# Patient Record
Sex: Female | Born: 1955
Health system: Southern US, Community
[De-identification: ages and names within clinical notes are randomized; demographics above are authoritative.]

## PROBLEM LIST (undated history)

## (undated) DIAGNOSIS — I10 Essential (primary) hypertension: Secondary | ICD-10-CM

## (undated) DIAGNOSIS — C50919 Malignant neoplasm of unspecified site of unspecified female breast: Secondary | ICD-10-CM

## (undated) DIAGNOSIS — M1990 Unspecified osteoarthritis, unspecified site: Secondary | ICD-10-CM

## (undated) DIAGNOSIS — I509 Heart failure, unspecified: Secondary | ICD-10-CM

## (undated) HISTORY — DX: Malignant neoplasm of unspecified site of unspecified female breast: C50.919

## (undated) HISTORY — PX: APPENDECTOMY: SHX54

## (undated) HISTORY — PX: ABDOMINAL HYSTERECTOMY: SHX81

## (undated) HISTORY — PX: COSMETIC SURGERY: SHX468

---

## 1997-07-21 ENCOUNTER — Ambulatory Visit (HOSPITAL_BASED_OUTPATIENT_CLINIC_OR_DEPARTMENT_OTHER): Admission: RE | Admit: 1997-07-21 | Discharge: 1997-07-21 | Payer: Self-pay | Admitting: Otolaryngology

## 1997-08-19 ENCOUNTER — Ambulatory Visit (HOSPITAL_COMMUNITY): Admission: RE | Admit: 1997-08-19 | Discharge: 1997-08-19 | Payer: Self-pay | Admitting: General Surgery

## 1998-05-20 ENCOUNTER — Encounter: Admission: RE | Admit: 1998-05-20 | Discharge: 1998-07-06 | Payer: Self-pay | Admitting: *Deleted

## 1998-06-15 ENCOUNTER — Ambulatory Visit (HOSPITAL_COMMUNITY): Admission: RE | Admit: 1998-06-15 | Discharge: 1998-06-15 | Payer: Self-pay | Admitting: *Deleted

## 1998-07-29 ENCOUNTER — Ambulatory Visit (HOSPITAL_BASED_OUTPATIENT_CLINIC_OR_DEPARTMENT_OTHER): Admission: RE | Admit: 1998-07-29 | Discharge: 1998-07-29 | Payer: Self-pay | Admitting: Orthopedic Surgery

## 1998-08-11 ENCOUNTER — Encounter: Admission: RE | Admit: 1998-08-11 | Discharge: 1998-11-05 | Payer: Self-pay | Admitting: Orthopedic Surgery

## 2000-09-29 ENCOUNTER — Encounter: Payer: Self-pay | Admitting: Family Medicine

## 2000-09-29 ENCOUNTER — Ambulatory Visit (HOSPITAL_COMMUNITY): Admission: RE | Admit: 2000-09-29 | Discharge: 2000-09-29 | Payer: Self-pay | Admitting: Family Medicine

## 2000-12-18 ENCOUNTER — Ambulatory Visit (HOSPITAL_COMMUNITY): Admission: RE | Admit: 2000-12-18 | Discharge: 2000-12-18 | Payer: Self-pay | Admitting: Family Medicine

## 2000-12-18 ENCOUNTER — Encounter: Payer: Self-pay | Admitting: Family Medicine

## 2001-03-13 ENCOUNTER — Other Ambulatory Visit: Admission: RE | Admit: 2001-03-13 | Discharge: 2001-03-13 | Payer: Self-pay | Admitting: *Deleted

## 2001-03-15 ENCOUNTER — Encounter: Payer: Self-pay | Admitting: *Deleted

## 2001-03-15 ENCOUNTER — Encounter: Admission: RE | Admit: 2001-03-15 | Discharge: 2001-03-15 | Payer: Self-pay | Admitting: *Deleted

## 2001-03-19 ENCOUNTER — Encounter: Admission: RE | Admit: 2001-03-19 | Discharge: 2001-03-19 | Payer: Self-pay | Admitting: Family Medicine

## 2001-03-19 ENCOUNTER — Encounter: Payer: Self-pay | Admitting: Family Medicine

## 2001-09-11 ENCOUNTER — Encounter: Payer: Self-pay | Admitting: Emergency Medicine

## 2001-09-11 ENCOUNTER — Emergency Department (HOSPITAL_COMMUNITY): Admission: EM | Admit: 2001-09-11 | Discharge: 2001-09-11 | Payer: Self-pay | Admitting: Emergency Medicine

## 2002-01-31 ENCOUNTER — Encounter: Admission: RE | Admit: 2002-01-31 | Discharge: 2002-01-31 | Payer: Self-pay | Admitting: Family Medicine

## 2002-01-31 ENCOUNTER — Encounter: Payer: Self-pay | Admitting: Family Medicine

## 2003-05-20 ENCOUNTER — Emergency Department (HOSPITAL_COMMUNITY): Admission: EM | Admit: 2003-05-20 | Discharge: 2003-05-20 | Payer: Self-pay | Admitting: Emergency Medicine

## 2003-06-13 ENCOUNTER — Other Ambulatory Visit: Admission: RE | Admit: 2003-06-13 | Discharge: 2003-06-13 | Payer: Self-pay | Admitting: *Deleted

## 2003-07-28 ENCOUNTER — Inpatient Hospital Stay (HOSPITAL_COMMUNITY): Admission: RE | Admit: 2003-07-28 | Discharge: 2003-07-30 | Payer: Self-pay | Admitting: Obstetrics and Gynecology

## 2003-09-18 ENCOUNTER — Ambulatory Visit (HOSPITAL_COMMUNITY): Admission: RE | Admit: 2003-09-18 | Discharge: 2003-09-18 | Payer: Self-pay | Admitting: General Surgery

## 2003-09-19 ENCOUNTER — Ambulatory Visit (HOSPITAL_COMMUNITY): Admission: RE | Admit: 2003-09-19 | Discharge: 2003-09-19 | Payer: Self-pay | Admitting: General Surgery

## 2003-10-08 ENCOUNTER — Encounter: Admission: RE | Admit: 2003-10-08 | Discharge: 2003-10-08 | Payer: Self-pay | Admitting: Obstetrics and Gynecology

## 2010-01-31 ENCOUNTER — Encounter: Payer: Self-pay | Admitting: Obstetrics and Gynecology

## 2010-01-31 ENCOUNTER — Encounter: Payer: Self-pay | Admitting: Urology

## 2010-09-17 ENCOUNTER — Emergency Department (HOSPITAL_BASED_OUTPATIENT_CLINIC_OR_DEPARTMENT_OTHER)
Admission: EM | Admit: 2010-09-17 | Discharge: 2010-09-17 | Disposition: A | Discharge status: Home / Self Care | Payer: 59 | Attending: Emergency Medicine | Admitting: Emergency Medicine

## 2010-09-17 ENCOUNTER — Encounter: Payer: Self-pay | Admitting: *Deleted

## 2010-09-17 DIAGNOSIS — H00019 Hordeolum externum unspecified eye, unspecified eyelid: Secondary | ICD-10-CM | POA: Insufficient documentation

## 2010-09-17 DIAGNOSIS — I10 Essential (primary) hypertension: Secondary | ICD-10-CM | POA: Insufficient documentation

## 2010-09-17 HISTORY — DX: Essential (primary) hypertension: I10

## 2010-09-17 NOTE — ED Provider Notes (Signed)
History     CSN: [id number] Arrival date & time: 09/17/2010 11:32 AM  Chief Complaint  Patient presents with  . Stye   HPI Comments: Pt states that she used avon makeup and had swelling to both eyes:pt states that she then noticed swelling to the right eye  Patient is a 55 y.o. female presenting with eye problem. The history is provided by the patient. No language interpreter was used.  Eye Problem  This is a new problem. The current episode started more than 1 week ago. The problem occurs constantly. The problem has been rapidly improving. There is pain in the right eye. The pain is mild. There is no history of trauma to the eye. There is no known exposure to pink eye. She does not wear contacts. Pertinent negatives include no blurred vision and no discharge. She has tried eye drops for the symptoms.    Past Medical History  Diagnosis Date  . Hypertension     Past Surgical History  Procedure Date  . Abdominal hysterectomy   . Appendectomy   . Cosmetic surgery     mass removed from shoulder/scapula area    History reviewed. No pertinent family history.  History  Substance Use Topics  . Smoking status: Never Smoker   . Smokeless tobacco: Never Used  . Alcohol Use: Yes     ocially    OB History    Grav Para Term Preterm Abortions TAB SAB Ect Mult Living                  Review of Systems  Eyes: Negative for blurred vision and discharge.  All other systems reviewed and are negative.    Physical Exam  There were no vitals taken for this visit.  Physical Exam  Nursing note and vitals reviewed. Constitutional: She appears well-developed and well-nourished.  HENT:  Head: Normocephalic and atraumatic.  Left Ear: External ear normal.  Eyes: Conjunctivae and EOM are normal. Pupils are equal, round, and reactive to light.    Cardiovascular: Normal rate and regular rhythm.   Pulmonary/Chest: Effort normal and breath sounds normal.  Musculoskeletal: Normal range of  motion.    ED Course  Procedures  MDM On exam pt has a stye:no antibiotic needed at this time      Teressa Lower, NP 09/17/10 1225

## 2010-09-17 NOTE — ED Provider Notes (Signed)
Medical screening examination/treatment/procedure(s) were performed by non-physician practitioner and as supervising physician I was immediately available for consultation/collaboration.  Cyndra Numbers, MD 09/17/10 2023

## 2010-09-17 NOTE — ED Notes (Signed)
Patient states she has a sty in both eyes but the the right eye is not getting better like the left eye did. Patient used a wrinkle cream from [place] around her eyes before this started.

## 2013-09-30 ENCOUNTER — Encounter (HOSPITAL_COMMUNITY): Payer: Self-pay | Admitting: Emergency Medicine

## 2013-09-30 ENCOUNTER — Emergency Department (HOSPITAL_COMMUNITY)
Admission: EM | Admit: 2013-09-30 | Discharge: 2013-09-30 | Disposition: A | Discharge status: Home / Self Care | Payer: 59 | Source: Home / Self Care | Attending: Family Medicine | Admitting: Family Medicine

## 2013-09-30 DIAGNOSIS — J309 Allergic rhinitis, unspecified: Secondary | ICD-10-CM

## 2013-09-30 DIAGNOSIS — J45901 Unspecified asthma with (acute) exacerbation: Secondary | ICD-10-CM

## 2013-09-30 DIAGNOSIS — R061 Stridor: Secondary | ICD-10-CM

## 2013-09-30 MED ORDER — ALBUTEROL SULFATE (2.5 MG/3ML) 0.083% IN NEBU
INHALATION_SOLUTION | RESPIRATORY_TRACT | Status: AC
  Filled 2013-09-30: qty 3

## 2013-09-30 MED ORDER — PREDNISONE 50 MG PO TABS: ORAL_TABLET | ORAL | Status: DC

## 2013-09-30 MED ORDER — FLUTICASONE PROPIONATE 50 MCG/ACT NA SUSP: 2.0000 | Freq: Every day | NASAL | Status: DC

## 2013-09-30 MED ORDER — METHYLPREDNISOLONE SODIUM SUCC 125 MG IJ SOLR
125.0000 mg | Freq: Once | INTRAMUSCULAR | Status: AC
  Administered 2013-09-30: 125 mg via INTRAMUSCULAR

## 2013-09-30 MED ORDER — IPRATROPIUM-ALBUTEROL 0.5-2.5 (3) MG/3ML IN SOLN
RESPIRATORY_TRACT | Status: AC
  Filled 2013-09-30: qty 3

## 2013-09-30 MED ORDER — SPACER/AERO-HOLDING CHAMBERS DEVI: Status: AC

## 2013-09-30 MED ORDER — PREDNISONE 50 MG PO TABS
ORAL_TABLET | ORAL | Status: DC
Start: 2013-09-30 — End: 2020-05-20

## 2013-09-30 MED ORDER — ALBUTEROL SULFATE HFA 108 (90 BASE) MCG/ACT IN AERS: 2.0000 | INHALATION_SPRAY | RESPIRATORY_TRACT | Status: DC | PRN

## 2013-09-30 MED ORDER — METHYLPREDNISOLONE SODIUM SUCC 125 MG IJ SOLR
INTRAMUSCULAR | Status: AC
  Filled 2013-09-30: qty 2

## 2013-09-30 MED ORDER — ALBUTEROL SULFATE (2.5 MG/3ML) 0.083% IN NEBU
2.5000 mg | INHALATION_SOLUTION | Freq: Once | RESPIRATORY_TRACT | Status: AC
  Administered 2013-09-30: 2.5 mg via RESPIRATORY_TRACT

## 2013-09-30 MED ORDER — SPACER/AERO-HOLDING CHAMBERS DEVI
Status: DC
Start: 2013-09-30 — End: 2013-09-30

## 2013-09-30 MED ORDER — IPRATROPIUM-ALBUTEROL 0.5-2.5 (3) MG/3ML IN SOLN
3.0000 mL | Freq: Once | RESPIRATORY_TRACT | Status: AC
  Administered 2013-09-30: 3 mL via RESPIRATORY_TRACT

## 2013-09-30 NOTE — ED Notes (Signed)
Reports being around a dog over the weekend, which she has an allergy to animals.  States that Saturday she started with a cough and having headaches.  Mild sob.  No hx of asthma.

## 2013-09-30 NOTE — Discharge Instructions (Signed)
You likely are suffering from an asthma exacerbation or where your lungs are clamping down due to spasm of your airway from the allergens you were exposed too.  You were given a breathing treatment and steroids to help Please continue taking the steroids at home until they are gone Use the albuterol every 4 hours for the next 24-48 hours Please also start using nasal saline and flonase and an over the counter allergy medication such as zyrtec.  Please come back if you are not better.

## 2013-09-30 NOTE — ED Provider Notes (Addendum)
CSN: [id number]     Arrival date & time 09/30/13  7829 History   First MD Initiated Contact with Patient 09/30/13 1009     Chief Complaint  Patient presents with  . Allergies  . Shortness of Breath   (Consider location/radiation/quality/duration/timing/severity/associated sxs/prior Treatment) HPI  Went to friends house w/ dogs and possible mold. Stayed overnight. Started coughing at that time and has not stopped since. Getting worse. alkazeltzer cougha and allergy w/o much improvement. Feels like there is a tighness in her anterior neck that is stopping her from breathing. Associated w/ HA, and chills, and cough, adn post nasal drip. . Denies CP, palpitqations, syncope. Fever, runny nose.    HTN: did not take BP meds this am. Jimmy Picket stressed. Denies CP, palpitations.    Past Medical History  Diagnosis Date  . Hypertension    Past Surgical History  Procedure Laterality Date  . Abdominal hysterectomy    . Appendectomy    . Cosmetic surgery      mass removed from shoulder/scapula area   History reviewed. No pertinent family history. History  Substance Use Topics  . Smoking status: Never Smoker   . Smokeless tobacco: Never Used  . Alcohol Use: Yes     Comment: ocially   OB History   Grav Para Term Preterm Abortions TAB SAB Ect Mult Living                 Review of Systems Per HPI with all other pertinent systems negative.    Allergies  Penicillins  Home Medications   Prior to Admission medications   Medication Sig Start Date End Date Taking? Authorizing Provider  amLODipine (NORVASC) 10 MG tablet Take 10 mg by mouth daily.     Yes Historical Provider, MD  cloNIDine (CATAPRES) 0.1 MG tablet Take 0.1 mg by mouth 2 (two) times daily.     Yes Historical Provider, MD  fish oil-omega-3 fatty acids 1000 MG capsule Take 2 g by mouth daily.     Yes Historical Provider, MD  hydrochlorothiazide 25 MG tablet Take 25 mg by mouth daily.     Yes Historical Provider, MD   lisinopril (PRINIVIL,ZESTRIL) 40 MG tablet Take 40 mg by mouth 2 (two) times daily.     Yes Historical Provider, MD  vitamin C (ASCORBIC ACID) 250 MG tablet Take 250 mg by mouth daily.     Yes Historical Provider, MD  albuterol (PROVENTIL HFA;VENTOLIN HFA) 108 (90 BASE) MCG/ACT inhaler Inhale 2 puffs into the lungs every 4 (four) hours as needed for wheezing or shortness of breath. 09/30/13   Waldemar Dickens, MD  fluticasone (FLONASE) 50 MCG/ACT nasal spray Place 2 sprays into both nostrils daily. 09/30/13   Waldemar Dickens, MD  predniSONE (DELTASONE) 50 MG tablet Take daily with breakfast 09/30/13   Waldemar Dickens, MD  Spacer/Aero-Holding Josiah Lobo DEVI Use as directed 09/30/13   Waldemar Dickens, MD  traMADol Veatrice Bourbon) 50 MG tablet Take 50 mg by mouth every 6 (six) hours as needed.      Historical Provider, MD   BP 138/82  Pulse 86  Temp(Src) 98.3 F (36.8 C) (Oral)  Resp 20  SpO2 100% Physical Exam  Constitutional: She is oriented to person, place, and time. She appears well-developed and well-nourished. She appears distressed (mildly ).  HENT:  Head: Normocephalic and atraumatic.  Eyes: Pupils are equal, round, and reactive to light.  Neck: Normal range of motion.  Cardiovascular: Normal rate and intact distal pulses.  II//VI stystolic murmur  Pulmonary/Chest:  Mild increased WOB.  End exp bilat wheezing Decreased air movement Significant stridor on neck ascultation  Significant Improvement after duoneb and solumedrol  Abdominal: Soft. Bowel sounds are normal.  Musculoskeletal: Normal range of motion. She exhibits no tenderness.  Neurological: She is alert and oriented to person, place, and time.  Skin: Skin is warm. She is not diaphoretic.  Psychiatric: She has a normal mood and affect. Her behavior is normal.    ED Course  Procedures (including critical care time) Labs Review Labs Reviewed - No data to display  Imaging Review No results found.   MDM   1. Asthma,  unspecified asthma severity, with acute exacerbation   2. Multiple respiratory allergies   3. Stridor    Pt w/ significant reactive airway response to likely pet dander and other possible environmental exposures causing current eipsode. No h/o asthma Solumedrol 125mg  IM and duoneb w/ significant improvement in overall condition Start prednisone 50 x5 days Albuterol inh and spacer Q4 x 24-48 hrs then PRN.  Precautions given and all questions answered  Pt to go to ED if not feeling any better in 1-3 hrs.   HTN: at goal today. Continue current regimen  Linna Darner, MD Family Medicine 09/30/2013, 10:49 AM       Waldemar Dickens, MD 09/30/13 1131

## 2014-04-10 ENCOUNTER — Encounter: Payer: Self-pay | Admitting: Nurse Practitioner

## 2014-04-21 ENCOUNTER — Encounter: Payer: Self-pay | Admitting: *Deleted

## 2014-04-23 ENCOUNTER — Encounter: Payer: Self-pay | Admitting: *Deleted

## 2014-04-23 ENCOUNTER — Ambulatory Visit: Payer: 59 | Admitting: Nurse Practitioner

## 2014-04-23 NOTE — Progress Notes (Signed)
Patient ID: Ashley Warren, female   DOB: 12-25-1955, 59 y.o.   MRN: [id number] Called patient today, 04-23-2014 to advise she did not show for her appointment today at 8:30 am.  I left our number and advised she call us if she wants to reschedule.

## 2015-01-21 MED FILL — traMADol HCL 50 MG TABS: 50 | 30 days supply | Qty: 90 | Fill #0

## 2015-01-21 MED FILL — cloNIDine HCL 0.1 MG TABS: 0.1 | 30 days supply | Qty: 120 | Fill #0

## 2015-02-09 MED FILL — VENTOLIN HFA 90 MCG INHALER: 108 (90 BAS | 25 days supply | Qty: 18 | Fill #0

## 2015-02-16 MED FILL — BENZONATATE 100 MG CAPSULE: 100 | 7 days supply | Qty: 30 | Fill #0

## 2015-02-18 MED FILL — traMADol HCL 50 MG TABS: 50 | 90 days supply | Qty: 270 | Fill #0

## 2015-02-26 MED FILL — cloNIDine HCL 0.1 MG TABS: 0.1 | 30 days supply | Qty: 120 | Fill #1

## 2015-03-04 ENCOUNTER — Encounter: Payer: 59 | Admitting: Physical Medicine & Rehabilitation

## 2015-03-05 MED FILL — LISINOPRIL 40 MG TABLET: 40 | 90 days supply | Qty: 180 | Fill #3

## 2015-03-10 MED FILL — TRIAMCINOLONE 0.1% CREAM: 0.1 | 5 days supply | Qty: 15 | Fill #0

## 2015-03-13 MED FILL — VENTOLIN HFA 90 MCG INHALER: 108 (90 BAS | 25 days supply | Qty: 18 | Fill #0

## 2015-03-20 DIAGNOSIS — I10 Essential (primary) hypertension: Secondary | ICD-10-CM | POA: Diagnosis not present

## 2015-03-20 DIAGNOSIS — J4521 Mild intermittent asthma with (acute) exacerbation: Secondary | ICD-10-CM | POA: Diagnosis not present

## 2015-03-20 MED FILL — PANTOPRAZOLE SOD DR 40 MG T: 40 | 90 days supply | Qty: 90 | Fill #2

## 2015-03-20 MED FILL — AZITHROMYCIN 250 MG TABLET: 250 | 5 days supply | Qty: 6 | Fill #0

## 2015-03-20 MED FILL — AMLODIPINE BESYLATE 10 MG T: 10 | 90 days supply | Qty: 90 | Fill #2

## 2015-03-20 MED FILL — predniSONE 50 MG TABS: 50 | 5 days supply | Qty: 5 | Fill #0

## 2015-03-20 MED FILL — SYMBICORT 160-4.5 MCG INH: 160-4.5 | 30 days supply | Qty: 10 | Fill #0

## 2015-03-24 MED FILL — HYDROCHLOROTHIAZIDE 25 MG T: 25 | 90 days supply | Qty: 90 | Fill #0

## 2015-03-30 MED FILL — cloNIDine HCL 0.1 MG TABS: 0.1 | 30 days supply | Qty: 120 | Fill #2

## 2015-04-08 ENCOUNTER — Ambulatory Visit: Payer: 59 | Admitting: Physical Medicine & Rehabilitation

## 2015-04-29 ENCOUNTER — Encounter: Payer: 59 | Attending: Physical Medicine & Rehabilitation | Admitting: Physical Medicine & Rehabilitation

## 2015-04-29 ENCOUNTER — Encounter: Payer: Self-pay | Admitting: Physical Medicine & Rehabilitation

## 2015-04-29 VITALS — BP 158/82 | HR 71 | Resp 14 | Ht 64.0 in | Wt 300.0 lb

## 2015-04-29 DIAGNOSIS — M7061 Trochanteric bursitis, right hip: Secondary | ICD-10-CM | POA: Diagnosis not present

## 2015-04-29 DIAGNOSIS — M545 Low back pain, unspecified: Secondary | ICD-10-CM | POA: Insufficient documentation

## 2015-04-29 DIAGNOSIS — M1288 Other specific arthropathies, not elsewhere classified, other specified site: Secondary | ICD-10-CM | POA: Diagnosis not present

## 2015-04-29 DIAGNOSIS — G894 Chronic pain syndrome: Secondary | ICD-10-CM | POA: Diagnosis not present

## 2015-04-29 DIAGNOSIS — Z5181 Encounter for therapeutic drug level monitoring: Secondary | ICD-10-CM | POA: Diagnosis not present

## 2015-04-29 DIAGNOSIS — M47816 Spondylosis without myelopathy or radiculopathy, lumbar region: Secondary | ICD-10-CM

## 2015-04-29 DIAGNOSIS — Z79899 Other long term (current) drug therapy: Secondary | ICD-10-CM

## 2015-04-29 DIAGNOSIS — M7062 Trochanteric bursitis, left hip: Secondary | ICD-10-CM | POA: Diagnosis not present

## 2015-04-29 DIAGNOSIS — I10 Essential (primary) hypertension: Secondary | ICD-10-CM | POA: Insufficient documentation

## 2015-04-29 NOTE — Patient Instructions (Signed)
  PLEASE CALL ME WITH ANY PROBLEMS OR QUESTIONS (#[phone]).      

## 2015-04-29 NOTE — Progress Notes (Signed)
Subjective:    Patient ID: Ashley Warren, female    DOB: Oct 07, 1955, 60 y.o.   MRN: [id number]  HPI   This is an initial visit for Ashley Warren who presents today with low back pain. She is a morbidly obese 60 yo female who reports developing low back pain about 2+ years ago. She doesn't recall any specific incident or accident. Her pain first presented on her knees which responded to exercise. She went to her family doctor who prescribed robaxin for spasms. She was also given tramadol which helps when she uses it with ibuprofen. This helps 75% of her pain.   Her pain is worst when she stands or if she sits for a long period of times. She feels like it's a  "muscle" type of feeling.  The pain is in the central low back with radiation into both thighs particularly in the posterior thighs. Walking or moving seem to help the pain. Dancing helps as well! She likes to wear high heels at times too!    She likes to dance. 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    Pain Inventory Average Pain 10 Pain Right Now 10 My pain is intermittent, constant, burning and aching  In the last 24 hours, has pain interfered with the following? General activity 10 Relation with others 10 Enjoyment of life 10 What TIME of day is your pain at its worst? all Sleep (in general) Good  Pain is worse with: walking, bending, sitting, standing and some activites Pain improves with: rest, therapy/exercise, pacing activities and medication Relief from Meds: 6  Mobility walk without assistance how many minutes can you walk? 30 ability to climb steps?  yes do you drive?  yes transfers alone Do you have any goals in this area?  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  yes  Neuro/Psych tingling spasms  Prior Studies New  Physicians  involved in your care New   Family History  Problem Relation Age of Onset  . Alcohol abuse Maternal Grandmother   . Autism spectrum disorder Sister   . Breast cancer Mother   . Depression Sister   . Hypertension Mother   . Hypertension Sister   . Osteoarthritis Maternal Grandmother    Social History   Social History  . Marital Status: Married    Spouse Name: N/A  . Number of Children: N/A  . Years of Education: N/A   Social History Main Topics  . Smoking status: Never Smoker   . Smokeless tobacco: Never Used  . Alcohol Use: Yes     Comment: ocially  . Drug Use: No  . Sexual Activity: Not Asked   Other Topics Concern  . None   Social History Narrative   Past Surgical History  Procedure Laterality Date  . Abdominal hysterectomy    . Appendectomy    . Cosmetic surgery      mass removed from shoulder/scapula area   Past Medical History  Diagnosis Date  . Hypertension    BP 158/82 mmHg  Pulse 71  Resp 14  SpO2 97%  Opioid Risk Score:   Fall Risk Score:  `1  Depression screen PHQ 2/9  Depression screen PHQ 2/9 04/29/2015  Decreased Interest 0  Down, Depressed, Hopeless 0  PHQ - 2 Score 0  Altered sleeping 0  Tired, decreased energy 0  Change in appetite 0  Feeling bad or failure about yourself  0  Trouble concentrating 0  Moving slowly or fidgety/restless 0  Suicidal thoughts 0  PHQ-9 Score 0  Difficult doing work/chores (No Data)     Review of Systems  Gastrointestinal: Positive for constipation.  Endocrine:       High blood sugar   All other systems reviewed and are negative.      Objective:   Physical Exam  General: Alert and oriented x 3, No apparent distress. Morbidly obese HEENT: Head is normocephalic, atraumatic, PERRLA, EOMI, sclera anicteric, oral mucosa pink and moist, dentition intact, ext ear canals clear,  Neck: Supple without JVD or lymphadenopathy Heart: Reg rate and rhythm. No murmurs rubs or gallops Chest: CTA  bilaterally without wheezes, rales, or rhonchi; no distress Abdomen: Soft, non-tender, non-distended, bowel sounds positive. Extremities: No clubbing, cyanosis, or edema. Pulses are 2+ Skin: Clean and intact without signs of breakdown  Neuro: Pt is cognitively appropriate with normal insight, memory, and awareness. Cranial nerves 2-12 are intact. Sensory exam is normal. Reflexes are 2+ in all 4's. Fine motor coordination is intact. No tremors. Motor function is grossly 5/5. Gait is fairly normal.  Musculoskeletal: Full ROM, No pain with AROM or PROM in the neck, trunk, or extremities. Posture fairly normal. Does have a slightly exaggerated lumbar lordosis. Low back minimally tender with flexion, rotation or side bending. Extension only caused minimal pain. She had pain which re-produced symptoms with facet provocation. Both greater trochs were tender.  Psych: Pt's affect is appropriate. Pt is cooperative           1. Low back pain most consistent with Lumbar Facet Arthropathy 2. Morbid obesity 3. Bilateral greater trochanter bursitis   Plan: 1. Lumbar xrays to assess facets and disc spaces. 2. Consider referral to PT for facet based exercises/HEP. Basic facet stretches were provided today. 3. Continue ibuprofen--may try taking TID up 2400mg  daily for the short term with food 4. Continue tramadol prn up to 200mg  daily 5. DC robaxin as she's not using 6. Follow up with me in about a month. Forty-five minutes of face to face patient care time were spent during this visit. All questions were encouraged and answered.

## 2015-05-04 MED FILL — cloNIDine HCL 0.1 MG TABS: 0.1 | 90 days supply | Qty: 360 | Fill #0

## 2015-05-05 LAB — TOXASSURE SELECT,+ANTIDEPR,UR: PDF: 0

## 2015-05-07 NOTE — Progress Notes (Signed)
Urine drug screen for this encounter is consistent for prescribed medication 

## 2015-05-15 MED FILL — KLOR-CON M20 TABLET: 20 | 90 days supply | Qty: 180 | Fill #0

## 2015-05-21 MED FILL — traMADol HCL 50 MG TABS: 50 | 90 days supply | Qty: 270 | Fill #0

## 2015-05-26 ENCOUNTER — Encounter: Payer: 59 | Attending: Physical Medicine & Rehabilitation | Admitting: Physical Medicine & Rehabilitation

## 2015-05-26 DIAGNOSIS — I10 Essential (primary) hypertension: Secondary | ICD-10-CM | POA: Insufficient documentation

## 2015-05-26 DIAGNOSIS — M7062 Trochanteric bursitis, left hip: Secondary | ICD-10-CM | POA: Insufficient documentation

## 2015-05-26 DIAGNOSIS — M7061 Trochanteric bursitis, right hip: Secondary | ICD-10-CM | POA: Insufficient documentation

## 2015-05-26 DIAGNOSIS — M545 Low back pain: Secondary | ICD-10-CM | POA: Insufficient documentation

## 2015-05-26 DIAGNOSIS — M1288 Other specific arthropathies, not elsewhere classified, other specified site: Secondary | ICD-10-CM | POA: Insufficient documentation

## 2015-06-15 MED FILL — PANTOPRAZOLE SOD DR 40 MG T: 40 | 90 days supply | Qty: 90 | Fill #3

## 2015-06-15 MED FILL — LISINOPRIL 40 MG TABLET: 40 | 90 days supply | Qty: 180 | Fill #0

## 2015-06-22 MED FILL — HYDROCHLOROTHIAZIDE 25 MG T: 25 | 90 days supply | Qty: 90 | Fill #1

## 2015-06-23 MED FILL — FLUTICASONE PROP 50 MCG SPR: 50 | 90 days supply | Qty: 48 | Fill #0

## 2015-06-30 MED FILL — AMLODIPINE BESYLATE 10 MG T: 10 | 90 days supply | Qty: 90 | Fill #0

## 2015-07-03 DIAGNOSIS — R140 Abdominal distension (gaseous): Secondary | ICD-10-CM | POA: Diagnosis not present

## 2015-07-03 DIAGNOSIS — I10 Essential (primary) hypertension: Secondary | ICD-10-CM | POA: Diagnosis not present

## 2015-07-03 MED FILL — raNITIdine HCL 150 MG TABS: 150 | 15 days supply | Qty: 30 | Fill #0

## 2015-07-08 MED FILL — PRAVASTATIN NA 20 MG TAB: 20 | 30 days supply | Qty: 30 | Fill #0

## 2015-08-03 MED FILL — PRAVASTATIN NA 20 MG TAB: 20 | 30 days supply | Qty: 30 | Fill #1

## 2015-08-03 MED FILL — raNITIdine HCL 150 MG TABS: 150 | 15 days supply | Qty: 30 | Fill #1

## 2015-08-03 MED FILL — cloNIDine HCL 0.1 MG TABS: 0.1 | 90 days supply | Qty: 360 | Fill #1

## 2015-09-09 MED FILL — PRAVASTATIN NA 20 MG TAB: 20 | 30 days supply | Qty: 30 | Fill #2

## 2015-09-09 MED FILL — traMADol HCL 50 MG TABS: 50 | 90 days supply | Qty: 270 | Fill #0

## 2015-09-09 MED FILL — raNITIdine HCL 150 MG TABS: 150 | 30 days supply | Qty: 60 | Fill #0

## 2015-09-09 MED FILL — LISINOPRIL 40 MG TABLET: 40 | 90 days supply | Qty: 180 | Fill #0

## 2015-09-24 MED FILL — HYDROCHLOROTHIAZIDE 25 MG T: 25 | 90 days supply | Qty: 90 | Fill #0

## 2015-09-29 MED FILL — AMLODIPINE BESYLATE 10 MG T: 10 | 90 days supply | Qty: 90 | Fill #1

## 2015-10-12 MED FILL — KLOR-CON M20 TABLET: 20 | 90 days supply | Qty: 180 | Fill #0

## 2015-10-12 MED FILL — PRAVASTATIN NA 20 MG TAB: 20 | 30 days supply | Qty: 30 | Fill #3

## 2015-11-04 MED FILL — TRIAMCINOLONE 0.1% CREAM: 0.1 | 5 days supply | Qty: 15 | Fill #1

## 2015-11-05 MED FILL — cloNIDine HCL 0.1 MG TABS: 0.1 | 90 days supply | Qty: 360 | Fill #0

## 2015-11-09 DIAGNOSIS — E7800 Pure hypercholesterolemia, unspecified: Secondary | ICD-10-CM | POA: Diagnosis not present

## 2015-11-09 DIAGNOSIS — I10 Essential (primary) hypertension: Secondary | ICD-10-CM | POA: Diagnosis not present

## 2015-11-09 DIAGNOSIS — M7062 Trochanteric bursitis, left hip: Secondary | ICD-10-CM | POA: Diagnosis not present

## 2015-12-08 MED FILL — PANTOPRAZOLE SOD DR 40 MG T: 40 | 90 days supply | Qty: 90 | Fill #0

## 2015-12-08 MED FILL — raNITIdine HCL 150 MG TABS: 150 | 15 days supply | Qty: 30 | Fill #0

## 2015-12-21 MED FILL — LISINOPRIL 40 MG TABLET: 40 | 90 days supply | Qty: 180 | Fill #0

## 2015-12-21 MED FILL — HYDROCHLOROTHIAZIDE 25 MG T: 25 | 90 days supply | Qty: 90 | Fill #1

## 2015-12-21 MED FILL — PRAVASTATIN NA 20 MG TAB: 20 | 30 days supply | Qty: 30 | Fill #4

## 2015-12-29 MED FILL — AMLODIPINE BESYLATE 10 MG T: 10 | 90 days supply | Qty: 90 | Fill #2

## 2015-12-30 MED FILL — traMADol HCL 50 MG TABS: 50 | 90 days supply | Qty: 270 | Fill #0

## 2016-01-06 MED FILL — FLUTICASONE PROP 50 MCG SPR: 50 | 90 days supply | Qty: 48 | Fill #0

## 2016-02-08 MED FILL — CloNIDine HCL 0.1 MG TAB: 0.1 | 90 days supply | Qty: 360 | Fill #1

## 2016-02-08 MED FILL — VENTOLIN HFA 90 MCG INHALER: 108 (90 BAS | 25 days supply | Qty: 18 | Fill #1

## 2016-02-24 MED FILL — POTASSIUM CL ER 20 MEQ TAB: 20 | 90 days supply | Qty: 180 | Fill #0

## 2016-02-24 MED FILL — PRAVASTATIN NA 20 MG TAB: 20 | 60 days supply | Qty: 60 | Fill #5

## 2016-03-02 DIAGNOSIS — M5432 Sciatica, left side: Secondary | ICD-10-CM | POA: Diagnosis not present

## 2016-03-02 DIAGNOSIS — Z1159 Encounter for screening for other viral diseases: Secondary | ICD-10-CM | POA: Diagnosis not present

## 2016-03-02 DIAGNOSIS — M5431 Sciatica, right side: Secondary | ICD-10-CM | POA: Diagnosis not present

## 2016-03-02 DIAGNOSIS — E559 Vitamin D deficiency, unspecified: Secondary | ICD-10-CM | POA: Diagnosis not present

## 2016-03-02 DIAGNOSIS — I10 Essential (primary) hypertension: Secondary | ICD-10-CM | POA: Diagnosis not present

## 2016-03-02 DIAGNOSIS — E7800 Pure hypercholesterolemia, unspecified: Secondary | ICD-10-CM | POA: Diagnosis not present

## 2016-03-02 DIAGNOSIS — R7309 Other abnormal glucose: Secondary | ICD-10-CM | POA: Diagnosis not present

## 2016-03-02 MED FILL — IBUPROFEN 800 MG TABLET: 800 | 30 days supply | Qty: 60 | Fill #0

## 2016-03-10 MED FILL — VIT D2 1.25 MG (50,000 UNIT: 1.25 MG | 28 days supply | Qty: 8 | Fill #0

## 2016-03-17 ENCOUNTER — Other Ambulatory Visit (HOSPITAL_COMMUNITY): Payer: Self-pay | Admitting: Nurse Practitioner

## 2016-03-17 ENCOUNTER — Ambulatory Visit (HOSPITAL_COMMUNITY)
Admission: RE | Admit: 2016-03-17 | Discharge: 2016-03-17 | Disposition: A | Discharge status: Home / Self Care | Payer: 59 | Source: Ambulatory Visit | Attending: Nurse Practitioner | Admitting: Nurse Practitioner

## 2016-03-17 DIAGNOSIS — M5136 Other intervertebral disc degeneration, lumbar region: Secondary | ICD-10-CM | POA: Insufficient documentation

## 2016-03-17 DIAGNOSIS — M47816 Spondylosis without myelopathy or radiculopathy, lumbar region: Secondary | ICD-10-CM | POA: Diagnosis not present

## 2016-03-17 DIAGNOSIS — M5431 Sciatica, right side: Secondary | ICD-10-CM | POA: Insufficient documentation

## 2016-03-17 DIAGNOSIS — M5432 Sciatica, left side: Secondary | ICD-10-CM | POA: Diagnosis not present

## 2016-03-23 MED FILL — LISINOPRIL 40 MG TABLET: 40 | 90 days supply | Qty: 180 | Fill #0

## 2016-03-23 MED FILL — TRIAMCINOLONE 0.1% CREAM: 0.1 | 5 days supply | Qty: 15 | Fill #0

## 2016-03-23 MED FILL — AMLODIPINE BESYLATE 10 MG T: 10 | 90 days supply | Qty: 90 | Fill #0

## 2016-04-04 MED FILL — PANTOPRAZOLE SOD DR 40 MG T: 40 | 90 days supply | Qty: 90 | Fill #1

## 2016-05-03 MED FILL — CloNIDine HCL 0.1 MG TAB: 0.1 | 90 days supply | Qty: 360 | Fill #2

## 2016-05-04 MED FILL — traMADol HCL 50 MG TABS: 50 | 90 days supply | Qty: 270 | Fill #0

## 2016-05-04 MED FILL — PRAVASTATIN NA 20 MG TAB: 20 | 90 days supply | Qty: 90 | Fill #0

## 2016-05-26 ENCOUNTER — Ambulatory Visit: Payer: 59 | Attending: Nurse Practitioner | Admitting: Physical Therapy

## 2016-05-26 DIAGNOSIS — M5441 Lumbago with sciatica, right side: Secondary | ICD-10-CM | POA: Insufficient documentation

## 2016-05-26 DIAGNOSIS — M5442 Lumbago with sciatica, left side: Secondary | ICD-10-CM | POA: Insufficient documentation

## 2016-05-26 DIAGNOSIS — R293 Abnormal posture: Secondary | ICD-10-CM | POA: Diagnosis not present

## 2016-05-26 NOTE — Patient Instructions (Signed)
PelvicTilt: Posterior - Legs Bent (Supine)     Tighten stomach and flatten back by rolling pelvis down. Hold __5-10__ seconds. Relax. Repeat __15__ times per set. Do __2__ sets per session. **lying down, seated, standing**   Single Knee to chest with towel    With towel behind knee, pull knee to chest gently - hold 15-30 seconds, 3-5 times   Lower lumbar rotation    With knees bent - gently rock side to side 10-15 times   PELVIC STABILIZATION: Basic Bridge    Exhaling, lift hips. Hold for _5__ breaths. Exhaling, release hips back to floor. Repeat _10-15__ times.

## 2016-05-26 NOTE — Therapy (Addendum)
[facility] [address]  [place], [place], [address] Phone: [phone]   Fax:  [phone]  Physical Therapy Evaluation  Patient Details  Name: Ashley Warren MRN: [id number] Date of Birth: April 30, 1955 Referring Provider: Berkley Harvey, FNP  Encounter Date: 05/26/2016      PT End of Session - 05/26/16 0846    Visit Number 1   Number of Visits 16   Date for PT Re-Evaluation 07/21/16   PT Start Time 0808   PT Stop Time 0846   PT Time Calculation (min) 38 min   Activity Tolerance Patient tolerated treatment well   Behavior During Therapy [facility] for tasks assessed/performed      Past Medical History:  Diagnosis Date  . Hypertension     Past Surgical History:  Procedure Laterality Date  . ABDOMINAL HYSTERECTOMY    . APPENDECTOMY    . COSMETIC SURGERY     mass removed from shoulder/scapula area    There were no vitals filed for this visit.       Subjective Assessment - 05/26/16 0808    Subjective Patient reporting history of pain for approx 1-2 years ago. Pain is in lower back and goes into B feet. Reports known arthritis that affects her knees. Cannot explain pain in her back - no pain with sitting. Does not feel like its bone or muscle related - feels like its her nerves. Feels like it burns - has to change positions. Cannont wear any shoes with heels due to pain. Reports she takes 3 Tramadol, and 8 ibuprofen with little relief   Pertinent History HTN   Limitations Standing;Walking   Diagnostic tests Xray:   Patient Stated Goals relieve pain   Currently in Pain? Yes   Pain Score 4    Pain Location Back   Pain Orientation Right;Left;Mid;Lower   Pain Descriptors / Indicators Burning;Aching;Discomfort   Pain Type Chronic pain   Pain Onset More than a month ago   Pain Frequency Constant   Aggravating Factors  standing, walking   Pain Relieving Factors ice            [facility] PT Assessment -  05/26/16 0819      Assessment   Medical Diagnosis Low back pain with sciatica   Referring Provider Berkley Harvey, FNP   Onset Date/Surgical Date --  1-2 years ago per patient report   Next MD Visit prn   Prior Therapy yes - not for this issue     Precautions   Precautions None     Restrictions   Weight Bearing Restrictions No     Balance Screen   Has the patient fallen in the past 6 months No   Has the patient had a decrease in activity level because of a fear of falling?  No   Is the patient reluctant to leave their home because of a fear of falling?  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   Leisure sleep, sedentary lifestyle     Cognition   Overall Cognitive Status Within Functional Limits for tasks assessed     Observation/Other Assessments   Focus on Therapeutic Outcomes (FOTO)  Lumbar Spine: 59 (41% limited, predicted 36% limited)     Sensation   Light Touch Appears Intact  Coordination   Gross Motor Movements are Fluid and Coordinated Yes     Posture/Postural Control   Posture/Postural Control Postural limitations   Postural Limitations Rounded Shoulders;Forward head;Increased lumbar lordosis     ROM / Strength   AROM / PROM / Strength AROM;Strength     AROM   Overall AROM  Within functional limits for tasks performed   Overall AROM Comments Some pain with all motions, as well as when returning to neutral   AROM Assessment Site Lumbar     Strength   Strength Assessment Site Hip;Knee   Right/Left Hip Right;Left   Right Hip Flexion 4/5   Left Hip Flexion 4/5   Right/Left Knee Right;Left   Right Knee Flexion 4+/5   Right Knee Extension 4+/5   Left Knee Flexion 4+/5   Left Knee Extension 4+/5     Flexibility   Soft Tissue Assessment /Muscle Length yes   Hamstrings mild tightness B                            PT Education - 05/26/16 0842    Education provided Yes   Education Details exam findings, POC, HEP   Person(s) Educated Patient   Methods Explanation;Demonstration;Handout   Comprehension Verbalized understanding;Returned demonstration          PT Short Term Goals - 05/26/16 1118      PT SHORT TERM GOAL #1   Title Patient to be independent with HEP (06/23/16)   Status New     PT SHORT TERM GOAL #2   Title Patient to demonstrate/verbalize good posture and body mechanics as it relates to her daily activities (06/23/16)   Status New           PT Long Term Goals - 05/26/16 1119      PT LONG TERM GOAL #1   Title Patient to be independent with advanced HEP (07/21/16)   Status New     PT LONG TERM GOAL #2   Title Patient to report ability to initiate and maintian walking program for cardiovascular benefit without back pain limiting (07/21/16)   Status New     PT LONG TERM GOAL #3   Title Patient demonstrate good/appropriate core activation with all static and dynmaic tasks (07/21/16)   Status New     PT LONG TERM GOAL #4   Title Patient to report pain reduction by >/= 50% (07/21/16)   Status New               Plan - 05/26/16 1115    Clinical Impression Statement Ashley Warren is a 61 y/o female presenting to [facility] today for initial evaluation regaridng primary complaints of low back pain with pain radiation into B LE affecting her daily activities. Patient reporitng pain as burning and constant in nature, as well as inability to walk or stand for prolonged periods. Patient today with lumbar AROM WNL however, pain with all motions as well as when returning to a neutral posture. Patient to benefit from PT intervetnion to address low back pain as well as functional limitations related to the back pain for improved quality of life.    Rehab Potential Good   PT Frequency 2x / week   PT Duration 8 weeks   PT Treatment/Interventions  ADLs/Self Care Home Management;Cryotherapy;Electrical Stimulation;Moist Heat;Traction;Ultrasound;Neuromuscular re-education;Balance training;Therapeutic exercise;Therapeutic activities;Functional mobility training;Stair training;Gait training;Patient/family education;Manual techniques;Passive range of motion;Vasopneumatic Device;Dry needling   Consulted and Agree with Plan of Care Patient  Patient will benefit from skilled therapeutic intervention in order to improve the following deficits and impairments:  Decreased activity tolerance, Decreased mobility, Decreased strength, Difficulty walking, Improper body mechanics, Pain  Visit Diagnosis: Bilateral low back pain with bilateral sciatica, unspecified chronicity - Plan: PT plan of care cert/re-cert  Abnormal posture - Plan: PT plan of care cert/re-cert     Problem List Patient Active Problem List   Diagnosis Date Noted  . Low back pain 04/29/2015  . Lumbar facet arthropathy ([place]) 04/29/2015  . Trochanteric bursitis of both hips 04/29/2015  . Morbid obesity ([place]) 04/29/2015     Lanney Gins, PT, DPT 05/26/16 11:31 AM  PHYSICAL THERAPY DISCHARGE SUMMARY  Visits from Start of Care: 1  Current functional level related to goals / functional outcomes: See above - no progress able to be noted as patient only came to initial evaluation   Remaining deficits: See above   Education / Equipment: HEP  Plan: Patient agrees to discharge.  Patient goals were not met. Patient is being discharged due to not returning since the last visit.  ?????     Lanney Gins, PT, DPT 06/27/16 8:17 AM  [facility] [place], [place], [address] Phone: [phone]   Fax:  [phone]  Name: Ashley Warren MRN: [id number] Date of Birth: 1955-09-15

## 2016-06-27 MED FILL — AMLODIPINE BESYLATE 10 MG T: 10 | 90 days supply | Qty: 90 | Fill #1

## 2016-06-27 MED FILL — LISINOPRIL 40 MG TABLET: 40 | 30 days supply | Qty: 60 | Fill #0

## 2016-07-26 MED FILL — POTASSIUM CL ER 20 MEQ TAB: 20 | 90 days supply | Qty: 180 | Fill #0

## 2016-07-26 MED FILL — PANTOPRAZOLE SOD DR 40 MG T: 40 | 90 days supply | Qty: 90 | Fill #0

## 2016-08-01 DIAGNOSIS — M4696 Unspecified inflammatory spondylopathy, lumbar region: Secondary | ICD-10-CM | POA: Diagnosis not present

## 2016-08-01 DIAGNOSIS — E559 Vitamin D deficiency, unspecified: Secondary | ICD-10-CM | POA: Diagnosis not present

## 2016-08-01 DIAGNOSIS — Z0000 Encounter for general adult medical examination without abnormal findings: Secondary | ICD-10-CM | POA: Diagnosis not present

## 2016-08-01 DIAGNOSIS — I10 Essential (primary) hypertension: Secondary | ICD-10-CM | POA: Diagnosis not present

## 2016-08-01 DIAGNOSIS — E7800 Pure hypercholesterolemia, unspecified: Secondary | ICD-10-CM | POA: Diagnosis not present

## 2016-08-01 DIAGNOSIS — Z1211 Encounter for screening for malignant neoplasm of colon: Secondary | ICD-10-CM | POA: Diagnosis not present

## 2016-08-01 MED FILL — TRIAMCINOLONE 0.1% CREAM: 0.1 | 7 days supply | Qty: 15 | Fill #0

## 2016-08-01 MED FILL — DICLOFENAC SODIUM 1% GEL: 1 | 6 days supply | Qty: 100 | Fill #0

## 2016-08-01 MED FILL — PRAVASTATIN NA 20 MG TAB: 20 | 90 days supply | Qty: 90 | Fill #0

## 2016-08-01 MED FILL — cloNIDine HCL 0.2 MG TABS: 0.2 | 90 days supply | Qty: 180 | Fill #0

## 2016-08-01 MED FILL — LISINOPRIL 40 MG TABLET: 40 | 90 days supply | Qty: 180 | Fill #0

## 2016-08-01 MED FILL — HYDROCHLOROTHIAZIDE 25 MG T: 25 | 90 days supply | Qty: 90 | Fill #0

## 2016-08-02 MED FILL — traMADol HCL 50 MG TABS: 50 | 90 days supply | Qty: 270 | Fill #0

## 2016-08-03 ENCOUNTER — Other Ambulatory Visit (HOSPITAL_COMMUNITY): Payer: Self-pay | Admitting: Nurse Practitioner

## 2016-08-03 DIAGNOSIS — M47816 Spondylosis without myelopathy or radiculopathy, lumbar region: Secondary | ICD-10-CM

## 2016-08-08 ENCOUNTER — Encounter (HOSPITAL_COMMUNITY): Payer: Self-pay

## 2016-08-08 ENCOUNTER — Ambulatory Visit (HOSPITAL_COMMUNITY)
Admission: RE | Admit: 2016-08-08 | Discharge: 2016-08-08 | Disposition: A | Discharge status: Home / Self Care | Payer: 59 | Source: Ambulatory Visit | Attending: Nurse Practitioner | Admitting: Nurse Practitioner

## 2016-10-04 DIAGNOSIS — R2242 Localized swelling, mass and lump, left lower limb: Secondary | ICD-10-CM | POA: Diagnosis not present

## 2016-10-04 DIAGNOSIS — M2550 Pain in unspecified joint: Secondary | ICD-10-CM | POA: Diagnosis not present

## 2016-10-04 DIAGNOSIS — R600 Localized edema: Secondary | ICD-10-CM | POA: Diagnosis not present

## 2016-10-04 DIAGNOSIS — D171 Benign lipomatous neoplasm of skin and subcutaneous tissue of trunk: Secondary | ICD-10-CM | POA: Diagnosis not present

## 2016-10-04 MED FILL — FUROSEMIDE 20 MG TAB: 20 | 15 days supply | Qty: 15 | Fill #0

## 2016-10-04 MED FILL — DICLOFENAC SODIUM 1% GEL: 1 | 24 days supply | Qty: 300 | Fill #0

## 2016-10-05 MED FILL — AMLODIPINE BESYLATE 10 MG T: 10 | 90 days supply | Qty: 90 | Fill #2

## 2016-10-21 DIAGNOSIS — R600 Localized edema: Secondary | ICD-10-CM | POA: Diagnosis not present

## 2016-10-21 DIAGNOSIS — R2242 Localized swelling, mass and lump, left lower limb: Secondary | ICD-10-CM | POA: Diagnosis not present

## 2016-10-21 DIAGNOSIS — H2513 Age-related nuclear cataract, bilateral: Secondary | ICD-10-CM | POA: Diagnosis not present

## 2016-10-21 DIAGNOSIS — H35363 Drusen (degenerative) of macula, bilateral: Secondary | ICD-10-CM | POA: Diagnosis not present

## 2016-10-21 DIAGNOSIS — H35033 Hypertensive retinopathy, bilateral: Secondary | ICD-10-CM | POA: Diagnosis not present

## 2016-10-21 DIAGNOSIS — H40023 Open angle with borderline findings, high risk, bilateral: Secondary | ICD-10-CM | POA: Diagnosis not present

## 2016-10-26 ENCOUNTER — Ambulatory Visit: Payer: Self-pay | Admitting: Surgery

## 2016-10-26 DIAGNOSIS — M799 Soft tissue disorder, unspecified: Secondary | ICD-10-CM | POA: Diagnosis not present

## 2016-11-01 MED FILL — TRIAMCINOLONE ACETONIDE 0.1: 0.1 | 7 days supply | Qty: 15 | Fill #1

## 2016-11-01 MED FILL — cloNIDine HCL 0.2 MG TABS: 0.2 | 90 days supply | Qty: 180 | Fill #1

## 2016-11-01 MED FILL — PRAVASTATIN NA 20 MG TAB: 20 | 90 days supply | Qty: 90 | Fill #1

## 2016-11-01 MED FILL — LISINOPRIL 40 MG TABLET: 40 | 90 days supply | Qty: 180 | Fill #1

## 2016-11-01 MED FILL — HYDROCHLOROTHIAZIDE 25 MG T: 25 | 90 days supply | Qty: 90 | Fill #1

## 2016-11-01 MED FILL — traMADol HCL 50 MG TABS: 50 | 90 days supply | Qty: 270 | Fill #0

## 2016-11-04 MED FILL — PANTOPRAZOLE SOD DR 40 MG T: 40 | 90 days supply | Qty: 90 | Fill #2

## 2016-11-09 ENCOUNTER — Other Ambulatory Visit (HOSPITAL_COMMUNITY): Payer: 59

## 2016-11-09 DIAGNOSIS — M1712 Unilateral primary osteoarthritis, left knee: Secondary | ICD-10-CM | POA: Diagnosis not present

## 2016-11-09 DIAGNOSIS — M1711 Unilateral primary osteoarthritis, right knee: Secondary | ICD-10-CM | POA: Diagnosis not present

## 2016-11-09 DIAGNOSIS — M25561 Pain in right knee: Secondary | ICD-10-CM | POA: Diagnosis not present

## 2016-11-09 DIAGNOSIS — M545 Low back pain: Secondary | ICD-10-CM | POA: Diagnosis not present

## 2016-11-09 DIAGNOSIS — M25562 Pain in left knee: Secondary | ICD-10-CM | POA: Diagnosis not present

## 2016-11-10 ENCOUNTER — Ambulatory Visit: Admit: 2016-11-10 | Payer: 59 | Admitting: Surgery

## 2016-11-10 SURGERY — EXCISION MASS
Anesthesia: General

## 2016-12-05 MED FILL — AZITHROMYCIN 500 MG TABLET: 500 | 3 days supply | Qty: 3 | Fill #0

## 2016-12-05 MED FILL — VENTOLIN HFA 90 MCG INHALER: 108 (90 BAS | 25 days supply | Qty: 18 | Fill #0

## 2016-12-05 MED FILL — ATOVAQUONE-PROGUANIL 250-10: 250-100 | 47 days supply | Qty: 47 | Fill #0

## 2016-12-05 MED FILL — hydrOXYzine HCL 25 MG TABS: 25 | 7 days supply | Qty: 30 | Fill #0

## 2016-12-12 DIAGNOSIS — M2550 Pain in unspecified joint: Secondary | ICD-10-CM | POA: Diagnosis not present

## 2016-12-12 DIAGNOSIS — R600 Localized edema: Secondary | ICD-10-CM | POA: Diagnosis not present

## 2016-12-12 DIAGNOSIS — J329 Chronic sinusitis, unspecified: Secondary | ICD-10-CM | POA: Diagnosis not present

## 2016-12-12 DIAGNOSIS — R7303 Prediabetes: Secondary | ICD-10-CM | POA: Diagnosis not present

## 2016-12-12 DIAGNOSIS — R05 Cough: Secondary | ICD-10-CM | POA: Diagnosis not present

## 2016-12-12 DIAGNOSIS — F419 Anxiety disorder, unspecified: Secondary | ICD-10-CM | POA: Diagnosis not present

## 2016-12-12 DIAGNOSIS — I10 Essential (primary) hypertension: Secondary | ICD-10-CM | POA: Diagnosis not present

## 2016-12-12 MED FILL — DOXYCYCLINE HYCLATE 100 MG: 100 | 10 days supply | Qty: 20 | Fill #0

## 2016-12-12 MED FILL — DICLOFENAC SODIUM 1% GEL: 1 | 25 days supply | Qty: 300 | Fill #0

## 2016-12-12 MED FILL — POTASSIUM CL ER 20 MEQ TAB: 20 | 90 days supply | Qty: 180 | Fill #0

## 2016-12-12 MED FILL — FLUTICASONE PROP 50 MCG SPR: 50 | 60 days supply | Qty: 32 | Fill #0

## 2016-12-12 MED FILL — FUROSEMIDE 20 MG TABLET: 20 | 15 days supply | Qty: 15 | Fill #0

## 2016-12-12 MED FILL — SYMBICORT 160-4.5 MCG INH: 160-4.5 | 90 days supply | Qty: 31 | Fill #0

## 2016-12-12 MED FILL — IBUPROFEN 800 MG TAB: 800 | 30 days supply | Qty: 60 | Fill #0

## 2016-12-12 MED FILL — TRIAMCINOLONE ACETONIDE 0.1: 0.1 | 20 days supply | Qty: 80 | Fill #0

## 2016-12-12 MED FILL — hydrOXYzine HCL 25 MG TABS: 25 | 15 days supply | Qty: 60 | Fill #0

## 2016-12-13 MED FILL — VENTOLIN HFA 90 MCG INHALER: 108 (90 BAS | 25 days supply | Qty: 18 | Fill #0

## 2016-12-13 MED FILL — AMLODIPINE BESYLATE 10 MG T: 10 | 30 days supply | Qty: 30 | Fill #0

## 2017-02-06 MED FILL — PANTOPRAZOLE SOD DR 40 MG T: 40 | 90 days supply | Qty: 90 | Fill #0

## 2017-02-06 MED FILL — AMLODIPINE BESYLATE 10 MG T: 10 | 90 days supply | Qty: 90 | Fill #1

## 2017-02-06 MED FILL — LISINOPRIL 40 MG TABLET: 40 | 90 days supply | Qty: 180 | Fill #0

## 2017-02-06 MED FILL — HYDROCHLOROTHIAZIDE 25 MG T: 25 | 90 days supply | Qty: 90 | Fill #0

## 2017-02-24 MED FILL — cloNIDine HCL 0.2 MG TABS: 0.2 | 90 days supply | Qty: 180 | Fill #2

## 2017-03-14 MED FILL — DICLOFENAC SODIUM 1% GEL: 1 | 25 days supply | Qty: 300 | Fill #1

## 2017-03-14 MED FILL — IBUPROFEN 800 MG TAB: 800 | 30 days supply | Qty: 60 | Fill #1

## 2017-03-14 MED FILL — traMADol HCL 50 MG TABS: 50 | 30 days supply | Qty: 90 | Fill #0

## 2017-03-14 MED FILL — PRAVASTATIN SODIUM 20 MG TA: 20 | 30 days supply | Qty: 30 | Fill #0

## 2017-04-18 MED FILL — TRIAMCINOLONE ACETONIDE 0.1: 0.1 | 20 days supply | Qty: 80 | Fill #0

## 2017-04-19 MED FILL — traMADol HCL 50 MG TABS: 50 | 30 days supply | Qty: 90 | Fill #0

## 2017-04-20 MED FILL — PRAVASTATIN SODIUM 20 MG TA: 20 | 90 days supply | Qty: 90 | Fill #1

## 2017-05-02 MED FILL — HYDROCHLOROTHIAZIDE 25 MG T: 25 | 90 days supply | Qty: 90 | Fill #1

## 2017-05-02 MED FILL — AMLODIPINE BESYLATE 10 MG T: 10 | 90 days supply | Qty: 90 | Fill #2

## 2017-05-09 DIAGNOSIS — R7303 Prediabetes: Secondary | ICD-10-CM | POA: Diagnosis not present

## 2017-05-09 DIAGNOSIS — E559 Vitamin D deficiency, unspecified: Secondary | ICD-10-CM | POA: Diagnosis not present

## 2017-05-09 DIAGNOSIS — I10 Essential (primary) hypertension: Secondary | ICD-10-CM | POA: Diagnosis not present

## 2017-05-09 DIAGNOSIS — M2550 Pain in unspecified joint: Secondary | ICD-10-CM | POA: Diagnosis not present

## 2017-05-09 MED FILL — DICLOFENAC SODIUM 1% GEL: 1 | 41 days supply | Qty: 500 | Fill #0

## 2017-05-09 MED FILL — LISINOPRIL 40 MG TABLET: 40 | 90 days supply | Qty: 180 | Fill #0

## 2017-05-09 MED FILL — SYMBICORT 160-4.5 MCG INH: 160-4.5 | 90 days supply | Qty: 31 | Fill #0

## 2017-05-22 MED FILL — traMADol HCL 50 MG TABS: 50 | 30 days supply | Qty: 90 | Fill #0

## 2017-05-25 MED FILL — PANTOPRAZOLE SOD DR 40 MG T: 40 | 90 days supply | Qty: 90 | Fill #0

## 2017-06-14 MED FILL — cloNIDine HCL 0.2 MG TABS: 0.2 | 90 days supply | Qty: 180 | Fill #3

## 2017-07-21 MED FILL — PRAVASTATIN SODIUM 20 MG TA: 20 | 90 days supply | Qty: 90 | Fill #2

## 2017-07-21 MED FILL — traMADol HCL 50 MG TABS: 50 | 30 days supply | Qty: 90 | Fill #0

## 2017-07-24 MED FILL — TRIAMCINOLONE 0.1% CREAM: 0.1 | 20 days supply | Qty: 80 | Fill #0

## 2017-07-25 MED FILL — POTASSIUM CL ER 20 MEQ TABL: 20 | 90 days supply | Qty: 180 | Fill #0

## 2017-08-04 MED FILL — AMLODIPINE BESYLATE 10 MG T: 10 | 90 days supply | Qty: 90 | Fill #0

## 2017-08-07 MED FILL — LISINOPRIL 40 MG TABLET: 40 | 90 days supply | Qty: 180 | Fill #0

## 2017-08-10 MED FILL — HYDROCHLOROTHIAZIDE 25 MG T: 25 | 90 days supply | Qty: 90 | Fill #0

## 2017-09-04 MED FILL — cloNIDine HCL 0.2 MG TABS: 0.2 | 90 days supply | Qty: 180 | Fill #0

## 2017-09-04 MED FILL — traMADol HCL 50 MG TABS: 50 | 30 days supply | Qty: 90 | Fill #0

## 2017-09-04 MED FILL — PANTOPRAZOLE SOD DR 40 MG T: 40 | 90 days supply | Qty: 90 | Fill #0

## 2017-10-16 MED FILL — FLUTICASONE PROP 50 MCG SPR: 50 | 30 days supply | Qty: 16 | Fill #1

## 2017-10-16 MED FILL — PRAVASTATIN SODIUM 20 MG TA: 20 | 90 days supply | Qty: 90 | Fill #3

## 2017-10-17 MED FILL — traMADol HCL 50 MG TABS: 50 | 30 days supply | Qty: 90 | Fill #0

## 2017-10-17 MED FILL — TRIAMCINOLONE 0.1% CREAM: 0.1 | 20 days supply | Qty: 80 | Fill #0

## 2017-10-28 IMAGING — DX DG LUMBAR SPINE 2-3V
3 series · 3 of 3 positions shown · non-contrast
Comparison: None.

CLINICAL DATA: Bilateral sciatica for several months, no known
injury, initial encounter

EXAM:
LUMBAR SPINE - 3 VIEW

[l-spine ap]
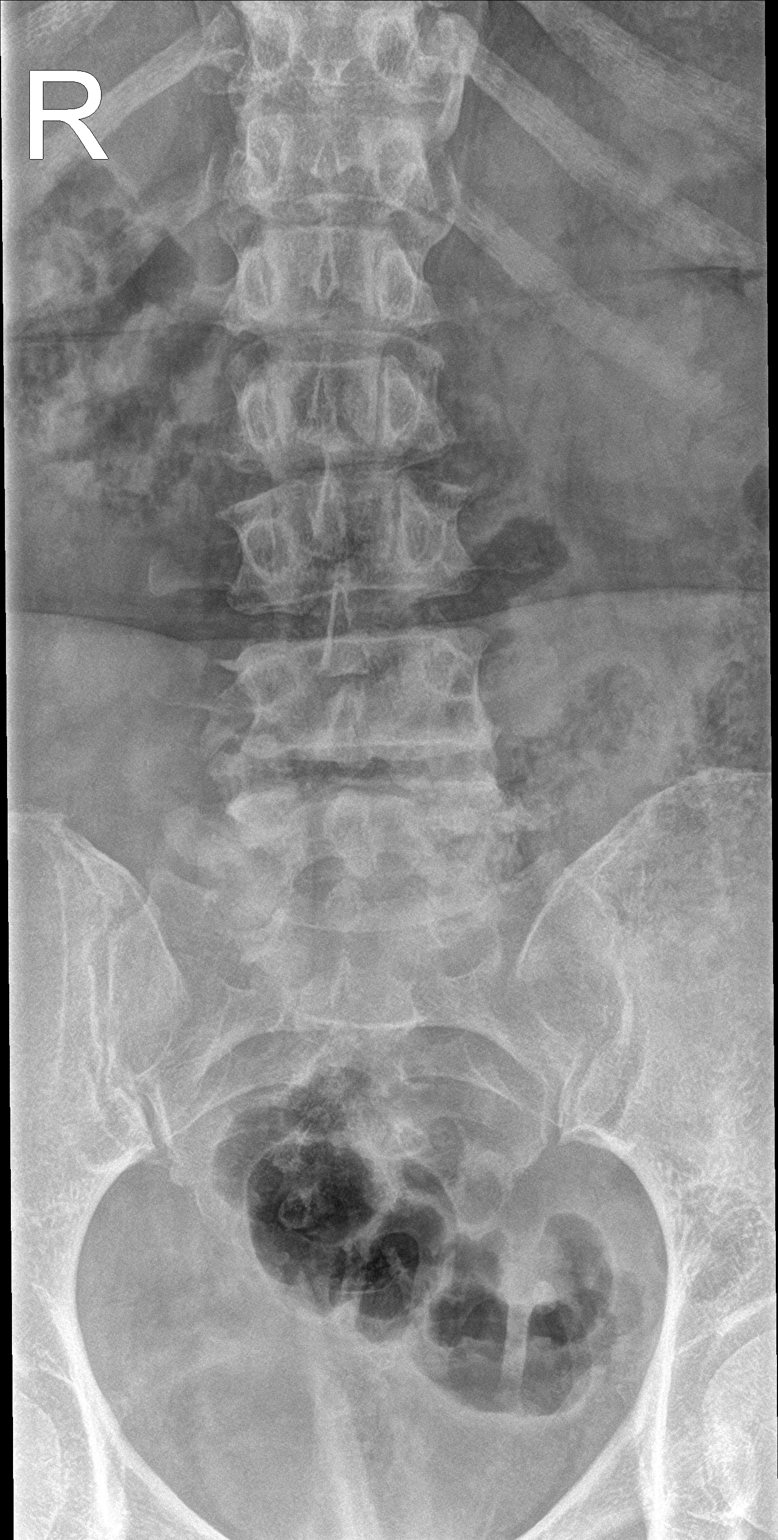

[l-spine lat]
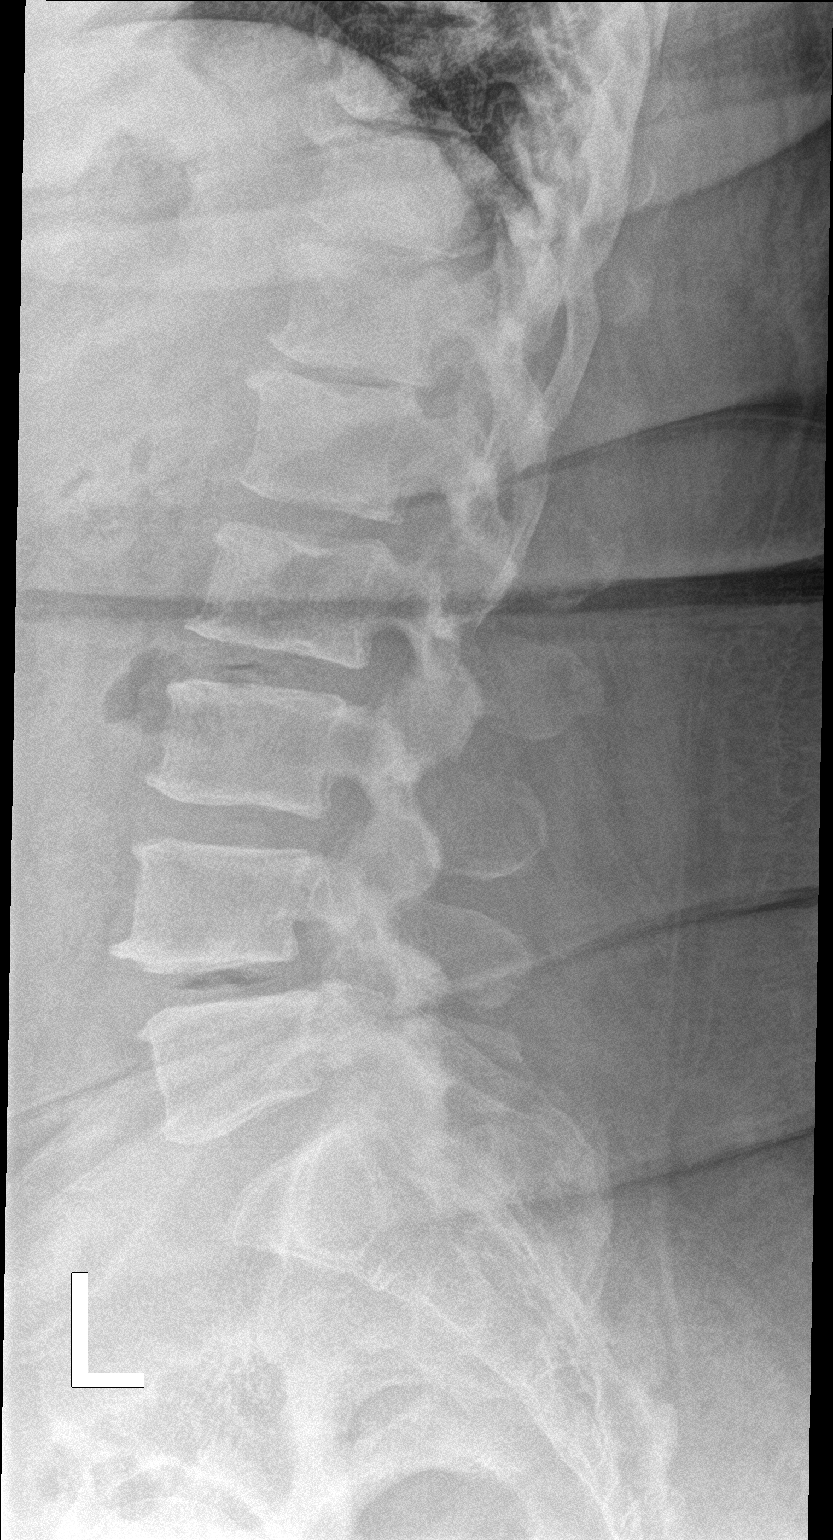

[l-spine spot]
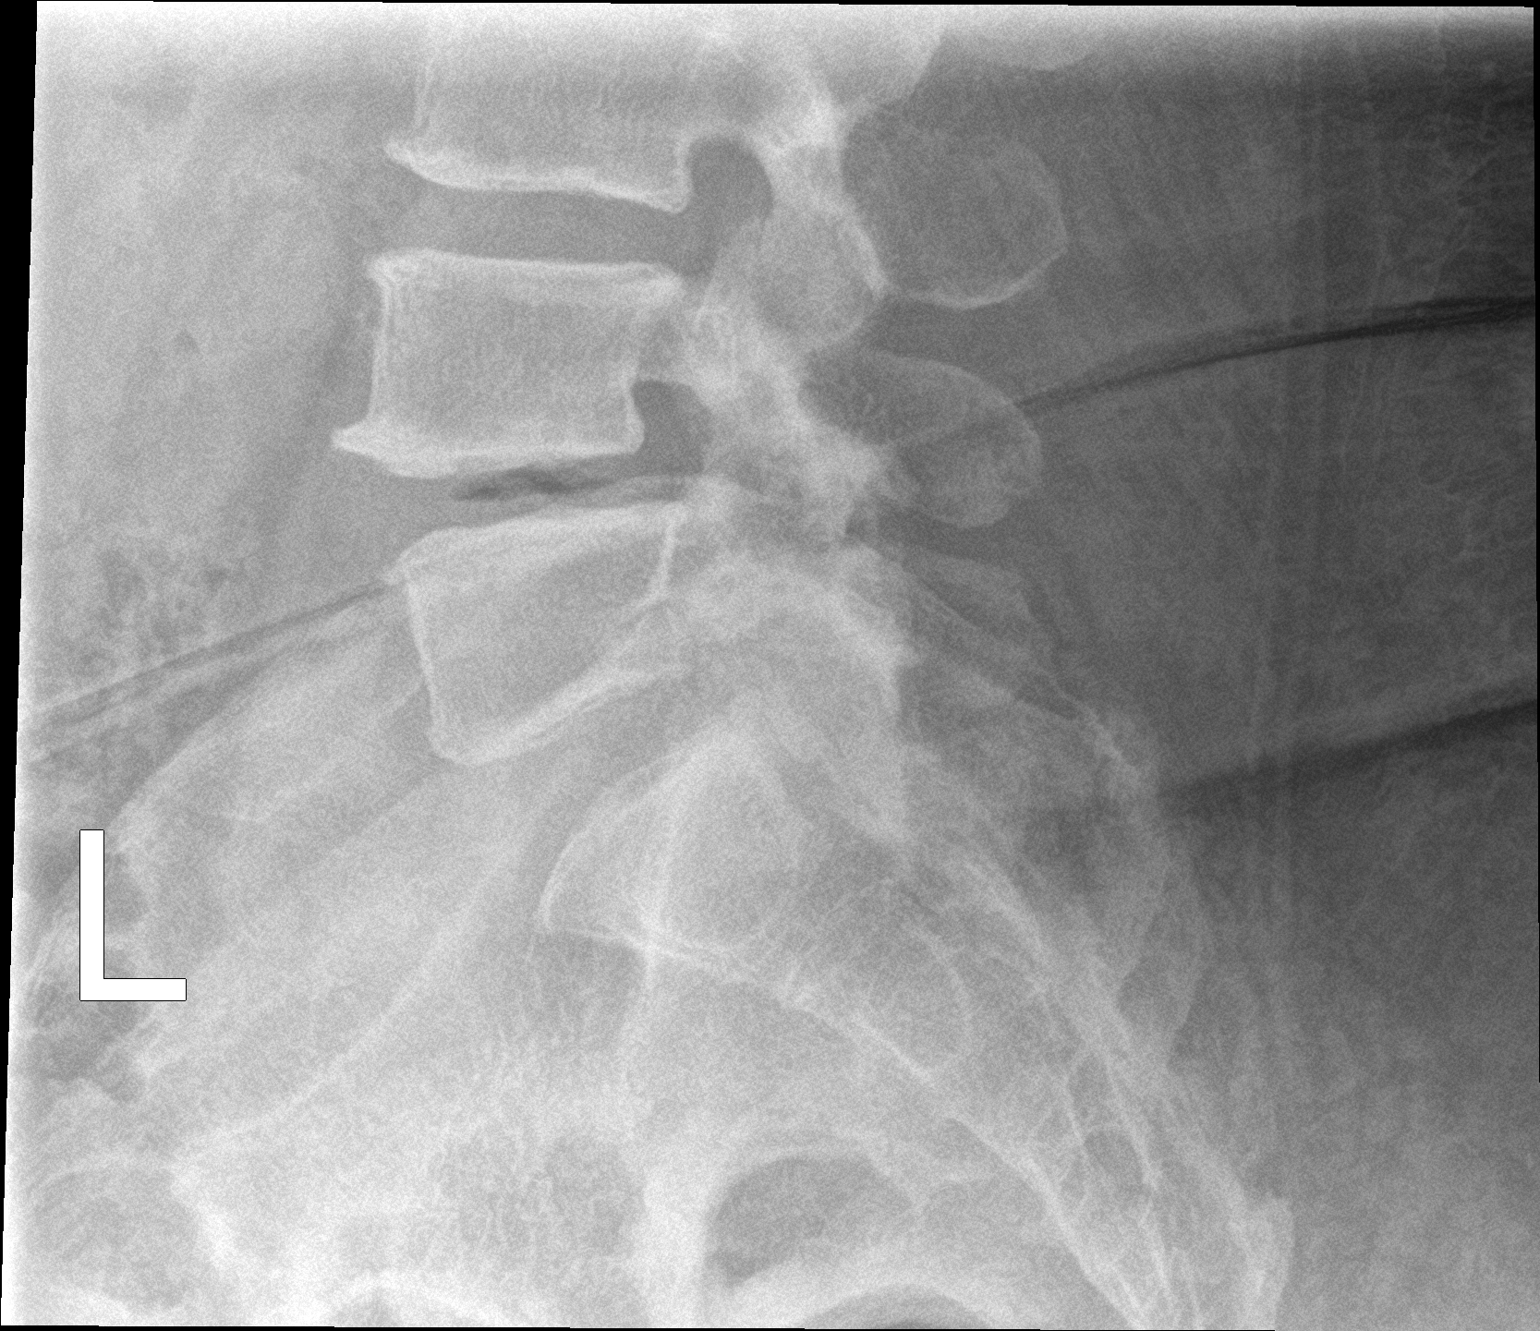

[3 of 3 positions shown; findings below may reference images not displayed]

FINDINGS: Vertebral body height is well maintained. Very minimal degenerative
anterolisthesis of L4 on L5 is noted. Vacuum disc phenomenon is
noted at L2-3 and L4-5. Mild osteophytic changes are seen. No
compression deformities are noted.
IMPRESSION: Mild degenerative change without acute abnormality.

## 2017-11-07 MED FILL — AMLODIPINE BESYLATE 10 MG T: 10 | 90 days supply | Qty: 90 | Fill #1

## 2017-11-07 MED FILL — HYDROCHLOROTHIAZIDE 25 MG T: 25 | 90 days supply | Qty: 90 | Fill #1

## 2017-11-07 MED FILL — DICLOFENAC SODIUM 1% GEL: 1 | 41 days supply | Qty: 500 | Fill #1

## 2017-11-14 MED FILL — LISINOPRIL 40 MG TABLET: 40 | 45 days supply | Qty: 90 | Fill #0

## 2017-11-20 MED FILL — POTASSIUM CHLORIDE CRYS ER: 20 | 15 days supply | Qty: 30 | Fill #0

## 2017-12-13 MED FILL — cloNIDine HCL 0.2 MG TABS: 0.2 | 90 days supply | Qty: 180 | Fill #0

## 2017-12-13 MED FILL — PANTOPRAZOLE SOD DR 40 MG T: 40 | 90 days supply | Qty: 90 | Fill #0

## 2017-12-13 MED FILL — POTASSIUM CHLORIDE CRYS ER: 20 | 90 days supply | Qty: 180 | Fill #0

## 2017-12-14 MED FILL — traMADol HCL 50 MG TABS: 50 | 30 days supply | Qty: 90 | Fill #0

## 2017-12-22 DIAGNOSIS — R5383 Other fatigue: Secondary | ICD-10-CM | POA: Diagnosis not present

## 2017-12-22 DIAGNOSIS — R7303 Prediabetes: Secondary | ICD-10-CM | POA: Diagnosis not present

## 2017-12-22 DIAGNOSIS — M47816 Spondylosis without myelopathy or radiculopathy, lumbar region: Secondary | ICD-10-CM | POA: Diagnosis not present

## 2017-12-22 DIAGNOSIS — I10 Essential (primary) hypertension: Secondary | ICD-10-CM | POA: Diagnosis not present

## 2017-12-22 MED FILL — predniSONE 10 MG (21) TBPK: 10 | 6 days supply | Qty: 21 | Fill #0

## 2017-12-25 MED FILL — TRIAMCINOLONE 0.1% CREAM: 0.1 | 20 days supply | Qty: 80 | Fill #0

## 2018-01-01 MED FILL — LISINOPRIL 40 MG TABLET: 40 | 45 days supply | Qty: 90 | Fill #1

## 2018-01-16 MED FILL — PRAVASTATIN SODIUM 20 MG TA: 20 | 30 days supply | Qty: 30 | Fill #0

## 2018-01-26 MED FILL — traMADol HCL 50 MG TABS: 50 | 30 days supply | Qty: 90 | Fill #0

## 2018-01-30 MED FILL — FLUTICASONE PROP 50 MCG SPR: 50 | 30 days supply | Qty: 16 | Fill #0

## 2018-02-05 MED FILL — HYDROCHLOROTHIAZIDE 25 MG T: 25 | 90 days supply | Qty: 90 | Fill #0

## 2018-02-05 MED FILL — AMLODIPINE BESYLATE 10 MG T: 10 | 90 days supply | Qty: 90 | Fill #0

## 2018-02-13 MED FILL — DICLOFENAC SODIUM 1 % GEL: 1 | 41 days supply | Qty: 500 | Fill #0

## 2018-02-15 MED FILL — LISINOPRIL 40 MG TABLET: 40 | 90 days supply | Qty: 180 | Fill #0

## 2018-02-15 MED FILL — HYDROCHLOROTHIAZIDE 25 MG T: 25 | 90 days supply | Qty: 90 | Fill #0

## 2018-02-15 MED FILL — PRAVASTATIN SODIUM 20 MG TA: 20 | 30 days supply | Qty: 30 | Fill #1

## 2018-02-15 MED FILL — AMLODIPINE BESYLATE 10 MG T: 10 | 90 days supply | Qty: 90 | Fill #0

## 2018-03-09 MED FILL — traMADol HCL 50 MG TABS: 50 | 30 days supply | Qty: 90 | Fill #0

## 2018-03-14 DIAGNOSIS — M5116 Intervertebral disc disorders with radiculopathy, lumbar region: Secondary | ICD-10-CM | POA: Diagnosis not present

## 2018-03-14 DIAGNOSIS — M9903 Segmental and somatic dysfunction of lumbar region: Secondary | ICD-10-CM | POA: Diagnosis not present

## 2018-03-14 DIAGNOSIS — M546 Pain in thoracic spine: Secondary | ICD-10-CM | POA: Diagnosis not present

## 2018-03-14 DIAGNOSIS — M5136 Other intervertebral disc degeneration, lumbar region: Secondary | ICD-10-CM | POA: Diagnosis not present

## 2018-03-14 DIAGNOSIS — M9902 Segmental and somatic dysfunction of thoracic region: Secondary | ICD-10-CM | POA: Diagnosis not present

## 2018-03-18 MED FILL — PRAVASTATIN SODIUM 20 MG TA: 20 | 30 days supply | Qty: 30 | Fill #2

## 2018-03-19 MED FILL — cloNIDine HCL 0.2 MG TABS: 0.2 | 90 days supply | Qty: 180 | Fill #0

## 2018-03-27 MED FILL — FLUTICASONE PROP 50 MCG SPR: 50 | 30 days supply | Qty: 16 | Fill #1

## 2018-03-27 MED FILL — PANTOPRAZOLE SOD DR 40 MG T: 40 | 90 days supply | Qty: 90 | Fill #0

## 2018-03-27 MED FILL — TRIAMCINOLONE 0.1% CREAM: 0.1 | 20 days supply | Qty: 80 | Fill #0

## 2018-04-09 MED FILL — POTASSIUM CHLORIDE CRYS ER: 20 | 90 days supply | Qty: 180 | Fill #0

## 2018-04-16 MED FILL — PRAVASTATIN SODIUM 20 MG TA: 20 | 90 days supply | Qty: 90 | Fill #3

## 2018-04-16 MED FILL — traMADol HCL 50 MG TABS: 50 | 30 days supply | Qty: 90 | Fill #0

## 2018-04-24 MED FILL — TRIAMCINOLONE 0.1% CREAM: 0.1 | 30 days supply | Qty: 80 | Fill #0

## 2018-05-01 MED FILL — FLUTICASONE PROP 50 MCG SPR: 50 | 60 days supply | Qty: 32 | Fill #2

## 2018-05-14 MED FILL — LISINOPRIL 40 MG TABLET: 40 | 45 days supply | Qty: 90 | Fill #0

## 2018-05-14 MED FILL — AMLODIPINE BESYLATE 10 MG T: 10 | 90 days supply | Qty: 90 | Fill #0

## 2018-05-14 MED FILL — HYDROCHLOROTHIAZIDE 25 MG T: 25 | 90 days supply | Qty: 90 | Fill #0

## 2018-06-06 MED FILL — traMADol HCL 50 MG TABS: 50 | 30 days supply | Qty: 90 | Fill #0

## 2018-06-14 MED FILL — DICLOFENAC SODIUM 1 % GEL: 1 | 41 days supply | Qty: 500 | Fill #1

## 2018-06-18 MED FILL — cloNIDine HCL 0.2 MG TABS: 0.2 | 90 days supply | Qty: 180 | Fill #0

## 2018-06-27 MED FILL — LISINOPRIL 40 MG TABLET: 40 | 90 days supply | Qty: 90 | Fill #0

## 2018-06-27 MED FILL — PANTOPRAZOLE SOD DR 40 MG T: 40 | 90 days supply | Qty: 90 | Fill #0

## 2018-07-03 MED FILL — TRIAMCINOLONE 0.1% CREAM: 0.1 | 30 days supply | Qty: 80 | Fill #0

## 2018-07-11 DIAGNOSIS — B354 Tinea corporis: Secondary | ICD-10-CM | POA: Diagnosis not present

## 2018-07-11 MED FILL — FLUCONAZOLE 150 MG TABS: 150 | 1 days supply | Qty: 1 | Fill #0

## 2018-07-11 MED FILL — NYSTATIN 100,000 UNIT/GM PO: 100000 | 30 days supply | Qty: 120 | Fill #0

## 2018-07-12 MED FILL — POTASSIUM CHLORIDE CRYS ER: 20 | 90 days supply | Qty: 180 | Fill #0

## 2018-07-12 MED FILL — PRAVASTATIN SODIUM 20 MG TA: 20 | 90 days supply | Qty: 90 | Fill #4

## 2018-07-30 MED FILL — traMADol HCL 50 MG TABS: 50 | 30 days supply | Qty: 90 | Fill #0

## 2018-07-30 MED FILL — SULFAMETHOXAZOLE-TMP DS TAB: 800-160 | 7 days supply | Qty: 14 | Fill #0

## 2018-08-13 MED FILL — AMLODIPINE BESYLATE 10 MG T: 10 | 90 days supply | Qty: 90 | Fill #1

## 2018-08-13 MED FILL — HYDROCHLOROTHIAZIDE 25 MG T: 25 | 90 days supply | Qty: 90 | Fill #0

## 2018-08-15 MED FILL — LISINOPRIL 40 MG TABLET: 40 | 45 days supply | Qty: 90 | Fill #0

## 2018-08-16 MED FILL — DOXYCYCLINE HYCLATE 100 MG: 100 | 10 days supply | Qty: 20 | Fill #0

## 2018-08-16 MED FILL — FLUCONAZOLE 150 MG TABS: 150 | 1 days supply | Qty: 1 | Fill #0

## 2018-09-03 MED FILL — traMADol HCL 50 MG TABS: 50 | 30 days supply | Qty: 90 | Fill #0

## 2018-09-18 MED FILL — NYSTATIN 100,000 UNIT/GM PO: 100000 | 30 days supply | Qty: 120 | Fill #1

## 2018-09-18 MED FILL — cloNIDine HCL 0.2 MG TABS: 0.2 | 90 days supply | Qty: 180 | Fill #0

## 2018-09-25 MED FILL — PANTOPRAZOLE SOD DR 40 MG T: 40 | 90 days supply | Qty: 90 | Fill #0

## 2018-09-25 MED FILL — LISINOPRIL 40 MG TABLET: 40 | 45 days supply | Qty: 90 | Fill #0

## 2018-10-15 MED FILL — PRAVASTATIN SODIUM 20 MG TA: 20 | 90 days supply | Qty: 90 | Fill #5

## 2018-10-16 MED FILL — POTASSIUM CHLORIDE CRYS ER: 20 | 90 days supply | Qty: 180 | Fill #0

## 2018-10-16 MED FILL — traMADol HCL 50 MG TABS: 50 | 30 days supply | Qty: 90 | Fill #0

## 2018-10-30 MED FILL — DICLOFENAC SODIUM 1 % GEL: 1 | 41 days supply | Qty: 500 | Fill #2

## 2018-11-12 MED FILL — HYDROCHLOROTHIAZIDE 25 MG T: 25 | 90 days supply | Qty: 90 | Fill #0

## 2018-11-12 MED FILL — AMLODIPINE BESYLATE 10 MG T: 10 | 90 days supply | Qty: 90 | Fill #0

## 2018-11-12 MED FILL — LISINOPRIL 40 MG TABLET: 40 | 45 days supply | Qty: 90 | Fill #0

## 2018-12-03 MED FILL — traMADol HCL 50 MG TABS: 50 | 30 days supply | Qty: 90 | Fill #0

## 2018-12-12 DIAGNOSIS — Z5320 Procedure and treatment not carried out because of patient's decision for unspecified reasons: Secondary | ICD-10-CM | POA: Diagnosis not present

## 2018-12-12 DIAGNOSIS — Z1211 Encounter for screening for malignant neoplasm of colon: Secondary | ICD-10-CM | POA: Diagnosis not present

## 2018-12-12 DIAGNOSIS — E559 Vitamin D deficiency, unspecified: Secondary | ICD-10-CM | POA: Diagnosis not present

## 2018-12-12 DIAGNOSIS — Z0000 Encounter for general adult medical examination without abnormal findings: Secondary | ICD-10-CM | POA: Diagnosis not present

## 2018-12-12 DIAGNOSIS — R7303 Prediabetes: Secondary | ICD-10-CM | POA: Diagnosis not present

## 2018-12-12 DIAGNOSIS — M47816 Spondylosis without myelopathy or radiculopathy, lumbar region: Secondary | ICD-10-CM | POA: Diagnosis not present

## 2018-12-12 DIAGNOSIS — Z23 Encounter for immunization: Secondary | ICD-10-CM | POA: Diagnosis not present

## 2018-12-12 DIAGNOSIS — Z6841 Body Mass Index (BMI) 40.0 and over, adult: Secondary | ICD-10-CM | POA: Diagnosis not present

## 2018-12-12 MED FILL — predniSONE 10 MG (21) TBPK: 10 | 6 days supply | Qty: 21 | Fill #0

## 2018-12-17 MED FILL — cloNIDine HCL 0.2 MG TABS: 0.2 | 90 days supply | Qty: 180 | Fill #0

## 2018-12-27 MED FILL — FLUTICASONE PROP 50 MCG SPR: 50 | 90 days supply | Qty: 48 | Fill #0

## 2018-12-27 MED FILL — LISINOPRIL 40 MG TABLET: 40 | 45 days supply | Qty: 90 | Fill #0

## 2018-12-27 MED FILL — PANTOPRAZOLE SOD DR 40 MG T: 40 | 90 days supply | Qty: 90 | Fill #0

## 2018-12-31 MED FILL — traMADol HCL 50 MG TABS: 50 | 30 days supply | Qty: 90 | Fill #0

## 2019-01-14 MED FILL — NYSTATIN 100,000 UNIT/GM PO: 100000 | 30 days supply | Qty: 120 | Fill #0

## 2019-01-14 MED FILL — POTASSIUM CHLORIDE CRYS ER: 20 | 90 days supply | Qty: 180 | Fill #0

## 2019-01-14 MED FILL — PRAVASTATIN SODIUM 20 MG TA: 20 | 90 days supply | Qty: 90 | Fill #0

## 2019-02-06 MED FILL — AMLODIPINE BESYLATE 10 MG T: 10 | 90 days supply | Qty: 90 | Fill #1

## 2019-02-07 MED FILL — HYDROCHLOROTHIAZIDE 25 MG T: 25 | 90 days supply | Qty: 90 | Fill #0

## 2019-02-07 MED FILL — traMADol HCL 50 MG TABS: 50 | 30 days supply | Qty: 90 | Fill #0

## 2019-02-11 MED FILL — LISINOPRIL 40 MG TABLET: 40 | 45 days supply | Qty: 90 | Fill #0

## 2019-03-11 MED FILL — traMADol HCL 50 MG TABS: 50 | 30 days supply | Qty: 90 | Fill #0

## 2019-03-19 MED FILL — cloNIDine HCL 0.2 MG TABS: 0.2 | 90 days supply | Qty: 180 | Fill #1

## 2019-03-29 MED FILL — LISINOPRIL 40 MG TABLET: 40 | 45 days supply | Qty: 90 | Fill #0

## 2019-03-29 MED FILL — PANTOPRAZOLE SOD DR 40 MG T: 40 | 90 days supply | Qty: 90 | Fill #0

## 2019-04-15 MED FILL — POTASSIUM CHLORIDE CRYS ER: 20 | 90 days supply | Qty: 180 | Fill #0

## 2019-04-15 MED FILL — traMADol HCL 50 MG TABS: 50 | 30 days supply | Qty: 90 | Fill #0

## 2019-04-15 MED FILL — PRAVASTATIN SODIUM 20 MG TA: 20 | 90 days supply | Qty: 90 | Fill #1

## 2019-05-07 MED FILL — HYDROCHLOROTHIAZIDE 25 MG T: 25 | 90 days supply | Qty: 90 | Fill #0

## 2019-05-07 MED FILL — FLUTICASONE PROP 50 MCG SPR: 50 | 30 days supply | Qty: 16 | Fill #1

## 2019-05-07 MED FILL — AMLODIPINE BESYLATE 10 MG T: 10 | 90 days supply | Qty: 90 | Fill #0

## 2019-05-14 MED FILL — LISINOPRIL 40 MG TABLET: 40 | 45 days supply | Qty: 90 | Fill #0

## 2019-06-18 ENCOUNTER — Other Ambulatory Visit (HOSPITAL_COMMUNITY): Payer: Self-pay | Admitting: Nurse Practitioner

## 2019-06-19 DIAGNOSIS — I10 Essential (primary) hypertension: Secondary | ICD-10-CM | POA: Diagnosis not present

## 2019-06-19 DIAGNOSIS — R7303 Prediabetes: Secondary | ICD-10-CM | POA: Diagnosis not present

## 2019-06-19 DIAGNOSIS — J3489 Other specified disorders of nose and nasal sinuses: Secondary | ICD-10-CM | POA: Diagnosis not present

## 2019-06-19 DIAGNOSIS — R040 Epistaxis: Secondary | ICD-10-CM | POA: Diagnosis not present

## 2019-06-19 DIAGNOSIS — R600 Localized edema: Secondary | ICD-10-CM | POA: Diagnosis not present

## 2019-06-26 MED FILL — LISINOPRIL 40 MG TABLET: 40 | 45 days supply | Qty: 90 | Fill #0

## 2019-06-28 MED FILL — traMADol HCL 50 MG TABS: 50 | 30 days supply | Qty: 90 | Fill #0

## 2019-07-03 ENCOUNTER — Other Ambulatory Visit (HOSPITAL_COMMUNITY): Payer: Self-pay | Admitting: Nurse Practitioner

## 2019-07-03 MED FILL — DICLOFENAC SODIUM 1 % GEL: 1 | 41 days supply | Qty: 500 | Fill #0

## 2019-07-03 MED FILL — PANTOPRAZOLE SOD DR 40 MG T: 40 | 90 days supply | Qty: 90 | Fill #0

## 2019-07-16 MED FILL — POTASSIUM CHLORIDE CRYS ER: 20 | 90 days supply | Qty: 180 | Fill #0

## 2019-07-16 MED FILL — PRAVASTATIN SODIUM 20 MG TA: 20 | 90 days supply | Qty: 90 | Fill #2

## 2019-07-23 DIAGNOSIS — Z1211 Encounter for screening for malignant neoplasm of colon: Secondary | ICD-10-CM | POA: Diagnosis not present

## 2019-08-06 MED FILL — HYDROCHLOROTHIAZIDE 25 MG T: 25 | 90 days supply | Qty: 90 | Fill #0

## 2019-08-06 MED FILL — traMADol HCL 50 MG TABS: 50 | 30 days supply | Qty: 90 | Fill #0

## 2019-08-08 MED FILL — AMLODIPINE BESYLATE 10 MG T: 10 | 90 days supply | Qty: 90 | Fill #1

## 2019-08-08 MED FILL — FLUTICASONE PROP 50 MCG SPR: 50 | 90 days supply | Qty: 48 | Fill #0

## 2019-08-09 MED FILL — LISINOPRIL 40 MG TABS: 40 | 90 days supply | Qty: 90 | Fill #0

## 2019-08-27 DIAGNOSIS — Z7189 Other specified counseling: Secondary | ICD-10-CM | POA: Diagnosis not present

## 2019-08-27 DIAGNOSIS — Z2821 Immunization not carried out because of patient refusal: Secondary | ICD-10-CM | POA: Diagnosis not present

## 2019-09-11 MED FILL — cloNIDine HCL 0.2 MG TABS: 0.2 | 90 days supply | Qty: 180 | Fill #1

## 2019-09-11 MED FILL — traMADol HCL 50 MG TABS: 50 | 30 days supply | Qty: 90 | Fill #0

## 2019-10-02 MED FILL — LISINOPRIL 40 MG TABS: 40 | 45 days supply | Qty: 90 | Fill #0

## 2019-10-03 ENCOUNTER — Other Ambulatory Visit (HOSPITAL_COMMUNITY): Payer: Self-pay | Admitting: Nurse Practitioner

## 2019-10-07 MED FILL — PANTOPRAZOLE SOD DR 40 MG T: 40 | 90 days supply | Qty: 90 | Fill #0

## 2019-10-07 MED FILL — PRAVASTATIN SODIUM 20 MG TA: 20 | 90 days supply | Qty: 90 | Fill #3

## 2019-10-15 ENCOUNTER — Other Ambulatory Visit (HOSPITAL_COMMUNITY): Payer: Self-pay | Admitting: Nurse Practitioner

## 2019-10-15 MED FILL — POTASSIUM CHLORIDE CRYS ER: 20 | 90 days supply | Qty: 180 | Fill #0

## 2019-10-15 MED FILL — traMADol HCL 50 MG TABS: 50 | 30 days supply | Qty: 90 | Fill #0

## 2019-11-04 ENCOUNTER — Other Ambulatory Visit (HOSPITAL_COMMUNITY): Payer: Self-pay | Admitting: Nurse Practitioner

## 2019-11-04 MED FILL — HYDROCHLOROTHIAZIDE 25 MG T: 25 | 90 days supply | Qty: 90 | Fill #0

## 2019-11-04 MED FILL — AMLODIPINE BESYLATE 10 MG T: 10 | 90 days supply | Qty: 90 | Fill #0

## 2019-11-18 ENCOUNTER — Other Ambulatory Visit (HOSPITAL_COMMUNITY): Payer: Self-pay | Admitting: Nurse Practitioner

## 2019-11-18 MED FILL — traMADol HCL 50 MG TABS: 50 | 30 days supply | Qty: 90 | Fill #0

## 2019-11-20 ENCOUNTER — Other Ambulatory Visit (HOSPITAL_COMMUNITY): Payer: Self-pay | Admitting: Nurse Practitioner

## 2019-11-20 MED FILL — LISINOPRIL 40 MG TABS: 40 | 90 days supply | Qty: 180 | Fill #0

## 2019-12-17 MED FILL — NYSTATIN 100,000 UNIT/GM PO: 100000 | 30 days supply | Qty: 120 | Fill #1

## 2019-12-17 MED FILL — cloNIDine HCL 0.2 MG TABS: 0.2 | 90 days supply | Qty: 180 | Fill #0

## 2019-12-25 ENCOUNTER — Other Ambulatory Visit (HOSPITAL_COMMUNITY): Payer: Self-pay | Admitting: Nurse Practitioner

## 2019-12-25 MED FILL — traMADol HCL 50 MG TABS: 50 | 30 days supply | Qty: 90 | Fill #0

## 2020-01-09 ENCOUNTER — Other Ambulatory Visit (HOSPITAL_COMMUNITY): Payer: Self-pay | Admitting: Nurse Practitioner

## 2020-01-09 MED FILL — PANTOPRAZOLE SOD DR 40 MG T: 40 | 90 days supply | Qty: 90 | Fill #0

## 2020-01-16 ENCOUNTER — Other Ambulatory Visit (HOSPITAL_COMMUNITY): Payer: Self-pay | Admitting: Nurse Practitioner

## 2020-01-16 MED FILL — PRAVASTATIN SODIUM 20 MG TA: 20 | 90 days supply | Qty: 90 | Fill #0

## 2020-02-04 ENCOUNTER — Other Ambulatory Visit (HOSPITAL_COMMUNITY): Payer: Self-pay | Admitting: Nurse Practitioner

## 2020-02-04 MED FILL — HYDROCHLOROTHIAZIDE 25 MG T: 25 | 90 days supply | Qty: 90 | Fill #0

## 2020-02-04 MED FILL — AMLODIPINE BESYLATE 10 MG T: 10 | 90 days supply | Qty: 90 | Fill #0

## 2020-02-19 ENCOUNTER — Other Ambulatory Visit (HOSPITAL_COMMUNITY): Payer: Self-pay | Admitting: Nurse Practitioner

## 2020-02-19 MED FILL — traMADol HCL 50 MG TABS: 50 | 30 days supply | Qty: 90 | Fill #0

## 2020-02-19 MED FILL — LISINOPRIL 40 MG TABS: 40 | 90 days supply | Qty: 180 | Fill #0

## 2020-02-20 ENCOUNTER — Other Ambulatory Visit (HOSPITAL_COMMUNITY): Payer: Self-pay | Admitting: Nurse Practitioner

## 2020-02-20 MED FILL — BENZONATATE 100 MG CAPS: 100 | 6 days supply | Qty: 20 | Fill #0

## 2020-02-20 MED FILL — predniSONE 10 MG TABS: 10 | 9 days supply | Qty: 18 | Fill #0

## 2020-03-09 ENCOUNTER — Other Ambulatory Visit (HOSPITAL_COMMUNITY): Payer: Self-pay | Admitting: Nurse Practitioner

## 2020-03-09 DIAGNOSIS — L709 Acne, unspecified: Secondary | ICD-10-CM | POA: Diagnosis not present

## 2020-03-09 DIAGNOSIS — F419 Anxiety disorder, unspecified: Secondary | ICD-10-CM | POA: Diagnosis not present

## 2020-03-09 DIAGNOSIS — I10 Essential (primary) hypertension: Secondary | ICD-10-CM | POA: Diagnosis not present

## 2020-03-09 DIAGNOSIS — R7303 Prediabetes: Secondary | ICD-10-CM | POA: Diagnosis not present

## 2020-03-09 MED FILL — CLINDAMYCIN PHOS-BENZOYL PE: 1-5 | 30 days supply | Qty: 50 | Fill #0

## 2020-03-09 MED FILL — HYDROXYZINE HCL 25 MG TABS: 25 | 30 days supply | Qty: 30 | Fill #0

## 2020-03-16 MED FILL — cloNIDine HCL 0.2 MG TABS: 0.2 | 90 days supply | Qty: 180 | Fill #1

## 2020-03-16 MED FILL — DICLOFENAC SODIUM 1 % GEL: 1 | 41 days supply | Qty: 500 | Fill #1

## 2020-03-31 ENCOUNTER — Other Ambulatory Visit (HOSPITAL_BASED_OUTPATIENT_CLINIC_OR_DEPARTMENT_OTHER): Payer: Self-pay

## 2020-04-06 ENCOUNTER — Other Ambulatory Visit (HOSPITAL_COMMUNITY): Payer: Self-pay | Admitting: Nurse Practitioner

## 2020-04-06 MED FILL — traMADol HCL 50 MG TABS: 50 | 30 days supply | Qty: 90 | Fill #0

## 2020-04-09 ENCOUNTER — Other Ambulatory Visit (HOSPITAL_COMMUNITY): Payer: Self-pay | Admitting: Nurse Practitioner

## 2020-04-09 MED FILL — PRAVASTATIN SODIUM 20 MG TA: 20 | 90 days supply | Qty: 90 | Fill #1

## 2020-04-09 MED FILL — PANTOPRAZOLE SOD DR 40 MG T: 40 | 90 days supply | Qty: 90 | Fill #0

## 2020-05-11 ENCOUNTER — Other Ambulatory Visit (HOSPITAL_COMMUNITY): Payer: Self-pay

## 2020-05-11 MED ORDER — LISINOPRIL 40 MG PO TABS
40.0000 mg | ORAL_TABLET | Freq: Two times a day (BID) | ORAL | 1 refills | Status: DC
  Filled 2020-05-11: qty 180, 90d supply, fill #0

## 2020-05-11 MED ORDER — HYDROCHLOROTHIAZIDE 25 MG PO TABS
1.0000 | ORAL_TABLET | Freq: Every day | ORAL | 1 refills | Status: DC
  Filled 2020-05-11: qty 90, 90d supply, fill #0
  Filled 2020-08-13: qty 90, 90d supply, fill #1

## 2020-05-11 MED ORDER — HYDROXYZINE HCL 25 MG PO TABS
ORAL_TABLET | ORAL | 0 refills | Status: DC
  Filled 2020-05-11: qty 30, 30d supply, fill #0

## 2020-05-11 MED ORDER — POTASSIUM CHLORIDE CRYS ER 20 MEQ PO TBCR
40.0000 meq | EXTENDED_RELEASE_TABLET | Freq: Every day | ORAL | 1 refills | Status: DC
  Filled 2020-05-11: qty 180, 90d supply, fill #0
  Filled 2020-09-21: qty 180, 90d supply, fill #1

## 2020-05-11 MED ORDER — AMLODIPINE BESYLATE 10 MG PO TABS
1.0000 | ORAL_TABLET | Freq: Every day | ORAL | 1 refills | Status: DC
  Filled 2020-05-11: qty 90, 90d supply, fill #0
  Filled 2020-08-04: qty 90, 90d supply, fill #1

## 2020-05-12 ENCOUNTER — Other Ambulatory Visit (HOSPITAL_COMMUNITY): Payer: Self-pay

## 2020-05-13 ENCOUNTER — Other Ambulatory Visit (HOSPITAL_COMMUNITY): Payer: Self-pay

## 2020-05-15 ENCOUNTER — Other Ambulatory Visit (HOSPITAL_COMMUNITY): Payer: Self-pay

## 2020-05-18 ENCOUNTER — Other Ambulatory Visit (HOSPITAL_COMMUNITY): Payer: Self-pay

## 2020-05-20 ENCOUNTER — Other Ambulatory Visit (HOSPITAL_COMMUNITY): Payer: Self-pay

## 2020-05-20 ENCOUNTER — Observation Stay (HOSPITAL_BASED_OUTPATIENT_CLINIC_OR_DEPARTMENT_OTHER): Payer: 59

## 2020-05-20 ENCOUNTER — Observation Stay (HOSPITAL_BASED_OUTPATIENT_CLINIC_OR_DEPARTMENT_OTHER)
Admission: EM | Admit: 2020-05-20 | Discharge: 2020-05-21 | Disposition: A | Discharge status: Home / Self Care | Payer: 59 | Attending: Internal Medicine | Admitting: Internal Medicine

## 2020-05-20 ENCOUNTER — Emergency Department (HOSPITAL_BASED_OUTPATIENT_CLINIC_OR_DEPARTMENT_OTHER): Payer: 59

## 2020-05-20 ENCOUNTER — Other Ambulatory Visit: Payer: Self-pay

## 2020-05-20 ENCOUNTER — Encounter (HOSPITAL_BASED_OUTPATIENT_CLINIC_OR_DEPARTMENT_OTHER): Payer: Self-pay | Admitting: Emergency Medicine

## 2020-05-20 DIAGNOSIS — I10 Essential (primary) hypertension: Secondary | ICD-10-CM | POA: Insufficient documentation

## 2020-05-20 DIAGNOSIS — Z79899 Other long term (current) drug therapy: Secondary | ICD-10-CM | POA: Diagnosis not present

## 2020-05-20 DIAGNOSIS — Z20822 Contact with and (suspected) exposure to covid-19: Secondary | ICD-10-CM | POA: Insufficient documentation

## 2020-05-20 DIAGNOSIS — R55 Syncope and collapse: Principal | ICD-10-CM | POA: Diagnosis present

## 2020-05-20 DIAGNOSIS — R42 Dizziness and giddiness: Secondary | ICD-10-CM | POA: Diagnosis not present

## 2020-05-20 DIAGNOSIS — I517 Cardiomegaly: Secondary | ICD-10-CM | POA: Diagnosis not present

## 2020-05-20 DIAGNOSIS — I160 Hypertensive urgency: Secondary | ICD-10-CM

## 2020-05-20 DIAGNOSIS — S199XXA Unspecified injury of neck, initial encounter: Secondary | ICD-10-CM | POA: Diagnosis not present

## 2020-05-20 LAB — CBC
HCT: 37.7 % (ref 36.0–46.0)
Hemoglobin: 12 g/dL (ref 12.0–15.0)
MCH: 25.5 pg — ABNORMAL LOW (ref 26.0–34.0)
MCHC: 31.8 g/dL (ref 30.0–36.0)
MCV: 80 fL (ref 80.0–100.0)
Platelets: 269 10*3/uL (ref 150–400)
RBC: 4.71 MIL/uL (ref 3.87–5.11)
RDW: 17.3 % — ABNORMAL HIGH (ref 11.5–15.5)
WBC: 7.2 10*3/uL (ref 4.0–10.5)
nRBC: 0.8 % — ABNORMAL HIGH (ref 0.0–0.2)

## 2020-05-20 LAB — RESP PANEL BY RT-PCR (FLU A&B, COVID) ARPGX2
Influenza A by PCR: NEGATIVE
Influenza B by PCR: NEGATIVE
SARS Coronavirus 2 by RT PCR: NEGATIVE

## 2020-05-20 LAB — MAGNESIUM: Magnesium: 1.6 mg/dL — ABNORMAL LOW (ref 1.7–2.4)

## 2020-05-20 LAB — CREATININE, SERUM
Creatinine, Ser: 0.6 mg/dL (ref 0.44–1.00)
GFR, Estimated: >60 mL/min (ref >60)

## 2020-05-20 LAB — COMPREHENSIVE METABOLIC PANEL
ALT: 24 U/L (ref 0–44)
AST: 22 U/L (ref 15–41)
Albumin: 3 g/dL — ABNORMAL LOW (ref 3.5–5.0)
Alkaline Phosphatase: 89 U/L (ref 38–126)
Anion gap: 9 (ref 5–15)
BUN: 10 mg/dL (ref 8–23)
CO2: 27 mmol/L (ref 22–32)
Calcium: 9.3 mg/dL (ref 8.9–10.3)
Chloride: 101 mmol/L (ref 98–111)
Creatinine, Ser: 0.68 mg/dL (ref 0.44–1.00)
GFR, Estimated: >60 mL/min (ref >60)
Glucose, Bld: 119 mg/dL — ABNORMAL HIGH (ref 70–99)
Potassium: 3.1 mmol/L — ABNORMAL LOW (ref 3.5–5.1)
Sodium: 137 mmol/L (ref 135–145)
Total Bilirubin: 0.4 mg/dL (ref 0.3–1.2)
Total Protein: 7.4 g/dL (ref 6.5–8.1)

## 2020-05-20 LAB — CBC WITH DIFFERENTIAL/PLATELET
Abs Immature Granulocytes: 0.02 10*3/uL (ref 0.00–0.07)
Basophils Absolute: 0.1 10*3/uL (ref 0.0–0.1)
Basophils Relative: 1 %
Eosinophils Absolute: 0.4 10*3/uL (ref 0.0–0.5)
Eosinophils Relative: 5 %
HCT: 37.2 % (ref 36.0–46.0)
Hemoglobin: 11.9 g/dL — ABNORMAL LOW (ref 12.0–15.0)
Immature Granulocytes: 0 %
Lymphocytes Relative: 41 %
Lymphs Abs: 3.1 10*3/uL (ref 0.7–4.0)
MCH: 25.3 pg — ABNORMAL LOW (ref 26.0–34.0)
MCHC: 32 g/dL (ref 30.0–36.0)
MCV: 79.1 fL — ABNORMAL LOW (ref 80.0–100.0)
Monocytes Absolute: 0.7 10*3/uL (ref 0.1–1.0)
Monocytes Relative: 10 %
Neutro Abs: 3.2 10*3/uL (ref 1.7–7.7)
Neutrophils Relative %: 43 %
Platelets: 278 10*3/uL (ref 150–400)
RBC: 4.7 MIL/uL (ref 3.87–5.11)
RDW: 17.4 % — ABNORMAL HIGH (ref 11.5–15.5)
WBC: 7.6 10*3/uL (ref 4.0–10.5)
nRBC: 0 % (ref 0.0–0.2)

## 2020-05-20 LAB — ECHOCARDIOGRAM COMPLETE
Area-P 1/2: 3.12 cm2
Height: 62 in
S' Lateral: 2.48 cm
Weight: 5616 oz

## 2020-05-20 LAB — TROPONIN I (HIGH SENSITIVITY)
Troponin I (High Sensitivity): 6 ng/L (ref <18)
Troponin I (High Sensitivity): 6 ng/L (ref <18)

## 2020-05-20 LAB — BRAIN NATRIURETIC PEPTIDE: B Natriuretic Peptide: 29.8 pg/mL (ref 0.0–100.0)

## 2020-05-20 MED ORDER — ALUM & MAG HYDROXIDE-SIMETH 200-200-20 MG/5ML PO SUSP: 30.0000 mL | Freq: Four times a day (QID) | ORAL | Status: DC | PRN

## 2020-05-20 MED ORDER — ACETAMINOPHEN 650 MG RE SUPP: 650.0000 mg | Freq: Four times a day (QID) | RECTAL | Status: DC | PRN

## 2020-05-20 MED ORDER — HYDROXYZINE HCL 25 MG PO TABS: 25.0000 mg | ORAL_TABLET | Freq: Every evening | ORAL | Status: DC | PRN

## 2020-05-20 MED ORDER — FUROSEMIDE 10 MG/ML IJ SOLN
40.0000 mg | Freq: Once | INTRAMUSCULAR | Status: AC
  Administered 2020-05-20: 40 mg via INTRAVENOUS
  Filled 2020-05-20: qty 4

## 2020-05-20 MED ORDER — AMLODIPINE BESYLATE 10 MG PO TABS
10.0000 mg | ORAL_TABLET | Freq: Every day | ORAL | Status: DC
  Administered 2020-05-21: 10 mg via ORAL
  Filled 2020-05-20: qty 1

## 2020-05-20 MED ORDER — SODIUM CHLORIDE 0.9% FLUSH
3.0000 mL | Freq: Two times a day (BID) | INTRAVENOUS | Status: DC
  Administered 2020-05-20 – 2020-05-21 (×3): 3 mL via INTRAVENOUS

## 2020-05-20 MED ORDER — POLYETHYLENE GLYCOL 3350 17 G PO PACK: 17.0000 g | PACK | Freq: Every day | ORAL | Status: DC | PRN

## 2020-05-20 MED ORDER — ONDANSETRON HCL 4 MG/2ML IJ SOLN: 4.0000 mg | Freq: Four times a day (QID) | INTRAMUSCULAR | Status: DC | PRN

## 2020-05-20 MED ORDER — HYDROCHLOROTHIAZIDE 25 MG PO TABS
25.0000 mg | ORAL_TABLET | Freq: Every day | ORAL | Status: DC
  Administered 2020-05-21: 25 mg via ORAL
  Filled 2020-05-20: qty 1

## 2020-05-20 MED ORDER — MOMETASONE FURO-FORMOTEROL FUM 200-5 MCG/ACT IN AERO
2.0000 | INHALATION_SPRAY | Freq: Two times a day (BID) | RESPIRATORY_TRACT | Status: DC
  Administered 2020-05-20 – 2020-05-21 (×2): 2 via RESPIRATORY_TRACT
  Filled 2020-05-20: qty 8.8

## 2020-05-20 MED ORDER — LISINOPRIL 10 MG PO TABS
40.0000 mg | ORAL_TABLET | Freq: Once | ORAL | Status: AC
  Administered 2020-05-20: 40 mg via ORAL
  Filled 2020-05-20: qty 4

## 2020-05-20 MED ORDER — TRAMADOL HCL 50 MG PO TABS
50.0000 mg | ORAL_TABLET | Freq: Three times a day (TID) | ORAL | Status: DC
  Administered 2020-05-20 – 2020-05-21 (×4): 50 mg via ORAL
  Filled 2020-05-20 (×4): qty 1

## 2020-05-20 MED ORDER — LISINOPRIL 20 MG PO TABS
40.0000 mg | ORAL_TABLET | Freq: Two times a day (BID) | ORAL | Status: DC
  Administered 2020-05-20 – 2020-05-21 (×2): 40 mg via ORAL
  Filled 2020-05-20 (×2): qty 2

## 2020-05-20 MED ORDER — CLONIDINE HCL 0.2 MG PO TABS
0.2000 mg | ORAL_TABLET | Freq: Two times a day (BID) | ORAL | Status: DC
  Administered 2020-05-20 – 2020-05-21 (×3): 0.2 mg via ORAL
  Filled 2020-05-20 (×3): qty 1

## 2020-05-20 MED ORDER — B COMPLEX-C PO TABS
1.0000 | ORAL_TABLET | Freq: Every day | ORAL | Status: DC
  Administered 2020-05-20 – 2020-05-21 (×2): 1 via ORAL
  Filled 2020-05-20 (×2): qty 1

## 2020-05-20 MED ORDER — CALCIUM CARBONATE 1250 (500 CA) MG PO TABS
1250.0000 mg | ORAL_TABLET | Freq: Every day | ORAL | Status: DC
  Administered 2020-05-20 – 2020-05-21 (×2): 1250 mg via ORAL
  Filled 2020-05-20 (×2): qty 1

## 2020-05-20 MED ORDER — AMLODIPINE BESYLATE 5 MG PO TABS
10.0000 mg | ORAL_TABLET | Freq: Once | ORAL | Status: AC
  Administered 2020-05-20: 10 mg via ORAL
  Filled 2020-05-20: qty 2

## 2020-05-20 MED ORDER — PERFLUTREN LIPID MICROSPHERE
1.0000 mL | INTRAVENOUS | Status: AC | PRN
  Administered 2020-05-20: 2 mL via INTRAVENOUS

## 2020-05-20 MED ORDER — ASCORBIC ACID 500 MG PO TABS
500.0000 mg | ORAL_TABLET | Freq: Every day | ORAL | Status: DC
  Administered 2020-05-20 – 2020-05-21 (×2): 500 mg via ORAL
  Filled 2020-05-20 (×2): qty 1

## 2020-05-20 MED ORDER — ALBUTEROL SULFATE HFA 108 (90 BASE) MCG/ACT IN AERS
2.0000 | INHALATION_SPRAY | RESPIRATORY_TRACT | Status: DC | PRN
  Filled 2020-05-20: qty 6.7

## 2020-05-20 MED ORDER — ONDANSETRON HCL 4 MG PO TABS
4.0000 mg | ORAL_TABLET | Freq: Four times a day (QID) | ORAL | Status: DC | PRN
Start: 2020-05-20 — End: 2020-05-21

## 2020-05-20 MED ORDER — MAGNESIUM OXIDE -MG SUPPLEMENT 400 (240 MG) MG PO TABS
800.0000 mg | ORAL_TABLET | Freq: Two times a day (BID) | ORAL | Status: DC
  Administered 2020-05-20 – 2020-05-21 (×2): 800 mg via ORAL
  Filled 2020-05-20 (×2): qty 2

## 2020-05-20 MED ORDER — POTASSIUM CHLORIDE CRYS ER 20 MEQ PO TBCR
40.0000 meq | EXTENDED_RELEASE_TABLET | Freq: Two times a day (BID) | ORAL | Status: AC
  Administered 2020-05-20 (×2): 40 meq via ORAL
  Filled 2020-05-20 (×2): qty 2

## 2020-05-20 MED ORDER — POLYVINYL ALCOHOL 1.4 % OP SOLN
1.0000 [drp] | Freq: Every day | OPHTHALMIC | Status: DC
  Administered 2020-05-20 – 2020-05-21 (×2): 1 [drp] via OPHTHALMIC
  Filled 2020-05-20: qty 15

## 2020-05-20 MED ORDER — ENOXAPARIN SODIUM 80 MG/0.8ML IJ SOSY
80.0000 mg | PREFILLED_SYRINGE | Freq: Every day | INTRAMUSCULAR | Status: DC
  Administered 2020-05-20 – 2020-05-21 (×2): 80 mg via SUBCUTANEOUS
  Filled 2020-05-20 (×2): qty 0.8

## 2020-05-20 MED ORDER — IBUPROFEN 800 MG PO TABS
800.0000 mg | ORAL_TABLET | Freq: Two times a day (BID) | ORAL | Status: DC | PRN
  Administered 2020-05-20: 800 mg via ORAL
  Filled 2020-05-20: qty 1

## 2020-05-20 MED ORDER — ACETAMINOPHEN 325 MG PO TABS
650.0000 mg | ORAL_TABLET | Freq: Four times a day (QID) | ORAL | Status: DC | PRN
Start: 2020-05-20 — End: 2020-05-21

## 2020-05-20 MED ORDER — DICLOFENAC SODIUM 1 % EX GEL
4.0000 g | Freq: Three times a day (TID) | CUTANEOUS | Status: DC | PRN
  Filled 2020-05-20: qty 100

## 2020-05-20 MED ORDER — PANTOPRAZOLE SODIUM 40 MG PO TBEC
40.0000 mg | DELAYED_RELEASE_TABLET | Freq: Every day | ORAL | Status: DC
  Administered 2020-05-20 – 2020-05-21 (×2): 40 mg via ORAL
  Filled 2020-05-20 (×2): qty 1

## 2020-05-20 MED ORDER — PRAVASTATIN SODIUM 20 MG PO TABS
20.0000 mg | ORAL_TABLET | Freq: Every evening | ORAL | Status: DC
  Administered 2020-05-20: 20 mg via ORAL
  Filled 2020-05-20: qty 1

## 2020-05-20 NOTE — ED Notes (Signed)
Called to give report to Elvina Sidle, RN getting her patient's settled will call back in 10 minutes.

## 2020-05-20 NOTE — ED Triage Notes (Signed)
Pt states has been out of htn meds for about a week. Has been feeling "funny", unable to function today. Dizzy, palpitations. Pt drove herself here today. Pt fell out of registration chair as this RN and RT came up to check on her.

## 2020-05-20 NOTE — H&P (Addendum)
History and Physical    Ashley Warren EVO:[id number] DOB: September 29, 1955 DOA: 05/20/2020  PCP: Berkley Harvey, NP  Patient coming from: Home  Chief Complaint: Dizziness  HPI: Ashley Warren is a 65 y.o. female with medical history significant of HTN, GERD. Presenting with 2 days of dizziness and headache. She dizziness was worse with walking. Her headache was intermittent and throbbing.  She tried resting, ibuprofen, and tramadol; however, these interventions did not work. She notes that last night her symptoms worsened to include shortness of breathe that was exacerbated by lying flat. She became concerned and decided to come to the ED. When she got to the ED, she felt as if she passed out, but is unsure. Of note, she reports not having her BP meds for the last week.   ED Course: Trp and BNP were ok. EKG was ok. CXR showed some vascular congestion. She was given lasix, amlodipine, lisinopril. CTH was negative. CT c-spine was negative. TRH was called for admission.   Review of Systems:  Denies CP, N/V/D, fevers, sick contacts. Reports dizziness, syncope, palpitations, dyspnea. Review of systems is otherwise negative for all not mentioned in HPI.   PMHx Past Medical History:  Diagnosis Date  . Hypertension     PSHx Past Surgical History:  Procedure Laterality Date  . ABDOMINAL HYSTERECTOMY    . APPENDECTOMY    . COSMETIC SURGERY     mass removed from shoulder/scapula area    SocHx  reports that she has never smoked. She has never used smokeless tobacco. She reports current alcohol use. She reports that she does not use drugs.  Allergies  Allergen Reactions  . Penicillins Itching    FamHx Family History  Problem Relation Age of Onset  . Alcohol abuse Maternal Grandmother   . Autism spectrum disorder Sister   . Breast cancer Mother   . Depression Sister   . Hypertension Mother   . Hypertension Sister   . Osteoarthritis Maternal Grandmother     Prior to Admission  medications   Medication Sig Start Date End Date Taking? Authorizing Provider  albuterol (PROVENTIL HFA;VENTOLIN HFA) 108 (90 BASE) MCG/ACT inhaler Inhale 2 puffs into the lungs every 4 (four) hours as needed for wheezing or shortness of breath. 09/30/13   Waldemar Dickens, MD  amLODipine (NORVASC) 10 MG tablet Take 10 mg by mouth daily.      [provider]  amLODipine (NORVASC) 10 MG tablet TAKE 1 TABLET BY MOUTH ONCE A DAY 02/04/20 02/03/21  Berkley Harvey, NP  amLODipine (NORVASC) 10 MG tablet TAKE 1 TABLET BY MOUTH ONCE DAILY 05/11/20     benzonatate (TESSALON) 100 MG capsule TAKE 1 CAPSULE BY MOUTH 3 TIMES DAILY AS NEEDED FOR COUGH FOR UP TO 7 DAYS 02/20/20 02/19/21  Berkley Harvey, NP  Steva Colder Extract POWD 375 mg by Does not apply route. Take 375 mg 2 times a week.    [provider]  budesonide-formoterol (SYMBICORT) 160-4.5 MCG/ACT inhaler Inhale 2 puffs into the lungs 2 (two) times daily.    [provider]  cetirizine (ZYRTEC) 10 MG tablet Take 10 mg by mouth daily.    [provider]  clindamycin-benzoyl peroxide (BENZACLIN) gel APPLY TOPICALLY TWICE A DAY 03/09/20 03/09/21  Berkley Harvey, NP  cloNIDine (CATAPRES) 0.1 MG tablet Take 0.1 mg by mouth 2 (two) times daily.      [provider]  cloNIDine (CATAPRES) 0.2 MG tablet TAKE 1 TABLET BY MOUTH 2  TIMES DAILY 06/18/19 06/17/20  Berkley Harvey, NP  diclofenac Sodium (VOLTAREN) 1 % GEL USE 4 GRAMS 3 TIMES DAILY TO AFFECTED AREA(S) 07/03/19 07/02/20  Berkley Harvey, NP  fish oil-omega-3 fatty acids 1000 MG capsule Take 2 g by mouth daily.      [provider]  fluticasone (FLONASE) 50 MCG/ACT nasal spray Place 2 sprays into both nostrils daily. 09/30/13   Waldemar Dickens, MD  hydrochlorothiazide (HYDRODIURIL) 25 MG tablet TAKE 1 TABLET BY MOUTH ONCE DAILY 11/04/19 11/03/20  Berkley Harvey, NP  hydrochlorothiazide (HYDRODIURIL) 25 MG tablet TAKE 1 TABLET BY MOUTH ONCE DAILY 05/11/20      hydrochlorothiazide 25 MG tablet Take 25 mg by mouth daily.      [provider]  hydrOXYzine (ATARAX/VISTARIL) 25 MG tablet TAKE 1 TABLET BY MOUTH DAILY AS NEEDED FOR ANXIETY/SLEEP 03/09/20 03/09/21  Berkley Harvey, NP  hydrOXYzine (ATARAX/VISTARIL) 25 MG tablet Take 1 tablet by mouth daily if needed for anxiety/sleep. 03/09/20     lisinopril (PRINIVIL,ZESTRIL) 40 MG tablet Take 40 mg by mouth 2 (two) times daily.      [provider]  lisinopril (ZESTRIL) 40 MG tablet TAKE 1 TABLET BY MOUTH TWO TIMES DAILY 10/03/19 10/02/20  Berkley Harvey, NP  lisinopril (ZESTRIL) 40 MG tablet TAKE 1 TABLET BY MOUTH TWICE DAILY 05/11/20     pantoprazole (PROTONIX) 40 MG tablet Take 40 mg by mouth daily.    [provider]  pantoprazole (PROTONIX) 40 MG tablet TAKE 1 TABLET BY MOUTH ONCE A DAY 04/09/20 04/09/21  Berkley Harvey, NP  pantoprazole (PROTONIX) 40 MG tablet TAKE 1 TABLET BY MOUTH ONCE A DAY 01/09/20 01/08/21  Berkley Harvey, NP  potassium chloride SA (K-DUR,KLOR-CON) 20 MEQ tablet Take 20 mEq by mouth 2 (two) times daily.    [provider]  potassium chloride SA (KLOR-CON) 20 MEQ tablet TAKE 2 TABLETS BY MOUTH DAILY 05/11/20     pravastatin (PRAVACHOL) 20 MG tablet TAKE 1 TABLET BY MOUTH EVERY EVENING 01/16/20 01/15/21  Berkley Harvey, NP  predniSONE (DELTASONE) 10 MG tablet TAKE 3 TABLETS BY MOUTH ONCE A DAY FOR 3 DAYS, 2 TABLETS DAILY FOR 3 DAYS, THEN 1 TABLET DAILY FOR 3 DAYS 02/20/20 02/19/21  Berkley Harvey, NP  predniSONE (DELTASONE) 50 MG tablet Take daily with breakfast 09/30/13   Waldemar Dickens, MD  Spacer/Aero-Holding Chambers DEVI Use as directed 09/30/13   Waldemar Dickens, MD  traMADol (ULTRAM) 50 MG tablet Take 50 mg by mouth every 6 (six) hours as needed.      [provider]  traMADol (ULTRAM) 50 MG tablet TAKE 1 TABLET BY MOUTH EVERY 8 HOURS AS NEEDED FOR PAIN 04/06/20 10/03/20  Berkley Harvey, NP  traMADol (ULTRAM) 50 MG tablet TAKE 1 TABLET BY MOUTH EVERY  8 HOURS AS NEEDED FOR PAIN 02/19/20 08/17/20  Berkley Harvey, NP  traMADol (ULTRAM) 50 MG tablet TAKE 1 TABLET BY MOUTH EVERY 8 HOURS AS NEEDED FOR PAIN 12/25/19 06/22/20  Berkley Harvey, NP  vitamin C (ASCORBIC ACID) 250 MG tablet Take 250 mg by mouth daily.      [provider]  Vitamin E 500 UNIT/GM POWD by Does not apply route. Take 500 units by mouth daily    [provider]    Physical Exam: Vitals:   05/20/20 0404 05/20/20 0405 05/20/20 0617 05/20/20 0746  BP: (!) 174/94  (!) 180/98 (!) 166/92  Pulse: 73  77  Resp: (!) 23   (!) 21  Temp: 98.6 F (37 C)     TempSrc: Oral     SpO2: 100%   99%  Weight:  (!) 159.2 kg    Height:  5\' 2"  (1.575 m)      General: 65 y.o. female resting in bed in NAD Eyes: PERRL, normal sclera ENMT: Nares patent w/o discharge, orophaynx clear, dentition normal, ears w/o discharge/lesions/ulcers Neck: Supple, trachea midline Cardiovascular: RRR, +S1, S2, no m/g/r, equal pulses throughout Respiratory: CTABL, no w/r/r, normal WOB GI: BS+, obese, NT, no masses noted, no organomegaly noted MSK: No e/c/c Skin: No rashes, bruises, ulcerations noted Neuro: A&O x 3, no focal deficits Psyc: Appropriate interaction and affect, calm/cooperative  Labs on Admission: I have personally reviewed following labs and imaging studies  CBC: Recent Labs  Lab 05/20/20 0426  WBC 7.6  NEUTROABS 3.2  HGB 11.9*  HCT 37.2  MCV 79.1*  PLT 532   Basic Metabolic Panel: Recent Labs  Lab 05/20/20 0426  NA 137  K 3.1*  CL 101  CO2 27  GLUCOSE 119*  BUN 10  CREATININE 0.68  CALCIUM 9.3   GFR: Estimated Creatinine Clearance: 106.5 mL/min (by C-G formula based on SCr of 0.68 mg/dL). Liver Function Tests: Recent Labs  Lab 05/20/20 0426  AST 22  ALT 24  ALKPHOS 89  BILITOT 0.4  PROT 7.4  ALBUMIN 3.0*   No results for input(s): LIPASE, AMYLASE in the last 168 hours. No results for input(s): AMMONIA in the last 168 hours. Coagulation  Profile: No results for input(s): INR, PROTIME in the last 168 hours. Cardiac Enzymes: No results for input(s): CKTOTAL, CKMB, CKMBINDEX, TROPONINI in the last 168 hours. BNP (last 3 results) No results for input(s): PROBNP in the last 8760 hours. HbA1C: No results for input(s): HGBA1C in the last 72 hours. CBG: No results for input(s): GLUCAP in the last 168 hours. Lipid Profile: No results for input(s): CHOL, HDL, LDLCALC, TRIG, CHOLHDL, LDLDIRECT in the last 72 hours. Thyroid Function Tests: No results for input(s): TSH, T4TOTAL, FREET4, T3FREE, THYROIDAB in the last 72 hours. Anemia Panel: No results for input(s): VITAMINB12, FOLATE, FERRITIN, TIBC, IRON, RETICCTPCT in the last 72 hours. 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 Hx HTN. Pt reports being out of HTN medication x 1 week and feeling "funny" with dizziness and palpitations. EXAM: CT HEAD WITHOUT CONTRAST TECHNIQUE: Contiguous axial images were obtained from the base of the skull through the vertex without intravenous contrast. COMPARISON:  None. FINDINGS: Brain: No evidence of large-territorial acute infarction. No parenchymal hemorrhage. No mass lesion. No extra-axial collection. No mass effect or midline shift. No hydrocephalus. Basilar cisterns are patent. Vascular: No hyperdense vessel. Skull: No acute fracture or focal lesion. Sinuses/Orbits: Paranasal sinuses and mastoid air cells are clear. The orbits are unremarkable. Other: None. IMPRESSION: No acute intracranial abnormality. Electronically Signed   By: Iven Finn M.D.   On: 05/20/2020 04:55   CT Cervical Spine Wo Contrast  Result Date: 05/20/2020 CLINICAL DATA:  Neck  trauma with focal neuro deficit. EXAM: CT CERVICAL SPINE WITHOUT CONTRAST TECHNIQUE: Multidetector CT imaging of the cervical spine was performed without intravenous contrast. Multiplanar CT image reconstructions were also generated. COMPARISON:  None. FINDINGS: Alignment: Normal. Skull base and vertebrae: No acute fracture. No primary bone lesion or focal pathologic process. Soft tissues and spinal canal: No prevertebral fluid or swelling. No visible canal hematoma. Disc levels:  No focal or significant degenerative changes. Upper chest: Negative IMPRESSION: Negative for cervical spine fracture or subluxation. Electronically Signed   By: Monte Fantasia M.D.   On: 05/20/2020 07:17   DG Chest Portable 1 View  Result Date: 05/20/2020 CLINICAL DATA:  Syncope EXAM: PORTABLE CHEST 1 VIEW COMPARISON:  07/28/2003 FINDINGS: Chronic cardiomegaly and vascular pedicle widening. Congested appearance of vessels with no definite Kerley lines and no pleural fluid. No pneumothorax. IMPRESSION: Cardiomegaly and vascular congestion. Electronically Signed   By: Monte Fantasia M.D.   On: 05/20/2020 04:53    EKG: Independently reviewed. NSR, no st elevations  Assessment/Plan Syncope/Near Syncope     - admit to obs, tele     - unclear if she actually passed out     - CXR showed some vascular congestion, BNP is normal, trp normal, EKG ok; she does endorse DOE and orthopnea     - will check ehco, orthostatics     - history not c/w seizure; no need for EEG right now     - she states she is feeling better after BP meds, so likely this was the cause     - hold her black cohosh  HTN     - has been out of meds for at least 1 week     - received amlodipine, lisinopril and lasix in ED     - resume clonidine; follow  Hypokalemia     - replace, check Mg2+  Headache     - CTH and CT c-spine negative     - likely secondary to HTN     - PRN pain meds, treat HTN  Morbid obesity     - counsel on lifestyle/diet  changes  DVT prophylaxis: lovenox  Code Status: FULL  Family Communication: None at bedside.   Consults called: None   Status is: Observation  The patient remains OBS appropriate and will d/c before 2 midnights.  Dispo: The patient is from: Home              Anticipated d/c is to: Home              Patient currently is not medically stable to d/c.   Difficult to place patient No  Jonnie Finner DO [facility]  If 7PM-7AM, please contact night-coverage [email or web address]  05/20/2020, 9:06 AM

## 2020-05-20 NOTE — ED Provider Notes (Signed)
[facility] EMERGENCY DEPARTMENT Provider Note   CSN: [id number] Arrival date & time: 05/20/20  0350     History Chief Complaint  Patient presents with  . Near Syncope    fa  . Fall  . Hypertension    Ashley Warren is a 65 y.o. female.   Near Syncope Associated symptoms include shortness of breath. Pertinent negatives include no chest pain and no abdominal pain.  Fall Associated symptoms include shortness of breath. Pertinent negatives include no chest pain and no abdominal pain.  Hypertension Associated symptoms include shortness of breath. Pertinent negatives include no chest pain and no abdominal pain.  Patient presents with shortness of breath.  Feeling her heartbeat in her chest.  Feeling dizzy.  States she has been more short of breath.  Has been out of her blood pressure medicines for about a week now.  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  States that she is unable to sleep flat anymore.  States she does have a bed that raise her self up.  States she has some swelling in her legs.  States she does not have chest pain but cannot walk around like she used to.  States she will feel her heart beat in her chest.  No abdominal pain.  States she has back pain and hurts all over.  States she hurts more when she walks around. Patient has been out of her blood pressure medicines for about a week since she states the pharmacy symptoms to her old address    Past Medical History:  Diagnosis Date  . Hypertension     Patient Active Problem List   Diagnosis Date Noted  . Low back pain 04/29/2015  . Lumbar facet arthropathy 04/29/2015  . Trochanteric bursitis of both hips 04/29/2015  . Morbid obesity (HCC) 04/29/2015    Past Surgical History:  Procedure Laterality Date  . ABDOMINAL HYSTERECTOMY    . APPENDECTOMY    . COSMETIC SURGERY     mass removed from shoulder/scapula area     OB History   No obstetric history on file.     Family  History  Problem Relation Age of Onset  . Alcohol abuse Maternal Grandmother   . Autism spectrum disorder Sister   . Breast cancer Mother   . Depression Sister   . Hypertension Mother   . Hypertension Sister   . Osteoarthritis Maternal Grandmother     Social History   Tobacco Use  . Smoking status: Never Smoker  . Smokeless tobacco: Never Used  Substance Use Topics  . Alcohol use: Yes    Comment: ocially  . Drug use: No    Home Medications Prior to Admission medications   Medication Sig Start Date End Date Taking? Authorizing Provider  albuterol (PROVENTIL HFA;VENTOLIN HFA) 108 (90 BASE) MCG/ACT inhaler Inhale 2 puffs into the lungs every 4 (four) hours as needed for wheezing or shortness of breath. 09/30/13   Ozella Rocks, MD  amLODipine (NORVASC) 10 MG tablet Take 10 mg by mouth daily.      [provider]  amLODipine (NORVASC) 10 MG tablet TAKE 1 TABLET BY MOUTH ONCE A DAY 02/04/20 02/03/21  Iona Hansen, NP  amLODipine (NORVASC) 10 MG tablet TAKE 1 TABLET BY MOUTH ONCE DAILY 05/11/20     benzonatate (TESSALON) 100 MG capsule TAKE 1 CAPSULE BY MOUTH 3 TIMES DAILY AS NEEDED FOR COUGH FOR UP TO 7 DAYS 02/20/20 02/19/21  Iona Hansen, NP  Boswellia Serrata Extract POWD 375 mg by Does not apply route. Take 375 mg 2 times a week.    [provider]  budesonide-formoterol (SYMBICORT) 160-4.5 MCG/ACT inhaler Inhale 2 puffs into the lungs 2 (two) times daily.    [provider]  cetirizine (ZYRTEC) 10 MG tablet Take 10 mg by mouth daily.    [provider]  clindamycin-benzoyl peroxide (BENZACLIN) gel APPLY TOPICALLY TWICE A DAY 03/09/20 03/09/21  Berkley Harvey, NP  cloNIDine (CATAPRES) 0.1 MG tablet Take 0.1 mg by mouth 2 (two) times daily.      [provider]  cloNIDine (CATAPRES) 0.2 MG tablet TAKE 1 TABLET BY MOUTH 2 TIMES DAILY 06/18/19 06/17/20  Berkley Harvey, NP  diclofenac Sodium (VOLTAREN) 1 % GEL USE 4 GRAMS 3 TIMES DAILY TO AFFECTED  AREA(S) 07/03/19 07/02/20  Berkley Harvey, NP  fish oil-omega-3 fatty acids 1000 MG capsule Take 2 g by mouth daily.      [provider]  fluticasone (FLONASE) 50 MCG/ACT nasal spray Place 2 sprays into both nostrils daily. 09/30/13   Waldemar Dickens, MD  hydrochlorothiazide (HYDRODIURIL) 25 MG tablet TAKE 1 TABLET BY MOUTH ONCE DAILY 11/04/19 11/03/20  Berkley Harvey, NP  hydrochlorothiazide (HYDRODIURIL) 25 MG tablet TAKE 1 TABLET BY MOUTH ONCE DAILY 05/11/20     hydrochlorothiazide 25 MG tablet Take 25 mg by mouth daily.      [provider]  hydrOXYzine (ATARAX/VISTARIL) 25 MG tablet TAKE 1 TABLET BY MOUTH DAILY AS NEEDED FOR ANXIETY/SLEEP 03/09/20 03/09/21  Berkley Harvey, NP  hydrOXYzine (ATARAX/VISTARIL) 25 MG tablet Take 1 tablet by mouth daily if needed for anxiety/sleep. 03/09/20     lisinopril (PRINIVIL,ZESTRIL) 40 MG tablet Take 40 mg by mouth 2 (two) times daily.      [provider]  lisinopril (ZESTRIL) 40 MG tablet TAKE 1 TABLET BY MOUTH TWO TIMES DAILY 10/03/19 10/02/20  Berkley Harvey, NP  lisinopril (ZESTRIL) 40 MG tablet TAKE 1 TABLET BY MOUTH TWICE DAILY 05/11/20     pantoprazole (PROTONIX) 40 MG tablet Take 40 mg by mouth daily.    [provider]  pantoprazole (PROTONIX) 40 MG tablet TAKE 1 TABLET BY MOUTH ONCE A DAY 04/09/20 04/09/21  Berkley Harvey, NP  pantoprazole (PROTONIX) 40 MG tablet TAKE 1 TABLET BY MOUTH ONCE A DAY 01/09/20 01/08/21  Berkley Harvey, NP  potassium chloride SA (K-DUR,KLOR-CON) 20 MEQ tablet Take 20 mEq by mouth 2 (two) times daily.    [provider]  potassium chloride SA (KLOR-CON) 20 MEQ tablet TAKE 2 TABLETS BY MOUTH DAILY 05/11/20     pravastatin (PRAVACHOL) 20 MG tablet TAKE 1 TABLET BY MOUTH EVERY EVENING 01/16/20 01/15/21  Berkley Harvey, NP  predniSONE (DELTASONE) 10 MG tablet TAKE 3 TABLETS BY MOUTH ONCE A DAY FOR 3 DAYS, 2 TABLETS DAILY FOR 3 DAYS, THEN 1 TABLET DAILY FOR 3 DAYS 02/20/20 02/19/21  Berkley Harvey, NP   predniSONE (DELTASONE) 50 MG tablet Take daily with breakfast 09/30/13   Waldemar Dickens, MD  Spacer/Aero-Holding Chambers DEVI Use as directed 09/30/13   Waldemar Dickens, MD  traMADol (ULTRAM) 50 MG tablet Take 50 mg by mouth every 6 (six) hours as needed.      [provider]  traMADol (ULTRAM) 50 MG tablet TAKE 1 TABLET BY MOUTH EVERY 8 HOURS AS NEEDED FOR PAIN 04/06/20 10/03/20  Berkley Harvey, NP  traMADol (ULTRAM) 50 MG tablet TAKE 1  TABLET BY MOUTH EVERY 8 HOURS AS NEEDED FOR PAIN 02/19/20 08/17/20  Berkley Harvey, NP  traMADol (ULTRAM) 50 MG tablet TAKE 1 TABLET BY MOUTH EVERY 8 HOURS AS NEEDED FOR PAIN 12/25/19 06/22/20  Berkley Harvey, NP  vitamin C (ASCORBIC ACID) 250 MG tablet Take 250 mg by mouth daily.      [provider]  Vitamin E 500 UNIT/GM POWD by Does not apply route. Take 500 units by mouth daily    [provider]    Allergies    Penicillins  Review of Systems   Review of Systems  Constitutional: Negative for appetite change and fever.  HENT: Negative for congestion.   Respiratory: Positive for shortness of breath.   Cardiovascular: Positive for palpitations, leg swelling and near-syncope. Negative for chest pain.  Gastrointestinal: Negative for abdominal pain.  Genitourinary: Negative for flank pain.  Musculoskeletal: Positive for back pain.  Skin: Positive for wound.  Neurological: Negative for weakness.  Psychiatric/Behavioral: Negative for confusion.    Physical Exam Updated Vital Signs BP (!) 174/94 (BP Location: Right Arm)   Pulse 73   Temp 98.6 F (37 C) (Oral)   Resp (!) 23   Ht 5\' 2"  (1.575 m)   Wt (!) 159.2 kg   SpO2 100%   BMI 64.20 kg/m   Physical Exam Vitals and nursing note reviewed.  Constitutional:      Appearance: She is obese.  HENT:     Head: Normocephalic.     Comments: Superficial laceration to right anterior zygomatic arch area.  No underlying bony tenderness.  Eye movements intact. Eyes:     Pupils:  Pupils are equal, round, and reactive to light.  Cardiovascular:     Rate and Rhythm: Regular rhythm.  Pulmonary:     Breath sounds: No wheezing or rhonchi.  Abdominal:     Tenderness: There is no abdominal tenderness.  Musculoskeletal:     Cervical back: Neck supple.     Right lower leg: Edema present.     Left lower leg: Edema present.     Comments: Pitting edema bilateral lower extremities.  Skin:    Capillary Refill: Capillary refill takes less than 2 seconds.  Neurological:     Mental Status: She is alert and oriented to person, place, and time.  Psychiatric:        Mood and Affect: Mood normal.     ED Results / Procedures / Treatments   Labs (all labs ordered are listed, but only abnormal results are displayed) Labs Reviewed  CBC WITH DIFFERENTIAL/PLATELET - Abnormal; Notable for the following components:      Result Value   Hemoglobin 11.9 (*)    MCV 79.1 (*)    MCH 25.3 (*)    RDW 17.4 (*)    All other components within normal limits  COMPREHENSIVE METABOLIC PANEL - Abnormal; Notable for the following components:   Potassium 3.1 (*)    Glucose, Bld 119 (*)    Albumin 3.0 (*)    All other components within normal limits  RESP PANEL BY RT-PCR (FLU A&B, COVID) [id number]  BRAIN NATRIURETIC PEPTIDE  TROPONIN I (HIGH SENSITIVITY)  TROPONIN I (HIGH SENSITIVITY)    EKG EKG Interpretation  Date/Time:  Wednesday May 20 2020 03:56:49 EDT Ventricular Rate:  70 PR Interval:  185 QRS Duration: 107 QT Interval:  412 QTC Calculation: 445 R Axis:   -32 Text Interpretation: Sinus rhythm Left axis deviation No significant change since last tracing Confirmed by  Davonna Belling [phone]) on 05/20/2020 3:59:38 AM   Radiology DG Chest Portable 1 View  Result Date: 05/20/2020 CLINICAL DATA:  Syncope EXAM: PORTABLE CHEST 1 VIEW COMPARISON:  07/28/2003 FINDINGS: Chronic cardiomegaly and vascular pedicle widening. Congested appearance of vessels with no definite Kerley lines and  no pleural fluid. No pneumothorax. IMPRESSION: Cardiomegaly and vascular congestion. Electronically Signed   By: Monte Fantasia M.D.   On: 05/20/2020 04:53    Procedures Procedures   Medications Ordered in ED Medications  amLODipine (NORVASC) tablet 10 mg (has no administration in time range)  lisinopril (ZESTRIL) tablet 40 mg (has no administration in time range)  furosemide (LASIX) injection 40 mg (has no administration in time range)    ED Course  I have reviewed the triage vital signs and the nursing notes.  Pertinent labs & imaging results that were available during my care of the patient were reviewed by me and considered in my medical decision making (see chart for details).    MDM Rules/Calculators/A&P                          Patient presents after shortness of breath feeling funny dizziness.  Some chest palpitations/feeling be heartbeat in her upper chest.  States she gets much worse with exertion.  States she feels as if she will pass out and gets more short of breath if she tries to walk around.  States she cannot lay as flat as she normally does.  Has been out of her medicines for around a week.  Chest x-ray shows some volume overload.  Has edema on both her legs.  BNP however reassuring.  Troponin negative.  Negative COVID test.  Hit head and head CT reassuring.  However we will add cervical spine CT now that she states there is a little bit of tingling in her left upper extremity.  I am worried due to exertional shortness of breath and syncope.  Blood pressure somewhat elevated will treat here.  Will give Lasix and start her home medicines.  Patient really does not want to be admitted, however I think she is high risk with the exertional symptoms.  Will discuss with hospitalist and admit. Final Clinical Impression(s) / ED Diagnoses Final diagnoses:  Near syncope  Hypertension, unspecified type    Rx / DC Orders ED Discharge Orders    None       Davonna Belling,  MD 05/20/20 [phone]

## 2020-05-20 NOTE — Progress Notes (Signed)
  Echocardiogram 2D Echocardiogram has been performed.  Ashley Warren 05/20/2020, 3:05 PM

## 2020-05-21 ENCOUNTER — Other Ambulatory Visit (HOSPITAL_COMMUNITY): Payer: Self-pay

## 2020-05-21 DIAGNOSIS — Z20822 Contact with and (suspected) exposure to covid-19: Secondary | ICD-10-CM | POA: Diagnosis not present

## 2020-05-21 DIAGNOSIS — I159 Secondary hypertension, unspecified: Secondary | ICD-10-CM

## 2020-05-21 DIAGNOSIS — I160 Hypertensive urgency: Secondary | ICD-10-CM

## 2020-05-21 DIAGNOSIS — I10 Essential (primary) hypertension: Secondary | ICD-10-CM

## 2020-05-21 DIAGNOSIS — R55 Syncope and collapse: Secondary | ICD-10-CM | POA: Diagnosis not present

## 2020-05-21 DIAGNOSIS — Z79899 Other long term (current) drug therapy: Secondary | ICD-10-CM | POA: Diagnosis not present

## 2020-05-21 LAB — COMPREHENSIVE METABOLIC PANEL
ALT: 25 U/L (ref 0–44)
AST: 20 U/L (ref 15–41)
Albumin: 3.2 g/dL — ABNORMAL LOW (ref 3.5–5.0)
Alkaline Phosphatase: 79 U/L (ref 38–126)
Anion gap: 6 (ref 5–15)
BUN: 10 mg/dL (ref 8–23)
CO2: 31 mmol/L (ref 22–32)
Calcium: 9.7 mg/dL (ref 8.9–10.3)
Chloride: 101 mmol/L (ref 98–111)
Creatinine, Ser: 0.72 mg/dL (ref 0.44–1.00)
GFR, Estimated: >60 mL/min (ref >60)
Glucose, Bld: 91 mg/dL (ref 70–99)
Potassium: 3.8 mmol/L (ref 3.5–5.1)
Sodium: 138 mmol/L (ref 135–145)
Total Bilirubin: 0.3 mg/dL (ref 0.3–1.2)
Total Protein: 7.2 g/dL (ref 6.5–8.1)

## 2020-05-21 LAB — CBC
HCT: 41.2 % (ref 36.0–46.0)
Hemoglobin: 13 g/dL (ref 12.0–15.0)
MCH: 25.3 pg — ABNORMAL LOW (ref 26.0–34.0)
MCHC: 31.6 g/dL (ref 30.0–36.0)
MCV: 80.3 fL (ref 80.0–100.0)
Platelets: 265 10*3/uL (ref 150–400)
RBC: 5.13 MIL/uL — ABNORMAL HIGH (ref 3.87–5.11)
RDW: 17.3 % — ABNORMAL HIGH (ref 11.5–15.5)
WBC: 6.4 10*3/uL (ref 4.0–10.5)
nRBC: 0 % (ref 0.0–0.2)

## 2020-05-21 LAB — GLUCOSE, CAPILLARY: Glucose-Capillary: 100 mg/dL — ABNORMAL HIGH (ref 70–99)

## 2020-05-21 LAB — HIV ANTIBODY (ROUTINE TESTING W REFLEX): HIV Screen 4th Generation wRfx: NONREACTIVE

## 2020-05-21 MED ORDER — IBUPROFEN 200 MG PO TABS
800.0000 mg | ORAL_TABLET | Freq: Two times a day (BID) | ORAL | 0 refills | Status: DC | PRN
  Filled 2020-05-21: qty 30, 4d supply, fill #0

## 2020-05-21 MED ORDER — LISINOPRIL 40 MG PO TABS: 40.0000 mg | ORAL_TABLET | Freq: Every day | ORAL | 1 refills | Status: DC

## 2020-05-21 NOTE — TOC Transition Note (Addendum)
Transition of Care [facility]) - CM/SW Discharge Note   Patient Details  Name: Ashley Warren MRN: [id number] Date of Birth: 1955/06/02  Transition of Care [facility] CM/SW Contact:  Dessa Phi, RN Phone Number: 05/21/2020, 11:58 AM   Clinical Narrative:d/c home today. CM called [facility]- for w/c transport home-confirmed patient's address-Nsg arranged for 1p pick up. CM contacted [facility] pharmacy-confirmed address-has refills for BP meds-HTZ,amlodipine, lisinopril. No further CM needs.  1:19p-Nsg/CM went into rm to discuss transportation concerns-now patient has cntacted her sister & both agreed to patient using [facility] for pick up from main entrance going to patient's sister's home a one way ride-patient's sister will drive patient to her car at [facility] med center. No further CM needs.      Final next level of care: Home/Self Care Barriers to Discharge: No Barriers Identified   Patient Goals and CMS Choice Patient states their goals for this hospitalization and ongoing recovery are:: go home CMS Medicare.gov Compare Post Acute Care list provided to:: Patient Choice offered to / list presented to : Patient  Discharge Placement                       Discharge Plan and Services   Discharge Planning Services: CM Consult                                 Social Determinants of Health (SDOH) Interventions     Readmission Risk Interventions No flowsheet data found.

## 2020-05-21 NOTE — Discharge Summary (Signed)
Physician Discharge Summary   Ashley Warren [id number] DOB: Oct 15, 1955 DOA: 05/20/2020  PCP: Berkley Harvey, NP  Admit date: 05/20/2020 Discharge date: 05/21/2020   Admitted From: home Disposition:  Home Discharging physician: Dwyane Dee, MD  Recommendations for Outpatient Follow-up:  1. Follow up BP and adjust regimen as needed   Patient discharged to home in Discharge Condition: stable Risk of unplanned readmission score:    CODE STATUS: Full Diet recommendation:  Diet Orders (From admission, onward)    Start     Ordered   05/21/20 0000  Diet - low sodium heart healthy        05/21/20 1218   05/20/20 0925  Diet Heart Room service appropriate? Yes; Fluid consistency: Thin  Diet effective now       Question Answer Comment  Room service appropriate? Yes   Fluid consistency: Thin      05/20/20 0925          Hospital Course: Ms. Raggio is a 65  Yo female with PMH HTN, GERD, obesity who initially presented to [place] with complaints of dizziness and headache.  She was found to have significantly elevated blood pressure consistent with hypertensive urgency.  She had run out of blood pressure medications and refill prescriptions have been sent to a former address.  She was out of medications for approximately 10 days.  She was transferred to [facility] for further pressure control and further work-up.  Home medications were restarted and blood pressure slowly down trended.  An echo was performed.  EF greater than [id number], grade 1 diastolic dysfunction. Given headache, CT head obtained as well which was negative for acute abnormalities.  CT cervical spine was also checked while in the ER and also unremarkable.  CXR did show some possible vascular congestion. She did not require oxygen and her symptoms improved as her BP returned to near normal range.  It was confirmed that she had adequate prescriptions with refills and the correct address with her pharmacy  prior to discharge.   The patient's chronic medical conditions were treated accordingly per the patient's home medication regimen except as noted.  On day of discharge, patient was felt deemed stable for discharge. Patient/family member advised to call PCP or come back to ER if needed.   Principal Diagnosis: Near syncope  Discharge Diagnoses: Active Hospital Problems   Diagnosis Date Noted  . HTN (hypertension) 05/21/2020  . Morbid obesity ([place]) 04/29/2015    Resolved Hospital Problems   Diagnosis Date Noted Date Resolved  . Near syncope 05/20/2020 05/21/2020    Priority: High  . Hypertensive urgency 05/21/2020 05/21/2020    Priority: High    Discharge Instructions    Diet - low sodium heart healthy   Complete by: As directed    Increase activity slowly   Complete by: As directed      Allergies as of 05/21/2020      Reactions   Penicillins Itching      Medication List    STOP taking these medications   benzonatate 100 MG capsule Commonly known as: TESSALON   clindamycin-benzoyl peroxide gel Commonly known as: BENZACLIN   predniSONE 10 MG tablet Commonly known as: DELTASONE     TAKE these medications   albuterol 108 (90 Base) MCG/ACT inhaler Commonly known as: VENTOLIN HFA Inhale 2 puffs into the lungs every 4 (four) hours as needed for wheezing or shortness of breath.   amLODipine 10 MG tablet Commonly known as: NORVASC TAKE  1 TABLET BY MOUTH ONCE DAILY What changed: Another medication with the same name was removed. Continue taking this medication, and follow the directions you see here.   B COMPLEX PO Take 1 tablet by mouth daily.   BACILLUS COAGULANS-INULIN PO Take 2 tablets by mouth daily.   Black Cohosh 160 MG Caps Take 160 mg by mouth daily.   BOSWELLIA PO Take 375 mg by mouth daily.   budesonide-formoterol 160-4.5 MCG/ACT inhaler Commonly known as: SYMBICORT Inhale 2 puffs into the lungs 2 (two) times daily.   calcium carbonate 1250 (500  Ca) MG chewable tablet Commonly known as: OS-CAL Chew 1,250 mg by mouth daily.   cloNIDine 0.2 MG tablet Commonly known as: CATAPRES TAKE 1 TABLET BY MOUTH 2 TIMES DAILY What changed: how much to take   diclofenac Sodium 1 % Gel Commonly known as: VOLTAREN USE 4 GRAMS 3 TIMES DAILY TO AFFECTED AREA(S) What changed:   how much to take  how to take this  when to take this  reasons to take this   ergocalciferol 1.25 MG (50000 UT) capsule Commonly known as: VITAMIN D2 Take 50,000 Units by mouth 2 (two) times a week. No specific days   hydrochlorothiazide 25 MG tablet Commonly known as: HYDRODIURIL TAKE 1 TABLET BY MOUTH ONCE DAILY What changed: Another medication with the same name was removed. Continue taking this medication, and follow the directions you see here.   hydrOXYzine 25 MG tablet Commonly known as: ATARAX/VISTARIL TAKE 1 TABLET BY MOUTH DAILY AS NEEDED FOR ANXIETY/SLEEP What changed:   how much to take  how to take this  when to take this  reasons to take this   ibuprofen 200 MG tablet Commonly known as: ADVIL Take 4 tablets by mouth 2  times daily as needed. What changed:   when to take this  reasons to take this   lisinopril 40 MG tablet Commonly known as: ZESTRIL Take 1 tablet (40 mg total) by mouth daily. What changed:   when to take this  Another medication with the same name was removed. Continue taking this medication, and follow the directions you see here.   Milk Thistle 150 MG Caps Take 150 mg by mouth daily.   mupirocin nasal ointment 2 % Commonly known as: BACTROBAN Place 1 application into the nose 2 (two) times daily as needed (nasal itching). Use one-half of tube in each nostril twice daily for five (5) days. After application, press sides of nose together and gently massage.   OVER THE COUNTER MEDICATION Take 2 capsules by mouth daily. FISH OIL OMEGA 3 VIT C VIT E 2000-650-12MG    pantoprazole 40 MG tablet Commonly known  as: PROTONIX TAKE 1 TABLET BY MOUTH ONCE A DAY What changed:   how much to take  Another medication with the same name was removed. Continue taking this medication, and follow the directions you see here.   polyvinyl alcohol 1.4 % ophthalmic solution Commonly known as: LIQUIFILM TEARS Place 1 drop into both eyes daily.   potassium chloride SA 20 MEQ tablet Commonly known as: KLOR-CON TAKE 2 TABLETS BY MOUTH DAILY   pravastatin 20 MG tablet Commonly known as: PRAVACHOL TAKE 1 TABLET BY MOUTH EVERY EVENING What changed: how much to take   [employer] Use as directed   traMADol 50 MG tablet Commonly known as: ULTRAM TAKE 1 TABLET BY MOUTH EVERY 8 HOURS AS NEEDED FOR PAIN What changed:   how much to take  when to take  this  additional instructions  Another medication with the same name was removed. Continue taking this medication, and follow the directions you see here.   vitamin C 250 MG tablet Commonly known as: ASCORBIC ACID Take 250 mg by mouth daily.   Vitamin C CR 500 MG Cpcr Take 500 mg by mouth daily.       Allergies  Allergen Reactions  . Penicillins Itching    Consultations:   Discharge Exam: BP (!) 171/86   Pulse 61   Temp 98.3 F (36.8 C) (Oral)   Resp 18   Ht 5\' 2"  (1.575 m)   Wt (!) 156.2 kg   SpO2 98%   BMI 62.97 kg/m  General appearance: alert, cooperative and no distress Head: Normocephalic, without obvious abnormality, atraumatic Eyes: EOMI Lungs: clear to auscultation bilaterally Heart: regular rate and rhythm and S1, S2 normal Abdomen: normal findings: bowel sounds normal and soft, non-tender Extremities: no edema Skin: mobility and turgor normal Neurologic: Grossly normal  The results of significant diagnostics from this hospitalization (including imaging, microbiology, ancillary and laboratory) are listed below for reference.   Microbiology: Recent Results (from the past 240 hour(s))  Resp Panel by  RT-PCR (Flu A&B, Covid) Nasopharyngeal Swab     Status: None   Collection Time: 05/20/20  4:50 AM   Specimen: Nasopharyngeal Swab; Nasopharyngeal(NP) swabs in vial transport medium  Result Value Ref Range Status   SARS Coronavirus 2 by RT PCR NEGATIVE NEGATIVE Final    Comment: (NOTE) SARS-CoV-2 target nucleic acids are NOT DETECTED.  The SARS-CoV-2 RNA is generally detectable in upper respiratory specimens during the acute phase of infection. The lowest concentration of SARS-CoV-2 viral copies this assay can detect is 138 copies/mL. A negative result does not preclude SARS-Cov-2 infection and should not be used as the sole basis for treatment or other patient management decisions. A negative result may occur with  improper specimen collection/handling, submission of specimen other than nasopharyngeal swab, presence of viral mutation(s) within the areas targeted by this assay, and inadequate number of viral copies(<138 copies/mL). A negative result must be combined with clinical observations, patient history, and epidemiological information. The expected result is Negative.  Fact Sheet for Patients:  EntrepreneurPulse.com.au  Fact Sheet for Healthcare Providers:  IncredibleEmployment.be  This test is no t yet approved or cleared by the [place] FDA and  has been authorized for detection and/or diagnosis of SARS-CoV-2 by FDA under an Emergency Use Authorization (EUA). This EUA will remain  in effect (meaning this test can be used) for the duration of the COVID-19 declaration under Section 564(b)(1) of the Act, 21 U.S.C.section 360bbb-3(b)(1), unless the authorization is terminated  or revoked sooner.       Influenza A by PCR NEGATIVE NEGATIVE Final   Influenza B by PCR NEGATIVE NEGATIVE Final    Comment: (NOTE) The Xpert Xpress SARS-CoV-2/FLU/RSV plus assay is intended as an aid in the diagnosis of influenza from Nasopharyngeal swab  specimens and should not be used as a sole basis for treatment. Nasal washings and aspirates are unacceptable for Xpert Xpress SARS-CoV-2/FLU/RSV testing.  Fact Sheet for Patients: EntrepreneurPulse.com.au  Fact Sheet for Healthcare Providers: IncredibleEmployment.be  This test is not yet approved or cleared by the [place] FDA and has been authorized for detection and/or diagnosis of SARS-CoV-2 by FDA under an Emergency Use Authorization (EUA). This EUA will remain in effect (meaning this test can be used) for the duration of the COVID-19 declaration under Section 564(b)(1) of the Act,  21 U.S.C. section 360bbb-3(b)(1), unless the authorization is terminated or revoked.  Performed at [facility], [address] [place], [place] [address]      Labs: BNP (last 3 results) Recent Labs    05/20/20 0426  BNP 41.6   Basic Metabolic Panel: Recent Labs  Lab 05/20/20 0426 05/20/20 1100 05/21/20 0457  NA 137  --  138  K 3.1*  --  3.8  CL 101  --  101  CO2 27  --  31  GLUCOSE 119*  --  91  BUN 10  --  10  CREATININE 0.68 0.60 0.72  CALCIUM 9.3  --  9.7  MG  --  1.6*  --    Liver Function Tests: Recent Labs  Lab 05/20/20 0426 05/21/20 0457  AST 22 20  ALT 24 25  ALKPHOS 89 79  BILITOT 0.4 0.3  PROT 7.4 7.2  ALBUMIN 3.0* 3.2*   No results for input(s): LIPASE, AMYLASE in the last 168 hours. No results for input(s): AMMONIA in the last 168 hours. CBC: Recent Labs  Lab 05/20/20 0426 05/20/20 1100 05/21/20 0457  WBC 7.6 7.2 6.4  NEUTROABS 3.2  --   --   HGB 11.9* 12.0 13.0  HCT 37.2 37.7 41.2  MCV 79.1* 80.0 80.3  PLT 278 269 265   Cardiac Enzymes: No results for input(s): CKTOTAL, CKMB, CKMBINDEX, TROPONINI in the last 168 hours. BNP: Invalid input(s): POCBNP CBG: Recent Labs  Lab 05/21/20 0608  GLUCAP 100*   D-Dimer No results for input(s): DDIMER in the last 72 hours. Hgb A1c No results for  input(s): HGBA1C in the last 72 hours. Lipid Profile No results for input(s): CHOL, HDL, LDLCALC, TRIG, CHOLHDL, LDLDIRECT in the last 72 hours. Thyroid function studies No results for input(s): TSH, T4TOTAL, T3FREE, THYROIDAB in the last 72 hours.  Invalid input(s): FREET3 Anemia work up No results for input(s): VITAMINB12, FOLATE, FERRITIN, TIBC, IRON, RETICCTPCT in the last 72 hours. Urinalysis No results found for: COLORURINE, APPEARANCEUR, [place], [place], GLUCOSEU, [place], [place], [place], PROTEINUR, UROBILINOGEN, NITRITE, LEUKOCYTESUR Sepsis Labs Invalid input(s): PROCALCITONIN,  WBC,  LACTICIDVEN Microbiology Recent Results (from the past 240 hour(s))  Resp Panel by RT-PCR (Flu A&B, Covid) Nasopharyngeal Swab     Status: None   Collection Time: 05/20/20  4:50 AM   Specimen: Nasopharyngeal Swab; Nasopharyngeal(NP) swabs in vial transport medium  Result Value Ref Range Status   SARS Coronavirus 2 by RT PCR NEGATIVE NEGATIVE Final    Comment: (NOTE) SARS-CoV-2 target nucleic acids are NOT DETECTED.  The SARS-CoV-2 RNA is generally detectable in upper respiratory specimens during the acute phase of infection. The lowest concentration of SARS-CoV-2 viral copies this assay can detect is 138 copies/mL. A negative result does not preclude SARS-Cov-2 infection and should not be used as the sole basis for treatment or other patient management decisions. A negative result may occur with  improper specimen collection/handling, submission of specimen other than nasopharyngeal swab, presence of viral mutation(s) within the areas targeted by this assay, and inadequate number of viral copies(<138 copies/mL). A negative result must be combined with clinical observations, patient history, and epidemiological information. The expected result is Negative.  Fact Sheet for Patients:  EntrepreneurPulse.com.au  Fact Sheet for Healthcare Providers:   IncredibleEmployment.be  This test is no t yet approved or cleared by the [place] FDA and  has been authorized for detection and/or diagnosis of SARS-CoV-2 by FDA under an Emergency Use Authorization (EUA). This EUA  will remain  in effect (meaning this test can be used) for the duration of the COVID-19 declaration under Section 564(b)(1) of the Act, 21 U.S.C.section 360bbb-3(b)(1), unless the authorization is terminated  or revoked sooner.       Influenza A by PCR NEGATIVE NEGATIVE Final   Influenza B by PCR NEGATIVE NEGATIVE Final    Comment: (NOTE) The Xpert Xpress SARS-CoV-2/FLU/RSV plus assay is intended as an aid in the diagnosis of influenza from Nasopharyngeal swab specimens and should not be used as a sole basis for treatment. Nasal washings and aspirates are unacceptable for Xpert Xpress SARS-CoV-2/FLU/RSV testing.  Fact Sheet for Patients: EntrepreneurPulse.com.au  Fact Sheet for Healthcare Providers: IncredibleEmployment.be  This test is not yet approved or cleared by the [place] FDA and has been authorized for detection and/or diagnosis of SARS-CoV-2 by FDA under an Emergency Use Authorization (EUA). This EUA will remain in effect (meaning this test can be used) for the duration of the COVID-19 declaration under Section 564(b)(1) of the Act, 21 U.S.C. section 360bbb-3(b)(1), unless the authorization is terminated or revoked.  Performed at [facility], [address] [place], [place] [address]     Procedures/Studies: CT Head Wo Contrast  Result Date: 05/20/2020 CLINICAL DATA:  Pt fell out of chair in registration as RN and RT went to check on her. Hx HTN. Pt reports being out of HTN medication x 1 week and feeling "funny" with dizziness and palpitations. EXAM: CT HEAD WITHOUT CONTRAST TECHNIQUE: Contiguous axial images were obtained from the base of the skull through the vertex  without intravenous contrast. COMPARISON:  None. FINDINGS: Brain: No evidence of large-territorial acute infarction. No parenchymal hemorrhage. No mass lesion. No extra-axial collection. No mass effect or midline shift. No hydrocephalus. Basilar cisterns are patent. Vascular: No hyperdense vessel. Skull: No acute fracture or focal lesion. Sinuses/Orbits: Paranasal sinuses and mastoid air cells are clear. The orbits are unremarkable. Other: None. IMPRESSION: No acute intracranial abnormality. Electronically Signed   By: Iven Finn M.D.   On: 05/20/2020 04:55   CT Cervical Spine Wo Contrast  Result Date: 05/20/2020 CLINICAL DATA:  Neck trauma with focal neuro deficit. EXAM: CT CERVICAL SPINE WITHOUT CONTRAST TECHNIQUE: Multidetector CT imaging of the cervical spine was performed without intravenous contrast. Multiplanar CT image reconstructions were also generated. COMPARISON:  None. FINDINGS: Alignment: Normal. Skull base and vertebrae: No acute fracture. No primary bone lesion or focal pathologic process. Soft tissues and spinal canal: No prevertebral fluid or swelling. No visible canal hematoma. Disc levels:  No focal or significant degenerative changes. Upper chest: Negative IMPRESSION: Negative for cervical spine fracture or subluxation. Electronically Signed   By: Monte Fantasia M.D.   On: 05/20/2020 07:17   DG Chest Portable 1 View  Result Date: 05/20/2020 CLINICAL DATA:  Syncope EXAM: PORTABLE CHEST 1 VIEW COMPARISON:  07/28/2003 FINDINGS: Chronic cardiomegaly and vascular pedicle widening. Congested appearance of vessels with no definite Kerley lines and no pleural fluid. No pneumothorax. IMPRESSION: Cardiomegaly and vascular congestion. Electronically Signed   By: Monte Fantasia M.D.   On: 05/20/2020 04:53   ECHOCARDIOGRAM COMPLETE  Result Date: 05/20/2020    ECHOCARDIOGRAM REPORT   Patient Name:   Ashley Warren Date of Exam: 05/20/2020 Medical Rec #:  [id number]           Height:        62.0 in Accession #:    [id number]          Weight:  351.0 lb Date of Birth:  1955-02-18           BSA:          2.428 m Patient Age:    52 years            BP:           166/84 mmHg Patient Gender: F                   HR:           55 bpm. Exam Location:  Inpatient Procedure: 2D Echo, Cardiac Doppler, Color Doppler and Intracardiac            Opacification Agent Indications:    Syncope  History:        Patient has no prior history of Echocardiogram examinations.                 Signs/Symptoms:Shortness of Breath and Syncope; Risk                 Factors:Morbid obesity, Sleep Apnea and Hypertension.  Sonographer:    Dustin Flock Referring Phys: [id number] Jonnie Finner  Sonographer Comments: Technically difficult study due to poor echo windows and patient is morbidly obese. Image acquisition challenging due to patient body habitus. IMPRESSIONS  1. Left ventricular ejection fraction, by estimation, is >75%. The left ventricle has hyperdynamic function. The left ventricle has no regional wall motion abnormalities. There is moderate left ventricular hypertrophy. Left ventricular diastolic parameters are consistent with Grade I diastolic dysfunction (impaired relaxation).  2. Right ventricular systolic function is normal. The right ventricular size is normal.  3. The mitral valve is normal in structure. No evidence of mitral valve regurgitation. No evidence of mitral stenosis.  4. The aortic valve was not well visualized. Aortic valve regurgitation is not visualized. No aortic stenosis is present.  5. Pulmonic valve regurgitation Not assessed. FINDINGS  Left Ventricle: Left ventricular ejection fraction, by estimation, is >75%. The left ventricle has hyperdynamic function. The left ventricle has no regional wall motion abnormalities. Definity contrast agent was given IV to delineate the left ventricular endocardial borders. The left ventricular internal cavity size was normal in size. There is moderate left  ventricular hypertrophy. Left ventricular diastolic parameters are consistent with Grade I diastolic dysfunction (impaired relaxation). Right Ventricle: The right ventricular size is normal.Right ventricular systolic function is normal. Left Atrium: Left atrial size was normal in size. Right Atrium: Right atrial size was normal in size. Pericardium: There is no evidence of pericardial effusion. Mitral Valve: The mitral valve is normal in structure. No evidence of mitral valve regurgitation. No evidence of mitral valve stenosis. Tricuspid Valve: The tricuspid valve is normal in structure. Tricuspid valve regurgitation is trivial. No evidence of tricuspid stenosis. Aortic Valve: The aortic valve was not well visualized. Aortic valve regurgitation is not visualized. No aortic stenosis is present. Pulmonic Valve: The pulmonic valve was not well visualized. Pulmonic valve regurgitation Not assessed. Aorta: The aortic root is normal in size and structure. Venous: The inferior vena cava was not well visualized.   LEFT VENTRICLE PLAX 2D LVIDd:         3.16 cm  Diastology LVIDs:         2.48 cm  LV e' medial:    5.22 cm/s LV PW:         1.25 cm  LV E/e' medial:  14.6 LV IVS:        1.37 cm  LV e' lateral:   8.16 cm/s LVOT diam:     2.10 cm  LV E/e' lateral: 9.3 LV SV:         97 LV SV Index:   40 LVOT Area:     3.46 cm  RIGHT VENTRICLE RV Basal diam:  2.98 cm RV S prime:     5.33 cm/s TAPSE (M-mode): 2.7 cm LEFT ATRIUM             Index       RIGHT ATRIUM           Index LA diam:        3.00 cm 1.24 cm/m  RA Area:     11.00 cm LA Vol (A2C):   37.4 ml 15.40 ml/m RA Volume:   21.10 ml  8.69 ml/m LA Vol (A4C):   43.3 ml 17.83 ml/m LA Biplane Vol: 43.2 ml 17.79 ml/m  AORTIC VALVE LVOT Vmax:   145.00 cm/s LVOT Vmean:  106.000 cm/s LVOT VTI:    0.280 m  AORTA Ao Root diam: 2.90 cm MITRAL VALVE MV Area (PHT): 3.12 cm    SHUNTS MV Decel Time: 243 msec    Systemic VTI:  0.28 m MV E velocity: 76.20 cm/s  Systemic Diam: 2.10 cm  MV A velocity: 97.50 cm/s MV E/A ratio:  0.78 Kirk Ruths MD Electronically signed by Kirk Ruths MD Signature Date/Time: 05/20/2020/4:52:17 PM    Final      Time coordinating discharge: Over 30 minutes    Dwyane Dee, MD  [facility] 05/21/2020, 4:24 PM

## 2020-05-22 ENCOUNTER — Other Ambulatory Visit (HOSPITAL_COMMUNITY): Payer: Self-pay

## 2020-05-23 ENCOUNTER — Other Ambulatory Visit (HOSPITAL_COMMUNITY): Payer: Self-pay

## 2020-05-25 ENCOUNTER — Other Ambulatory Visit (HOSPITAL_COMMUNITY): Payer: Self-pay

## 2020-05-25 MED ORDER — TRAMADOL HCL 50 MG PO TABS
ORAL_TABLET | ORAL | 0 refills | Status: DC
  Filled 2020-05-25: qty 90, 30d supply, fill #0

## 2020-05-26 ENCOUNTER — Other Ambulatory Visit (HOSPITAL_COMMUNITY): Payer: Self-pay

## 2020-05-27 ENCOUNTER — Other Ambulatory Visit (HOSPITAL_COMMUNITY): Payer: Self-pay

## 2020-05-28 ENCOUNTER — Other Ambulatory Visit (HOSPITAL_COMMUNITY): Payer: Self-pay

## 2020-05-29 MED FILL — Perflutren Lipid Microsphere IV Susp 1.1 MG/ML: INTRAVENOUS | Qty: 10 | Status: AC

## 2020-06-03 ENCOUNTER — Other Ambulatory Visit (HOSPITAL_COMMUNITY): Payer: Self-pay

## 2020-06-03 DIAGNOSIS — I10 Essential (primary) hypertension: Secondary | ICD-10-CM | POA: Diagnosis not present

## 2020-06-03 DIAGNOSIS — B354 Tinea corporis: Secondary | ICD-10-CM | POA: Diagnosis not present

## 2020-06-03 DIAGNOSIS — R0602 Shortness of breath: Secondary | ICD-10-CM | POA: Diagnosis not present

## 2020-06-03 DIAGNOSIS — R21 Rash and other nonspecific skin eruption: Secondary | ICD-10-CM | POA: Diagnosis not present

## 2020-06-03 MED ORDER — TRIAMCINOLONE ACETONIDE 0.1 % EX CREA
TOPICAL_CREAM | CUTANEOUS | 0 refills | Status: DC
  Filled 2020-06-03: qty 80, 30d supply, fill #0

## 2020-06-03 MED ORDER — NYSTATIN 100000 UNIT/GM EX POWD
CUTANEOUS | 1 refills | Status: DC
  Filled 2020-06-03: qty 120, 30d supply, fill #0

## 2020-06-04 ENCOUNTER — Other Ambulatory Visit (HOSPITAL_COMMUNITY): Payer: Self-pay

## 2020-06-05 ENCOUNTER — Telehealth: Payer: Self-pay

## 2020-06-05 NOTE — Telephone Encounter (Signed)
NOTES ON [facility] [place] [facility] [phone], SENT REFERRAL TO SCHEDULING

## 2020-06-09 ENCOUNTER — Other Ambulatory Visit (HOSPITAL_COMMUNITY): Payer: Self-pay

## 2020-06-10 ENCOUNTER — Other Ambulatory Visit (HOSPITAL_COMMUNITY): Payer: Self-pay

## 2020-06-11 ENCOUNTER — Other Ambulatory Visit (HOSPITAL_COMMUNITY): Payer: Self-pay

## 2020-06-11 DIAGNOSIS — M545 Low back pain, unspecified: Secondary | ICD-10-CM | POA: Diagnosis not present

## 2020-06-11 DIAGNOSIS — M1712 Unilateral primary osteoarthritis, left knee: Secondary | ICD-10-CM | POA: Diagnosis not present

## 2020-06-11 DIAGNOSIS — M7061 Trochanteric bursitis, right hip: Secondary | ICD-10-CM | POA: Diagnosis not present

## 2020-06-11 MED ORDER — CELECOXIB 200 MG PO CAPS
ORAL_CAPSULE | ORAL | 3 refills | Status: DC
  Filled 2020-06-11: qty 30, 30d supply, fill #0

## 2020-06-12 ENCOUNTER — Other Ambulatory Visit (HOSPITAL_COMMUNITY): Payer: Self-pay

## 2020-06-15 ENCOUNTER — Other Ambulatory Visit (HOSPITAL_COMMUNITY): Payer: Self-pay

## 2020-06-15 MED ORDER — ALBUTEROL SULFATE HFA 108 (90 BASE) MCG/ACT IN AERS
INHALATION_SPRAY | RESPIRATORY_TRACT | 0 refills | Status: DC
  Filled 2020-06-15: qty 18, 25d supply, fill #0

## 2020-06-18 ENCOUNTER — Other Ambulatory Visit (HOSPITAL_COMMUNITY): Payer: Self-pay

## 2020-06-18 MED ORDER — CLONIDINE HCL 0.2 MG PO TABS
0.2000 mg | ORAL_TABLET | Freq: Two times a day (BID) | ORAL | 1 refills | Status: DC
  Filled 2020-06-18: qty 180, 90d supply, fill #0
  Filled 2020-09-11: qty 180, 90d supply, fill #1

## 2020-06-19 ENCOUNTER — Other Ambulatory Visit (HOSPITAL_COMMUNITY): Payer: Self-pay

## 2020-07-03 ENCOUNTER — Other Ambulatory Visit (HOSPITAL_COMMUNITY): Payer: Self-pay

## 2020-07-06 ENCOUNTER — Other Ambulatory Visit (HOSPITAL_COMMUNITY): Payer: Self-pay

## 2020-07-06 MED ORDER — TRAMADOL HCL 50 MG PO TABS
50.0000 mg | ORAL_TABLET | Freq: Three times a day (TID) | ORAL | 0 refills | Status: DC | PRN
  Filled 2020-07-06: qty 90, 30d supply, fill #0

## 2020-07-07 ENCOUNTER — Other Ambulatory Visit (HOSPITAL_COMMUNITY): Payer: Self-pay

## 2020-07-10 ENCOUNTER — Other Ambulatory Visit (HOSPITAL_COMMUNITY): Payer: Self-pay

## 2020-07-10 MED FILL — Pravastatin Sodium Tab 20 MG: ORAL | 90 days supply | Qty: 90 | Fill #0 | Status: AC

## 2020-07-14 ENCOUNTER — Other Ambulatory Visit (HOSPITAL_COMMUNITY): Payer: Self-pay

## 2020-07-15 ENCOUNTER — Other Ambulatory Visit (HOSPITAL_COMMUNITY): Payer: Self-pay

## 2020-07-15 MED ORDER — PANTOPRAZOLE SODIUM 40 MG PO TBEC
40.0000 mg | DELAYED_RELEASE_TABLET | Freq: Every day | ORAL | 0 refills | Status: DC
  Filled 2020-07-15: qty 90, 90d supply, fill #0

## 2020-07-16 ENCOUNTER — Other Ambulatory Visit (HOSPITAL_COMMUNITY): Payer: Self-pay

## 2020-08-04 ENCOUNTER — Other Ambulatory Visit (HOSPITAL_COMMUNITY): Payer: Self-pay

## 2020-08-13 ENCOUNTER — Other Ambulatory Visit (HOSPITAL_COMMUNITY): Payer: Self-pay

## 2020-08-13 MED ORDER — TRAMADOL HCL 50 MG PO TABS
50.0000 mg | ORAL_TABLET | Freq: Three times a day (TID) | ORAL | 0 refills | Status: DC | PRN
  Filled 2020-08-13: qty 90, 30d supply, fill #0

## 2020-08-14 ENCOUNTER — Other Ambulatory Visit (HOSPITAL_COMMUNITY): Payer: Self-pay

## 2020-08-21 ENCOUNTER — Other Ambulatory Visit (HOSPITAL_COMMUNITY): Payer: Self-pay

## 2020-08-21 MED ORDER — LISINOPRIL 40 MG PO TABS
40.0000 mg | ORAL_TABLET | Freq: Two times a day (BID) | ORAL | 1 refills | Status: DC
  Filled 2020-08-21: qty 180, 90d supply, fill #0
  Filled 2020-11-20: qty 180, 90d supply, fill #1

## 2020-08-21 MED FILL — Hydroxyzine HCl Tab 25 MG: ORAL | 30 days supply | Qty: 30 | Fill #0 | Status: AC

## 2020-09-01 DIAGNOSIS — R7303 Prediabetes: Secondary | ICD-10-CM | POA: Diagnosis not present

## 2020-09-01 DIAGNOSIS — E041 Nontoxic single thyroid nodule: Secondary | ICD-10-CM | POA: Diagnosis not present

## 2020-09-01 DIAGNOSIS — Z6841 Body Mass Index (BMI) 40.0 and over, adult: Secondary | ICD-10-CM | POA: Diagnosis not present

## 2020-09-01 DIAGNOSIS — G479 Sleep disorder, unspecified: Secondary | ICD-10-CM | POA: Diagnosis not present

## 2020-09-01 DIAGNOSIS — Z0000 Encounter for general adult medical examination without abnormal findings: Secondary | ICD-10-CM | POA: Diagnosis not present

## 2020-09-02 ENCOUNTER — Other Ambulatory Visit (HOSPITAL_COMMUNITY): Payer: Self-pay

## 2020-09-02 MED ORDER — SAXENDA 18 MG/3ML ~~LOC~~ SOPN
PEN_INJECTOR | SUBCUTANEOUS | 1 refills | Status: DC
  Filled 2020-09-02: qty 15, 44d supply, fill #0

## 2020-09-04 ENCOUNTER — Other Ambulatory Visit (HOSPITAL_COMMUNITY): Payer: Self-pay

## 2020-09-04 MED ORDER — ZOLPIDEM TARTRATE 5 MG PO TABS
ORAL_TABLET | ORAL | 0 refills | Status: DC
  Filled 2020-09-04: qty 30, 30d supply, fill #0

## 2020-09-11 ENCOUNTER — Other Ambulatory Visit (HOSPITAL_COMMUNITY): Payer: Self-pay

## 2020-09-15 ENCOUNTER — Other Ambulatory Visit (HOSPITAL_COMMUNITY): Payer: Self-pay

## 2020-09-16 ENCOUNTER — Other Ambulatory Visit (HOSPITAL_COMMUNITY): Payer: Self-pay

## 2020-09-16 MED ORDER — TRAMADOL HCL 50 MG PO TABS
50.0000 mg | ORAL_TABLET | Freq: Three times a day (TID) | ORAL | 0 refills | Status: DC | PRN
  Filled 2020-09-16: qty 90, 30d supply, fill #0

## 2020-09-17 ENCOUNTER — Other Ambulatory Visit (HOSPITAL_COMMUNITY): Payer: Self-pay

## 2020-09-18 ENCOUNTER — Other Ambulatory Visit (HOSPITAL_COMMUNITY): Payer: Self-pay

## 2020-09-21 ENCOUNTER — Other Ambulatory Visit (HOSPITAL_COMMUNITY): Payer: Self-pay

## 2020-09-25 ENCOUNTER — Other Ambulatory Visit (HOSPITAL_COMMUNITY): Payer: Self-pay

## 2020-09-28 ENCOUNTER — Other Ambulatory Visit (HOSPITAL_COMMUNITY): Payer: Self-pay

## 2020-09-28 MED ORDER — DICLOFENAC SODIUM 1 % EX GEL
CUTANEOUS | 2 refills | Status: DC
  Filled 2020-09-28: qty 500, 41d supply, fill #0
  Filled 2021-03-23: qty 500, 41d supply, fill #1
  Filled 2021-07-28: qty 500, 41d supply, fill #2

## 2020-09-29 ENCOUNTER — Other Ambulatory Visit (HOSPITAL_COMMUNITY): Payer: Self-pay

## 2020-09-30 ENCOUNTER — Other Ambulatory Visit (HOSPITAL_COMMUNITY): Payer: Self-pay

## 2020-10-02 ENCOUNTER — Other Ambulatory Visit (HOSPITAL_COMMUNITY): Payer: Self-pay

## 2020-10-05 ENCOUNTER — Other Ambulatory Visit (HOSPITAL_COMMUNITY): Payer: Self-pay

## 2020-10-07 ENCOUNTER — Other Ambulatory Visit (HOSPITAL_COMMUNITY): Payer: Self-pay

## 2020-10-07 MED ORDER — PANTOPRAZOLE SODIUM 40 MG PO TBEC
40.0000 mg | DELAYED_RELEASE_TABLET | Freq: Every day | ORAL | 0 refills | Status: DC
  Filled 2020-10-07: qty 90, 90d supply, fill #0

## 2020-10-09 ENCOUNTER — Other Ambulatory Visit (HOSPITAL_COMMUNITY): Payer: Self-pay

## 2020-10-12 ENCOUNTER — Other Ambulatory Visit (HOSPITAL_COMMUNITY): Payer: Self-pay

## 2020-10-14 ENCOUNTER — Other Ambulatory Visit (HOSPITAL_COMMUNITY): Payer: Self-pay

## 2020-10-14 MED ORDER — PRAVASTATIN SODIUM 20 MG PO TABS
ORAL_TABLET | ORAL | 2 refills | Status: DC
  Filled 2020-10-14: qty 90, 90d supply, fill #0
  Filled 2021-01-13: qty 90, 90d supply, fill #1
  Filled 2021-04-12: qty 90, 90d supply, fill #2

## 2020-10-15 ENCOUNTER — Other Ambulatory Visit (HOSPITAL_COMMUNITY): Payer: Self-pay

## 2020-10-16 ENCOUNTER — Other Ambulatory Visit (HOSPITAL_COMMUNITY): Payer: Self-pay

## 2020-10-21 ENCOUNTER — Other Ambulatory Visit (HOSPITAL_COMMUNITY): Payer: Self-pay

## 2020-10-21 MED ORDER — TRAMADOL HCL 50 MG PO TABS
50.0000 mg | ORAL_TABLET | Freq: Three times a day (TID) | ORAL | 0 refills | Status: DC | PRN
  Filled 2020-10-21: qty 90, 30d supply, fill #0

## 2020-10-22 ENCOUNTER — Other Ambulatory Visit (HOSPITAL_COMMUNITY): Payer: Self-pay

## 2020-10-27 ENCOUNTER — Other Ambulatory Visit (HOSPITAL_COMMUNITY): Payer: Self-pay

## 2020-10-27 MED ORDER — ZOLPIDEM TARTRATE 5 MG PO TABS
5.0000 mg | ORAL_TABLET | Freq: Every evening | ORAL | 0 refills | Status: DC | PRN
  Filled 2020-10-27: qty 30, 30d supply, fill #0

## 2020-10-28 ENCOUNTER — Other Ambulatory Visit (HOSPITAL_COMMUNITY): Payer: Self-pay

## 2020-10-28 MED ORDER — AMLODIPINE BESYLATE 10 MG PO TABS
10.0000 mg | ORAL_TABLET | Freq: Every day | ORAL | 1 refills | Status: DC
  Filled 2020-10-28: qty 90, 90d supply, fill #0
  Filled 2021-02-01: qty 90, 90d supply, fill #1

## 2020-11-03 ENCOUNTER — Other Ambulatory Visit (HOSPITAL_COMMUNITY): Payer: Self-pay

## 2020-11-03 MED ORDER — PHENTERMINE HCL 37.5 MG PO TABS
37.5000 mg | ORAL_TABLET | Freq: Every day | ORAL | 0 refills | Status: DC
  Filled 2020-11-03: qty 30, 30d supply, fill #0

## 2020-11-09 ENCOUNTER — Emergency Department (HOSPITAL_BASED_OUTPATIENT_CLINIC_OR_DEPARTMENT_OTHER): Payer: 59

## 2020-11-09 ENCOUNTER — Emergency Department (HOSPITAL_BASED_OUTPATIENT_CLINIC_OR_DEPARTMENT_OTHER)
Admission: EM | Admit: 2020-11-09 | Discharge: 2020-11-09 | Disposition: A | Discharge status: Home / Self Care | Payer: 59 | Attending: Emergency Medicine | Admitting: Emergency Medicine

## 2020-11-09 ENCOUNTER — Other Ambulatory Visit: Payer: Self-pay

## 2020-11-09 ENCOUNTER — Other Ambulatory Visit (HOSPITAL_COMMUNITY): Payer: Self-pay

## 2020-11-09 ENCOUNTER — Encounter (HOSPITAL_BASED_OUTPATIENT_CLINIC_OR_DEPARTMENT_OTHER): Payer: Self-pay | Admitting: Emergency Medicine

## 2020-11-09 ENCOUNTER — Other Ambulatory Visit (HOSPITAL_BASED_OUTPATIENT_CLINIC_OR_DEPARTMENT_OTHER): Payer: Self-pay

## 2020-11-09 DIAGNOSIS — R6883 Chills (without fever): Secondary | ICD-10-CM | POA: Diagnosis not present

## 2020-11-09 DIAGNOSIS — E669 Obesity, unspecified: Secondary | ICD-10-CM | POA: Insufficient documentation

## 2020-11-09 DIAGNOSIS — Z20822 Contact with and (suspected) exposure to covid-19: Secondary | ICD-10-CM | POA: Insufficient documentation

## 2020-11-09 DIAGNOSIS — I10 Essential (primary) hypertension: Secondary | ICD-10-CM | POA: Insufficient documentation

## 2020-11-09 DIAGNOSIS — Z7951 Long term (current) use of inhaled steroids: Secondary | ICD-10-CM | POA: Diagnosis not present

## 2020-11-09 DIAGNOSIS — Z79899 Other long term (current) drug therapy: Secondary | ICD-10-CM | POA: Insufficient documentation

## 2020-11-09 DIAGNOSIS — R0989 Other specified symptoms and signs involving the circulatory and respiratory systems: Secondary | ICD-10-CM | POA: Insufficient documentation

## 2020-11-09 DIAGNOSIS — R079 Chest pain, unspecified: Secondary | ICD-10-CM | POA: Insufficient documentation

## 2020-11-09 DIAGNOSIS — D72829 Elevated white blood cell count, unspecified: Secondary | ICD-10-CM | POA: Diagnosis not present

## 2020-11-09 DIAGNOSIS — T505X5A Adverse effect of appetite depressants, initial encounter: Secondary | ICD-10-CM | POA: Diagnosis not present

## 2020-11-09 DIAGNOSIS — E876 Hypokalemia: Secondary | ICD-10-CM

## 2020-11-09 DIAGNOSIS — T887XXA Unspecified adverse effect of drug or medicament, initial encounter: Secondary | ICD-10-CM

## 2020-11-09 LAB — BASIC METABOLIC PANEL
Anion gap: 8 (ref 5–15)
BUN: 16 mg/dL (ref 8–23)
CO2: 29 mmol/L (ref 22–32)
Calcium: 10.1 mg/dL (ref 8.9–10.3)
Chloride: 102 mmol/L (ref 98–111)
Creatinine, Ser: 0.64 mg/dL (ref 0.44–1.00)
GFR, Estimated: >60 mL/min (ref >60)
Glucose, Bld: 96 mg/dL (ref 70–99)
Potassium: 3.2 mmol/L — ABNORMAL LOW (ref 3.5–5.1)
Sodium: 139 mmol/L (ref 135–145)

## 2020-11-09 LAB — CBC
HCT: 43.6 % (ref 36.0–46.0)
Hemoglobin: 14.3 g/dL (ref 12.0–15.0)
MCH: 25.1 pg — ABNORMAL LOW (ref 26.0–34.0)
MCHC: 32.8 g/dL (ref 30.0–36.0)
MCV: 76.5 fL — ABNORMAL LOW (ref 80.0–100.0)
Platelets: 298 10*3/uL (ref 150–400)
RBC: 5.7 MIL/uL — ABNORMAL HIGH (ref 3.87–5.11)
RDW: 19 % — ABNORMAL HIGH (ref 11.5–15.5)
WBC: 6.9 10*3/uL (ref 4.0–10.5)
nRBC: 0 % (ref 0.0–0.2)

## 2020-11-09 LAB — TROPONIN I (HIGH SENSITIVITY): Troponin I (High Sensitivity): 7 ng/L (ref <18)

## 2020-11-09 LAB — RESP PANEL BY RT-PCR (FLU A&B, COVID) ARPGX2
Influenza A by PCR: NEGATIVE
Influenza B by PCR: NEGATIVE
SARS Coronavirus 2 by RT PCR: NEGATIVE

## 2020-11-09 MED ORDER — LISINOPRIL 10 MG PO TABS
40.0000 mg | ORAL_TABLET | Freq: Once | ORAL | Status: AC
  Administered 2020-11-09: 40 mg via ORAL
  Filled 2020-11-09: qty 4

## 2020-11-09 MED ORDER — AMLODIPINE BESYLATE 5 MG PO TABS
10.0000 mg | ORAL_TABLET | Freq: Once | ORAL | Status: AC
  Administered 2020-11-09: 10 mg via ORAL
  Filled 2020-11-09: qty 2

## 2020-11-09 MED ORDER — HYDROCHLOROTHIAZIDE 25 MG PO TABS
25.0000 mg | ORAL_TABLET | Freq: Once | ORAL | Status: AC
  Administered 2020-11-09: 25 mg via ORAL
  Filled 2020-11-09: qty 1

## 2020-11-09 MED ORDER — CLONIDINE HCL 0.1 MG PO TABS
0.2000 mg | ORAL_TABLET | Freq: Once | ORAL | Status: AC
  Administered 2020-11-09: 0.2 mg via ORAL
  Filled 2020-11-09 (×2): qty 2

## 2020-11-09 MED ORDER — POTASSIUM CHLORIDE ER 10 MEQ PO TBCR
10.0000 meq | EXTENDED_RELEASE_TABLET | Freq: Every day | ORAL | 0 refills | Status: DC
  Filled 2020-11-09 (×2): qty 5, 5d supply, fill #0

## 2020-11-09 NOTE — ED Notes (Signed)
Assisted up to BR, pt ambulatory by self

## 2020-11-09 NOTE — ED Provider Notes (Signed)
[facility] Provider Note   CSN: [id number] Arrival date & time: 11/09/20  1316     History Chief Complaint  Patient presents with   Hypertension   Chest Pain    Ashley Warren is a 65 y.o. female with PMHx HTN and morbid obesity who presents to the ED today with complaint of high blood pressure readings that she noticed Friday.  Patient reports this week she began taking phentermine for weight loss.  She states that after taking the first dose she began having her palpitations and feeling lightheaded.  She states that she took an additional dose and continue to have worsening symptoms.  She then decided to check her blood pressure and reports it has been progressively increasing throughout the weekend.  She states that she stopped taking the phentermine after 1 day however continues to have symptoms.  She also complains of chest congestion and chills.  She denies any fevers however admits that she has not been taking her temperature.  She denies any recent sick contacts.  Denies any specific chest pain.  No shortness of breath.  No abdominal pain, nausea, vomiting, diarrhea.  States she has been compliant with her antihypertensives.  She typically works third shift and therefore takes her morning doses around 3 PM.  She has not taken her morning dose today given it is not time yet.   The history is provided by the patient and medical records.      Past Medical History:  Diagnosis Date   Hypertension     Patient Active Problem List   Diagnosis Date Noted   HTN (hypertension) 05/21/2020   Low back pain 04/29/2015   Lumbar facet arthropathy 04/29/2015   Trochanteric bursitis of both hips 04/29/2015   Morbid obesity ([place]) 04/29/2015    Past Surgical History:  Procedure Laterality Date   ABDOMINAL HYSTERECTOMY     APPENDECTOMY     COSMETIC SURGERY     mass removed from shoulder/scapula area     OB History   No obstetric history on file.      Family History  Problem Relation Age of Onset   Alcohol abuse Maternal Grandmother    Autism spectrum disorder Sister    Breast cancer Mother    Depression Sister    Hypertension Mother    Hypertension Sister    Osteoarthritis Maternal Grandmother     Social History   Tobacco Use   Smoking status: Never   Smokeless tobacco: Never  Substance Use Topics   Alcohol use: Yes    Comment: ocially   Drug use: No    Home Medications Prior to Admission medications   Medication Sig Start Date End Date Taking? Authorizing Provider  albuterol (PROVENTIL HFA;VENTOLIN HFA) 108 (90 BASE) MCG/ACT inhaler Inhale 2 puffs into the lungs every 4 (four) hours as needed for wheezing or shortness of breath. 09/30/13   Waldemar Dickens, MD  albuterol (VENTOLIN HFA) 108 (90 Base) MCG/ACT inhaler Inhale 2 puffs into the lungs every 6 (six) hours as needed. 06/15/20     amLODipine (NORVASC) 10 MG tablet TAKE 1 TABLET BY MOUTH ONCE DAILY 10/28/20     Ascorbic Acid (VITAMIN C CR) 500 MG CPCR Take 500 mg by mouth daily.    [provider]  B Complex Vitamins (B COMPLEX PO) Take 1 tablet by mouth daily.    [provider]  BACILLUS COAGULANS-INULIN PO Take 2 tablets by mouth daily.    [provider]  Black Cohosh 160 MG CAPS Take 160 mg by mouth daily.    [provider]  Boswellia Serrata (BOSWELLIA PO) Take 375 mg by mouth daily.    [provider]  budesonide-formoterol (SYMBICORT) 160-4.5 MCG/ACT inhaler Inhale 2 puffs into the lungs 2 (two) times daily.    [provider]  calcium carbonate (OS-CAL) 1250 (500 Ca) MG chewable tablet Chew 1,250 mg by mouth daily.    [provider]  celecoxib (CELEBREX) 200 MG capsule Take 1 capsule by mouth daily 06/11/20     cloNIDine (CATAPRES) 0.2 MG tablet TAKE 1 TABLET BY MOUTH 2 TIMES DAILY Patient taking differently: Take 0.2 mg by mouth 2 (two) times daily. 06/18/19 06/17/20  Berkley Harvey, NP  cloNIDine  (CATAPRES) 0.2 MG tablet TAKE 1 TABLET BY MOUTH 2 TIMES DAILY 06/18/20     diclofenac Sodium (VOLTAREN) 1 % GEL APPLY 4 GRAMS 3 TIMES DAILY TO AFFECTED AREA(S) 09/28/20     ergocalciferol (VITAMIN D2) 1.25 MG (50000 UT) capsule Take 50,000 Units by mouth 2 (two) times a week. No specific days    [provider]  hydrochlorothiazide (HYDRODIURIL) 25 MG tablet TAKE 1 TABLET BY MOUTH ONCE DAILY Patient taking differently: Take 25 mg by mouth daily. 05/11/20     hydrOXYzine (ATARAX/VISTARIL) 25 MG tablet TAKE 1 TABLET BY MOUTH DAILY AS NEEDED FOR ANXIETY/SLEEP Patient taking differently: Take 25 mg by mouth at bedtime as needed (sleep). 03/09/20 03/09/21  Berkley Harvey, NP  ibuprofen (ADVIL) 200 MG tablet Take 4 tablets by mouth 2  times daily as needed. 05/21/20   Dwyane Dee, MD  lisinopril (ZESTRIL) 40 MG tablet Take 1 tablet (40 mg total) by mouth daily. 05/21/20   Dwyane Dee, MD  lisinopril (ZESTRIL) 40 MG tablet TAKE 1 TABLET BY MOUTH TWICE DAILY 08/21/20     Milk Thistle 150 MG CAPS Take 150 mg by mouth daily.    [provider]  mupirocin nasal ointment (BACTROBAN) 2 % Place 1 application into the nose 2 (two) times daily as needed (nasal itching). Use one-half of tube in each nostril twice daily for five (5) days. After application, press sides of nose together and gently massage.    [provider]  nystatin (MYCOSTATIN/NYSTOP) powder USE TWICE A DAY FOR 10 DAYS THEN AS NEEDED 06/03/20     OVER THE COUNTER MEDICATION Take 2 capsules by mouth daily. FISH OIL OMEGA 3 VIT C VIT E 2000-650-12MG     [provider]  pantoprazole (PROTONIX) 40 MG tablet TAKE 1 TABLET BY MOUTH ONCE A DAY 10/07/20     phentermine (ADIPEX-P) 37.5 MG tablet Take 1 tablet by mouth daily before breakfast. 11/03/20     polyvinyl alcohol (LIQUIFILM TEARS) 1.4 % ophthalmic solution Place 1 drop into both eyes daily.    [provider]  potassium chloride SA (KLOR-CON) 20 MEQ tablet  TAKE 2 TABLETS BY MOUTH DAILY 05/11/20     pravastatin (PRAVACHOL) 20 MG tablet TAKE 1 TABLET BY MOUTH EVERY EVENING Patient taking differently: Take 20 mg by mouth every evening. 01/16/20 01/15/21  Berkley Harvey, NP  pravastatin (PRAVACHOL) 20 MG tablet Take 1 tablet (20 mg total) by mouth nightly. 10/14/20     Spacer/Aero-Holding Chambers DEVI Use as directed 09/30/13   Waldemar Dickens, MD  traMADol (ULTRAM) 50 MG tablet Take 1 tablet by mouth every 8 hours as needed for pain. 10/21/20     triamcinolone cream (KENALOG) 0.1 % Apply to the  affected area(s) as needed 06/03/20     vitamin C (ASCORBIC ACID) 250 MG tablet Take 250 mg by mouth daily.    [provider]  zolpidem (AMBIEN) 5 MG tablet Take 1 tablet by mouth nightly as needed for sleep. 10/27/20       Allergies    Penicillins  Review of Systems   Review of Systems  Constitutional:  Positive for chills. Negative for fever.  HENT:  Negative for sore throat.   Eyes:  Negative for visual disturbance.  Respiratory:  Negative for cough and shortness of breath.   Cardiovascular:        + chest "congestion"  Gastrointestinal:  Negative for abdominal pain, diarrhea, nausea and vomiting.  Musculoskeletal:  Negative for myalgias.  Neurological:  Positive for light-headedness and headaches.  All other systems reviewed and are negative.  Physical Exam Updated Vital Signs BP (!) 148/85 (BP Location: Left Arm)   Pulse 70   Temp 98.7 F (37.1 C) (Oral)   Resp 18   Ht 5\' 2"  (1.575 m)   Wt (!) 152.4 kg   SpO2 98%   BMI 61.46 kg/m   Physical Exam Vitals and nursing note reviewed.  Constitutional:      Appearance: She is obese. She is not ill-appearing or diaphoretic.  HENT:     Head: Normocephalic and atraumatic.  Eyes:     Conjunctiva/sclera: Conjunctivae normal.  Cardiovascular:     Rate and Rhythm: Normal rate and regular rhythm.     Pulses:          Radial pulses are 2+ on the right side and 2+ on the left side.      Heart sounds: Normal heart sounds.  Pulmonary:     Effort: Pulmonary effort is normal.     Breath sounds: Normal breath sounds. No decreased breath sounds, wheezing, rhonchi or rales.  Abdominal:     Palpations: Abdomen is soft.     Tenderness: There is no abdominal tenderness.  Musculoskeletal:     Cervical back: Neck supple.     Right lower leg: No edema.     Left lower leg: No edema.  Skin:    General: Skin is warm and dry.  Neurological:     Mental Status: She is alert.    ED Results / Procedures / Treatments   Labs (all labs ordered are listed, but only abnormal results are displayed) Labs Reviewed  BASIC METABOLIC PANEL - Abnormal; Notable for the following components:      Result Value   Potassium 3.2 (*)    All other components within normal limits  CBC - Abnormal; Notable for the following components:   RBC 5.70 (*)    MCV 76.5 (*)    MCH 25.1 (*)    RDW 19.0 (*)    All other components within normal limits  RESP PANEL BY RT-PCR (FLU A&B, COVID) [id number]  TROPONIN I (HIGH SENSITIVITY)    EKG None  Radiology DG Chest 2 View  Result Date: 11/09/2020 CLINICAL DATA:  Chest pain and hypertension. EXAM: CHEST - 2 VIEW COMPARISON:  05/20/2020. FINDINGS: Trachea is midline. Heart size normal. Lungs are clear. No pleural fluid. IMPRESSION: No acute findings. Electronically Signed   By: Lorin Picket M.D.   On: 11/09/2020 14:34    Procedures Procedures   Medications Ordered in ED Medications  lisinopril (ZESTRIL) tablet 40 mg (40 mg Oral Given 11/09/20 1501)  amLODipine (NORVASC) tablet 10 mg (10 mg Oral Given 11/09/20 1502)  hydrochlorothiazide (HYDRODIURIL) tablet 25 mg (25 mg Oral Given 11/09/20 1502)  cloNIDine (CATAPRES) tablet 0.2 mg (0.2 mg Oral Given 11/09/20 1503)    ED Course  I have reviewed the triage vital signs and the nursing notes.  Pertinent labs & imaging results that were available during my care of the patient were reviewed by me and  considered in my medical decision making (see chart for details).    MDM Rules/Calculators/A&P                           65 year old female presents to the ED today with complaint of high blood pressure, headache, chest congestion, chills, lightheadedness secondary to starting phentermine this week for weight loss.  Has been compliant with high blood pressure medications.  On arrival to the ED today patient is noted to be hypertensive with a blood pressure reading of 183/108.  Remainder of vitals are unremarkable.  She had a twelve-lead EKG with incomplete RBBB. Labs collected but pending - CBC, BMP, Troponin. Will await tests at this time however pt denies specific chest pain. Given chest congestion and chills will perform rapid flu testing at this time. May consider send out COVID testing however we do not have a rapid COVID test currently at this facility. Will provide pt's regular doses of BP meds today (typically she takes her morning doses around 3 PM).   CBC without leukocytosis. Hgb stable at 14.3 BMP with potassium 3.2. No other electrolyte abnormalities. Will discharge with KDUR.  Troponin of 7. Do not feel pt requires repeat testing at this time.  COVID and flu negative CXR clear  Blood pressure significantly improved with pt's regular home meds - most recently 148/85. On reeeval she reports significant improvement in symptoms. Reports headache has resolved. She is ready to go home. I think this is reasonable today. Have instructed that she follow up with the provider who prescribed the phentermine and to discuss other options. She is recommended to not take the phentermine given side effects. She is in agreement with plan and stable for discharge home.   This note was prepared using Dragon voice recognition software and may include unintentional dictation errors due to the inherent limitations of voice recognition software.   Final Clinical Impression(s) / ED Diagnoses Final  diagnoses:  Non-dose-related adverse effect of medication, initial encounter  Primary hypertension    Rx / DC Orders ED Discharge Orders     None        Discharge Instructions      Please follow up with your PCP or the provider who prescribed the Phentermine to you. I would suggest no longer taking this medication and to discuss other options for weight loss.   Continue taking your blood pressure medications as prescribed. Drink plenty of fluids to stay hydrated.   Return to the ED for any new/worsening symptoms       Eustaquio Maize, PA-C 11/09/20 [place], [place], DO 11/11/20 2257

## 2020-11-09 NOTE — ED Triage Notes (Addendum)
Pt states she started on weight loss medication on Thursday or Friday.  Pt has been having HTN and can hear her heart beat.  Some sob with exertion.  Pt states her left side of chest has been pressure with burping.

## 2020-11-09 NOTE — Discharge Instructions (Addendum)
Please follow up with your PCP or the provider who prescribed the Phentermine to you. I would suggest no longer taking this medication and to discuss other options for weight loss.   Continue taking your blood pressure medications as prescribed. Drink plenty of fluids to stay hydrated.   Your potassium was slightly decreased today. Please take the potassium supplements as indicated and have your potassium level rechecked in 1-2 weeks.  Return to the ED for any new/worsening symptoms

## 2020-11-10 ENCOUNTER — Other Ambulatory Visit (HOSPITAL_COMMUNITY): Payer: Self-pay

## 2020-11-11 ENCOUNTER — Other Ambulatory Visit (HOSPITAL_COMMUNITY): Payer: Self-pay

## 2020-11-11 MED ORDER — HYDROCHLOROTHIAZIDE 25 MG PO TABS
25.0000 mg | ORAL_TABLET | Freq: Every day | ORAL | 1 refills | Status: DC
  Filled 2020-11-11: qty 90, 90d supply, fill #0
  Filled 2021-02-08: qty 90, 90d supply, fill #1

## 2020-11-12 ENCOUNTER — Other Ambulatory Visit (HOSPITAL_COMMUNITY): Payer: Self-pay

## 2020-11-20 ENCOUNTER — Other Ambulatory Visit (HOSPITAL_COMMUNITY): Payer: Self-pay

## 2020-11-30 ENCOUNTER — Other Ambulatory Visit (HOSPITAL_COMMUNITY): Payer: Self-pay

## 2020-11-30 MED ORDER — TRAMADOL HCL 50 MG PO TABS
50.0000 mg | ORAL_TABLET | Freq: Three times a day (TID) | ORAL | 0 refills | Status: DC | PRN
  Filled 2020-11-30: qty 90, 30d supply, fill #0

## 2020-12-10 ENCOUNTER — Other Ambulatory Visit (HOSPITAL_COMMUNITY): Payer: Self-pay

## 2020-12-14 ENCOUNTER — Other Ambulatory Visit (HOSPITAL_COMMUNITY): Payer: Self-pay

## 2020-12-14 MED ORDER — CLONIDINE HCL 0.2 MG PO TABS
0.2000 mg | ORAL_TABLET | Freq: Two times a day (BID) | ORAL | 1 refills | Status: DC
  Filled 2020-12-14: qty 180, 90d supply, fill #0
  Filled 2021-03-12: qty 180, 90d supply, fill #1

## 2020-12-31 ENCOUNTER — Other Ambulatory Visit (HOSPITAL_COMMUNITY): Payer: Self-pay

## 2020-12-31 MED ORDER — TRAMADOL HCL 50 MG PO TABS
50.0000 mg | ORAL_TABLET | Freq: Three times a day (TID) | ORAL | 0 refills | Status: DC | PRN
  Filled 2020-12-31: qty 90, 30d supply, fill #0

## 2020-12-31 MED ORDER — ZOLPIDEM TARTRATE 5 MG PO TABS
5.0000 mg | ORAL_TABLET | Freq: Every evening | ORAL | 0 refills | Status: DC | PRN
  Filled 2020-12-31: qty 30, 30d supply, fill #0

## 2021-01-01 ENCOUNTER — Other Ambulatory Visit (HOSPITAL_COMMUNITY): Payer: Self-pay

## 2021-01-13 ENCOUNTER — Other Ambulatory Visit (HOSPITAL_COMMUNITY): Payer: Self-pay

## 2021-01-13 ENCOUNTER — Other Ambulatory Visit (HOSPITAL_COMMUNITY): Payer: Self-pay | Admitting: Medical

## 2021-01-13 MED ORDER — PANTOPRAZOLE SODIUM 40 MG PO TBEC
40.0000 mg | DELAYED_RELEASE_TABLET | Freq: Every day | ORAL | 0 refills | Status: DC
  Filled 2021-01-13: qty 90, 90d supply, fill #0

## 2021-01-16 ENCOUNTER — Other Ambulatory Visit (HOSPITAL_COMMUNITY): Payer: Self-pay

## 2021-01-20 ENCOUNTER — Other Ambulatory Visit (HOSPITAL_COMMUNITY): Payer: Self-pay

## 2021-01-20 MED ORDER — POTASSIUM CHLORIDE ER 10 MEQ PO TBCR
EXTENDED_RELEASE_TABLET | ORAL | 0 refills | Status: DC
  Filled 2021-01-20: qty 5, 5d supply, fill #0

## 2021-01-25 ENCOUNTER — Other Ambulatory Visit (HOSPITAL_COMMUNITY): Payer: Self-pay

## 2021-01-26 ENCOUNTER — Other Ambulatory Visit (HOSPITAL_COMMUNITY): Payer: Self-pay

## 2021-01-29 ENCOUNTER — Other Ambulatory Visit (HOSPITAL_COMMUNITY): Payer: Self-pay

## 2021-02-01 ENCOUNTER — Other Ambulatory Visit (HOSPITAL_COMMUNITY): Payer: Self-pay

## 2021-02-01 MED ORDER — TRAMADOL HCL 50 MG PO TABS
50.0000 mg | ORAL_TABLET | Freq: Three times a day (TID) | ORAL | 0 refills | Status: DC | PRN
  Filled 2021-02-01: qty 90, 30d supply, fill #0

## 2021-02-02 ENCOUNTER — Other Ambulatory Visit (HOSPITAL_COMMUNITY): Payer: Self-pay

## 2021-02-02 MED ORDER — POTASSIUM CHLORIDE CRYS ER 20 MEQ PO TBCR
EXTENDED_RELEASE_TABLET | ORAL | 1 refills | Status: DC
  Filled 2021-02-02: qty 180, 90d supply, fill #0
  Filled 2021-05-03: qty 180, 90d supply, fill #1

## 2021-02-04 ENCOUNTER — Other Ambulatory Visit (HOSPITAL_COMMUNITY): Payer: Self-pay

## 2021-02-08 ENCOUNTER — Other Ambulatory Visit (HOSPITAL_COMMUNITY): Payer: Self-pay

## 2021-02-16 ENCOUNTER — Other Ambulatory Visit (HOSPITAL_COMMUNITY): Payer: Self-pay

## 2021-02-19 ENCOUNTER — Other Ambulatory Visit (HOSPITAL_COMMUNITY): Payer: Self-pay

## 2021-02-19 MED ORDER — LISINOPRIL 40 MG PO TABS
40.0000 mg | ORAL_TABLET | Freq: Two times a day (BID) | ORAL | 1 refills | Status: DC
  Filled 2021-02-19: qty 180, 90d supply, fill #0
  Filled 2021-05-24: qty 180, 90d supply, fill #1

## 2021-03-12 ENCOUNTER — Other Ambulatory Visit (HOSPITAL_COMMUNITY): Payer: Self-pay

## 2021-03-23 ENCOUNTER — Other Ambulatory Visit (HOSPITAL_COMMUNITY): Payer: Self-pay

## 2021-03-23 MED ORDER — ZOLPIDEM TARTRATE 5 MG PO TABS
5.0000 mg | ORAL_TABLET | Freq: Every evening | ORAL | 0 refills | Status: DC | PRN
  Filled 2021-03-23: qty 30, 30d supply, fill #0

## 2021-03-24 ENCOUNTER — Other Ambulatory Visit (HOSPITAL_COMMUNITY): Payer: Self-pay

## 2021-03-26 DIAGNOSIS — I10 Essential (primary) hypertension: Secondary | ICD-10-CM | POA: Diagnosis not present

## 2021-03-26 DIAGNOSIS — R7303 Prediabetes: Secondary | ICD-10-CM | POA: Diagnosis not present

## 2021-03-29 ENCOUNTER — Other Ambulatory Visit (HOSPITAL_COMMUNITY): Payer: Self-pay

## 2021-03-29 MED ORDER — OZEMPIC (0.25 OR 0.5 MG/DOSE) 2 MG/3ML ~~LOC~~ SOPN
PEN_INJECTOR | SUBCUTANEOUS | 0 refills | Status: DC
  Filled 2021-03-29: qty 3, 56d supply, fill #0

## 2021-03-31 ENCOUNTER — Other Ambulatory Visit (HOSPITAL_COMMUNITY): Payer: Self-pay

## 2021-04-02 ENCOUNTER — Other Ambulatory Visit (HOSPITAL_COMMUNITY): Payer: Self-pay

## 2021-04-10 ENCOUNTER — Other Ambulatory Visit (HOSPITAL_COMMUNITY): Payer: Self-pay

## 2021-04-12 ENCOUNTER — Other Ambulatory Visit (HOSPITAL_COMMUNITY): Payer: Self-pay

## 2021-04-14 ENCOUNTER — Other Ambulatory Visit (HOSPITAL_COMMUNITY): Payer: Self-pay

## 2021-04-14 MED ORDER — TRAMADOL HCL 50 MG PO TABS
50.0000 mg | ORAL_TABLET | Freq: Three times a day (TID) | ORAL | 0 refills | Status: DC | PRN
  Filled 2021-04-14: qty 90, 30d supply, fill #0

## 2021-04-14 MED ORDER — PANTOPRAZOLE SODIUM 40 MG PO TBEC
40.0000 mg | DELAYED_RELEASE_TABLET | Freq: Every day | ORAL | 0 refills | Status: DC
  Filled 2021-04-14: qty 90, 90d supply, fill #0

## 2021-04-17 ENCOUNTER — Other Ambulatory Visit (HOSPITAL_COMMUNITY): Payer: Self-pay

## 2021-05-03 ENCOUNTER — Other Ambulatory Visit (HOSPITAL_COMMUNITY): Payer: Self-pay

## 2021-05-03 MED ORDER — AMLODIPINE BESYLATE 10 MG PO TABS
10.0000 mg | ORAL_TABLET | Freq: Every day | ORAL | 1 refills | Status: DC
  Filled 2021-05-03: qty 90, 90d supply, fill #0
  Filled 2021-07-28: qty 90, 90d supply, fill #1

## 2021-05-13 ENCOUNTER — Other Ambulatory Visit (HOSPITAL_COMMUNITY): Payer: Self-pay

## 2021-05-13 MED ORDER — HYDROCHLOROTHIAZIDE 25 MG PO TABS
25.0000 mg | ORAL_TABLET | Freq: Every day | ORAL | 1 refills | Status: DC
  Filled 2021-05-13: qty 90, 90d supply, fill #0
  Filled 2021-08-09: qty 90, 90d supply, fill #1

## 2021-05-18 ENCOUNTER — Other Ambulatory Visit (HOSPITAL_COMMUNITY): Payer: Self-pay

## 2021-05-18 MED ORDER — TRAMADOL HCL 50 MG PO TABS
50.0000 mg | ORAL_TABLET | Freq: Three times a day (TID) | ORAL | 0 refills | Status: DC | PRN
  Filled 2021-05-18: qty 90, 30d supply, fill #0

## 2021-05-24 ENCOUNTER — Other Ambulatory Visit (HOSPITAL_COMMUNITY): Payer: Self-pay

## 2021-05-27 ENCOUNTER — Other Ambulatory Visit (HOSPITAL_COMMUNITY): Payer: Self-pay

## 2021-05-28 ENCOUNTER — Other Ambulatory Visit (HOSPITAL_COMMUNITY): Payer: Self-pay

## 2021-05-28 MED ORDER — ZOLPIDEM TARTRATE 5 MG PO TABS
5.0000 mg | ORAL_TABLET | Freq: Every evening | ORAL | 0 refills | Status: DC | PRN
  Filled 2021-05-28: qty 30, 30d supply, fill #0

## 2021-06-10 ENCOUNTER — Other Ambulatory Visit (HOSPITAL_COMMUNITY): Payer: Self-pay

## 2021-06-10 MED ORDER — CLONIDINE HCL 0.2 MG PO TABS
0.2000 mg | ORAL_TABLET | Freq: Two times a day (BID) | ORAL | 1 refills | Status: DC
  Filled 2021-06-10: qty 77, 38d supply, fill #0
  Filled 2021-06-10: qty 103, 52d supply, fill #0
  Filled 2021-08-03 – 2021-09-14 (×2): qty 180, 90d supply, fill #1

## 2021-06-29 ENCOUNTER — Other Ambulatory Visit (HOSPITAL_COMMUNITY): Payer: Self-pay

## 2021-06-29 DIAGNOSIS — R7303 Prediabetes: Secondary | ICD-10-CM | POA: Diagnosis not present

## 2021-06-29 DIAGNOSIS — J3089 Other allergic rhinitis: Secondary | ICD-10-CM | POA: Diagnosis not present

## 2021-06-29 DIAGNOSIS — M47816 Spondylosis without myelopathy or radiculopathy, lumbar region: Secondary | ICD-10-CM | POA: Diagnosis not present

## 2021-06-29 DIAGNOSIS — I10 Essential (primary) hypertension: Secondary | ICD-10-CM | POA: Diagnosis not present

## 2021-06-29 MED ORDER — WEGOVY 0.25 MG/0.5ML ~~LOC~~ SOAJ
SUBCUTANEOUS | 0 refills | Status: DC
  Filled 2021-06-29: qty 2, 28d supply, fill #0

## 2021-06-29 MED ORDER — FLUTICASONE PROPIONATE 50 MCG/ACT NA SUSP
NASAL | 0 refills | Status: DC
  Filled 2021-06-29: qty 16, 30d supply, fill #0
  Filled 2021-09-22: qty 16, 30d supply, fill #1
  Filled 2022-02-09: qty 16, 30d supply, fill #2

## 2021-06-29 MED ORDER — GABAPENTIN 100 MG PO CAPS
ORAL_CAPSULE | ORAL | 1 refills | Status: DC
  Filled 2021-06-29: qty 90, 30d supply, fill #0
  Filled 2021-08-30: qty 90, 30d supply, fill #1

## 2021-07-02 ENCOUNTER — Other Ambulatory Visit (HOSPITAL_COMMUNITY): Payer: Self-pay

## 2021-07-12 ENCOUNTER — Other Ambulatory Visit (HOSPITAL_COMMUNITY): Payer: Self-pay

## 2021-07-12 MED ORDER — PANTOPRAZOLE SODIUM 40 MG PO TBEC
40.0000 mg | DELAYED_RELEASE_TABLET | Freq: Every day | ORAL | 0 refills | Status: DC
Start: 2021-07-12 — End: 2021-10-04
  Filled 2021-07-12: qty 90, 90d supply, fill #0

## 2021-07-12 MED ORDER — TRAMADOL HCL 50 MG PO TABS
50.0000 mg | ORAL_TABLET | Freq: Three times a day (TID) | ORAL | 0 refills | Status: DC | PRN
  Filled 2021-07-12: qty 90, 30d supply, fill #0

## 2021-07-12 MED ORDER — PRAVASTATIN SODIUM 20 MG PO TABS
ORAL_TABLET | ORAL | 0 refills | Status: DC
Start: 2021-07-12 — End: 2021-10-04
  Filled 2021-07-12: qty 90, 90d supply, fill #0

## 2021-07-28 ENCOUNTER — Other Ambulatory Visit (HOSPITAL_COMMUNITY): Payer: Self-pay

## 2021-08-03 ENCOUNTER — Other Ambulatory Visit (HOSPITAL_COMMUNITY): Payer: Self-pay

## 2021-08-03 MED ORDER — POTASSIUM CHLORIDE CRYS ER 20 MEQ PO TBCR
EXTENDED_RELEASE_TABLET | ORAL | 1 refills | Status: DC
  Filled 2021-08-03: qty 180, 90d supply, fill #0
  Filled 2021-11-10: qty 180, 90d supply, fill #1

## 2021-08-03 MED ORDER — ZOLPIDEM TARTRATE 5 MG PO TABS
5.0000 mg | ORAL_TABLET | Freq: Every evening | ORAL | 0 refills | Status: DC | PRN
  Filled 2021-08-03: qty 30, 30d supply, fill #0

## 2021-08-04 ENCOUNTER — Other Ambulatory Visit (HOSPITAL_COMMUNITY): Payer: Self-pay

## 2021-08-09 ENCOUNTER — Other Ambulatory Visit (HOSPITAL_COMMUNITY): Payer: Self-pay

## 2021-08-09 MED ORDER — TRAMADOL HCL 50 MG PO TABS
50.0000 mg | ORAL_TABLET | Freq: Three times a day (TID) | ORAL | 0 refills | Status: DC | PRN
Start: 2021-08-09 — End: 2021-09-14
  Filled 2021-08-09: qty 90, 30d supply, fill #0

## 2021-08-13 ENCOUNTER — Other Ambulatory Visit (HOSPITAL_COMMUNITY): Payer: Self-pay

## 2021-08-14 ENCOUNTER — Other Ambulatory Visit (HOSPITAL_COMMUNITY): Payer: Self-pay

## 2021-08-23 ENCOUNTER — Other Ambulatory Visit (HOSPITAL_COMMUNITY): Payer: Self-pay

## 2021-08-24 ENCOUNTER — Other Ambulatory Visit (HOSPITAL_COMMUNITY): Payer: Self-pay

## 2021-08-24 MED ORDER — LISINOPRIL 40 MG PO TABS
40.0000 mg | ORAL_TABLET | Freq: Two times a day (BID) | ORAL | 1 refills | Status: DC
  Filled 2021-08-24: qty 180, 90d supply, fill #0
  Filled 2021-11-22: qty 180, 90d supply, fill #1

## 2021-08-30 ENCOUNTER — Other Ambulatory Visit (HOSPITAL_COMMUNITY): Payer: Self-pay

## 2021-09-14 ENCOUNTER — Other Ambulatory Visit (HOSPITAL_COMMUNITY): Payer: Self-pay

## 2021-09-14 MED ORDER — TRAMADOL HCL 50 MG PO TABS
50.0000 mg | ORAL_TABLET | Freq: Three times a day (TID) | ORAL | 0 refills | Status: DC | PRN
Start: 2021-09-14 — End: 2021-10-27
  Filled 2021-09-14: qty 90, 30d supply, fill #0

## 2021-09-22 ENCOUNTER — Other Ambulatory Visit (HOSPITAL_COMMUNITY): Payer: Self-pay

## 2021-09-23 ENCOUNTER — Other Ambulatory Visit (HOSPITAL_COMMUNITY): Payer: Self-pay

## 2021-09-23 MED ORDER — ZOLPIDEM TARTRATE 5 MG PO TABS
5.0000 mg | ORAL_TABLET | Freq: Every evening | ORAL | 1 refills | Status: DC | PRN
  Filled 2021-09-23: qty 30, 30d supply, fill #0
  Filled 2021-11-12 – 2021-11-17 (×2): qty 30, 30d supply, fill #1

## 2021-10-04 ENCOUNTER — Other Ambulatory Visit (HOSPITAL_COMMUNITY): Payer: Self-pay

## 2021-10-04 MED ORDER — PANTOPRAZOLE SODIUM 40 MG PO TBEC
40.0000 mg | DELAYED_RELEASE_TABLET | Freq: Every day | ORAL | 0 refills | Status: DC
  Filled 2021-10-04: qty 90, 90d supply, fill #0

## 2021-10-04 MED ORDER — PRAVASTATIN SODIUM 20 MG PO TABS
20.0000 mg | ORAL_TABLET | Freq: Every evening | ORAL | 0 refills | Status: DC
  Filled 2021-10-04: qty 90, 90d supply, fill #0

## 2021-10-27 ENCOUNTER — Other Ambulatory Visit (HOSPITAL_COMMUNITY): Payer: Self-pay

## 2021-10-27 MED ORDER — NYSTATIN 100000 UNIT/GM EX POWD
CUTANEOUS | 1 refills | Status: DC
  Filled 2021-10-27: qty 120, 30d supply, fill #0
  Filled 2022-06-02: qty 120, 30d supply, fill #1

## 2021-10-27 MED ORDER — TRAMADOL HCL 50 MG PO TABS
50.0000 mg | ORAL_TABLET | Freq: Three times a day (TID) | ORAL | 0 refills | Status: DC | PRN
  Filled 2021-10-27: qty 90, 30d supply, fill #0

## 2021-10-27 MED ORDER — TRIAMCINOLONE ACETONIDE 0.1 % EX CREA
TOPICAL_CREAM | CUTANEOUS | 0 refills | Status: DC
  Filled 2021-10-27: qty 80, 30d supply, fill #0

## 2021-10-27 MED ORDER — GABAPENTIN 100 MG PO CAPS
ORAL_CAPSULE | ORAL | 1 refills | Status: DC
  Filled 2021-10-27: qty 90, 30d supply, fill #0

## 2021-10-27 MED ORDER — AMLODIPINE BESYLATE 10 MG PO TABS
10.0000 mg | ORAL_TABLET | Freq: Every day | ORAL | 1 refills | Status: DC
  Filled 2021-10-27: qty 90, 90d supply, fill #0
  Filled 2022-01-24 – 2022-01-25 (×2): qty 90, 90d supply, fill #1

## 2021-10-28 ENCOUNTER — Other Ambulatory Visit (HOSPITAL_COMMUNITY): Payer: Self-pay

## 2021-11-01 ENCOUNTER — Other Ambulatory Visit (HOSPITAL_COMMUNITY): Payer: Self-pay

## 2021-11-10 ENCOUNTER — Other Ambulatory Visit (HOSPITAL_COMMUNITY): Payer: Self-pay

## 2021-11-10 MED ORDER — HYDROCHLOROTHIAZIDE 25 MG PO TABS
25.0000 mg | ORAL_TABLET | Freq: Every day | ORAL | 1 refills | Status: DC
  Filled 2021-11-10: qty 90, 90d supply, fill #0
  Filled 2022-02-07: qty 90, 90d supply, fill #1

## 2021-11-12 ENCOUNTER — Other Ambulatory Visit (HOSPITAL_COMMUNITY): Payer: Self-pay

## 2021-11-17 ENCOUNTER — Other Ambulatory Visit (HOSPITAL_COMMUNITY): Payer: Self-pay

## 2021-11-22 ENCOUNTER — Other Ambulatory Visit (HOSPITAL_COMMUNITY): Payer: Self-pay

## 2021-12-03 ENCOUNTER — Other Ambulatory Visit (HOSPITAL_COMMUNITY): Payer: Self-pay

## 2021-12-03 MED ORDER — TRAMADOL HCL 50 MG PO TABS
50.0000 mg | ORAL_TABLET | Freq: Three times a day (TID) | ORAL | 0 refills | Status: DC | PRN
  Filled 2021-12-03: qty 90, 30d supply, fill #0

## 2021-12-13 ENCOUNTER — Other Ambulatory Visit (HOSPITAL_COMMUNITY): Payer: Self-pay

## 2021-12-13 DIAGNOSIS — H1013 Acute atopic conjunctivitis, bilateral: Secondary | ICD-10-CM | POA: Diagnosis not present

## 2021-12-13 DIAGNOSIS — H25813 Combined forms of age-related cataract, bilateral: Secondary | ICD-10-CM | POA: Diagnosis not present

## 2021-12-13 MED ORDER — AZELASTINE HCL 0.05 % OP SOLN
1.0000 [drp] | Freq: Two times a day (BID) | OPHTHALMIC | 5 refills | Status: DC
  Filled 2021-12-13: qty 6, 27d supply, fill #0

## 2021-12-14 ENCOUNTER — Other Ambulatory Visit (HOSPITAL_COMMUNITY): Payer: Self-pay

## 2021-12-14 MED ORDER — CLONIDINE HCL 0.2 MG PO TABS
0.2000 mg | ORAL_TABLET | Freq: Two times a day (BID) | ORAL | 1 refills | Status: DC
  Filled 2021-12-14: qty 180, 90d supply, fill #0
  Filled 2022-03-15: qty 180, 90d supply, fill #1

## 2021-12-15 ENCOUNTER — Other Ambulatory Visit (HOSPITAL_COMMUNITY): Payer: Self-pay

## 2021-12-15 DIAGNOSIS — R7303 Prediabetes: Secondary | ICD-10-CM | POA: Diagnosis not present

## 2021-12-15 DIAGNOSIS — M47816 Spondylosis without myelopathy or radiculopathy, lumbar region: Secondary | ICD-10-CM | POA: Diagnosis not present

## 2021-12-15 DIAGNOSIS — R590 Localized enlarged lymph nodes: Secondary | ICD-10-CM | POA: Diagnosis not present

## 2021-12-15 DIAGNOSIS — Z79899 Other long term (current) drug therapy: Secondary | ICD-10-CM | POA: Diagnosis not present

## 2021-12-15 DIAGNOSIS — Z5320 Procedure and treatment not carried out because of patient's decision for unspecified reasons: Secondary | ICD-10-CM | POA: Diagnosis not present

## 2021-12-15 DIAGNOSIS — G4709 Other insomnia: Secondary | ICD-10-CM | POA: Diagnosis not present

## 2021-12-15 DIAGNOSIS — Z23 Encounter for immunization: Secondary | ICD-10-CM | POA: Diagnosis not present

## 2021-12-15 DIAGNOSIS — Z6841 Body Mass Index (BMI) 40.0 and over, adult: Secondary | ICD-10-CM | POA: Diagnosis not present

## 2021-12-15 DIAGNOSIS — Z0000 Encounter for general adult medical examination without abnormal findings: Secondary | ICD-10-CM | POA: Diagnosis not present

## 2021-12-15 MED ORDER — ZOLPIDEM TARTRATE ER 6.25 MG PO TBCR
6.2500 mg | EXTENDED_RELEASE_TABLET | Freq: Every evening | ORAL | 0 refills | Status: AC | PRN
  Filled 2021-12-15: qty 30, 30d supply, fill #0

## 2021-12-15 MED ORDER — GABAPENTIN 100 MG PO CAPS
ORAL_CAPSULE | ORAL | 1 refills | Status: DC
  Filled 2021-12-15: qty 90, 30d supply, fill #0

## 2021-12-16 ENCOUNTER — Other Ambulatory Visit: Payer: Self-pay | Admitting: Nurse Practitioner

## 2021-12-16 ENCOUNTER — Other Ambulatory Visit: Payer: Self-pay | Admitting: Dermatology

## 2021-12-16 DIAGNOSIS — R590 Localized enlarged lymph nodes: Secondary | ICD-10-CM

## 2021-12-31 IMAGING — CT CT CERVICAL SPINE W/O CM
3 of 4 series · 12 of 33 positions shown, 14 images · non-contrast
Comparison: None.

CLINICAL DATA: Neck trauma with focal neuro deficit.

EXAM:
CT CERVICAL SPINE WITHOUT CONTRAST
TECHNIQUE: Multidetector CT imaging of the cervical spine was performed without
intravenous contrast. Multiplanar CT image reconstructions were also
generated.

[Series 7: sagittal bone · sagittal · 0.28mm/px · 5 of 61 slices shown, 6 images]
[im 21/61  bone]
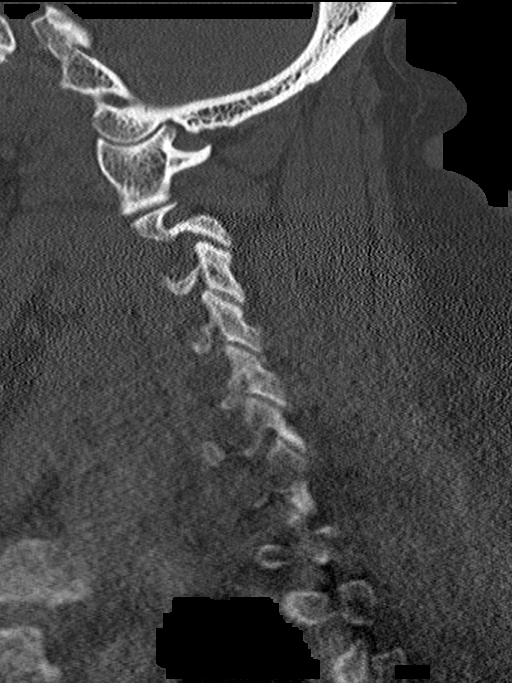
[im 26/61  bone]
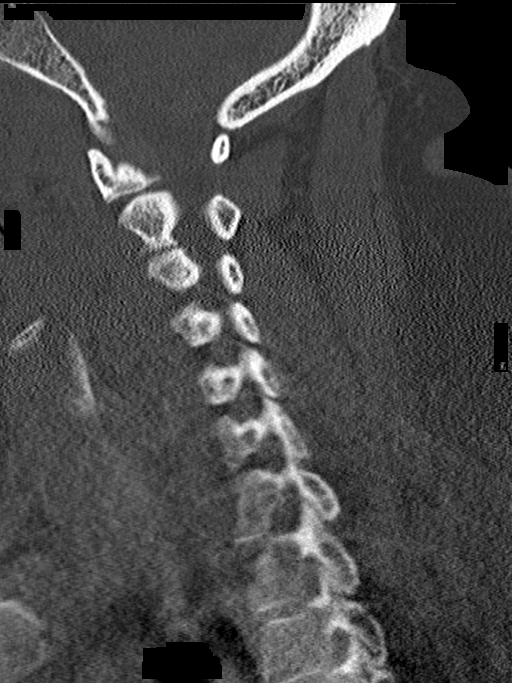
[im 31/61  soft-tissue]
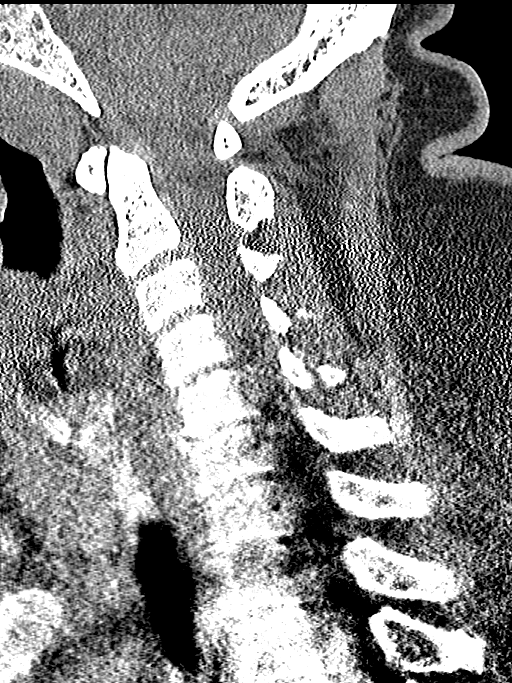
[im 31/61  bone]
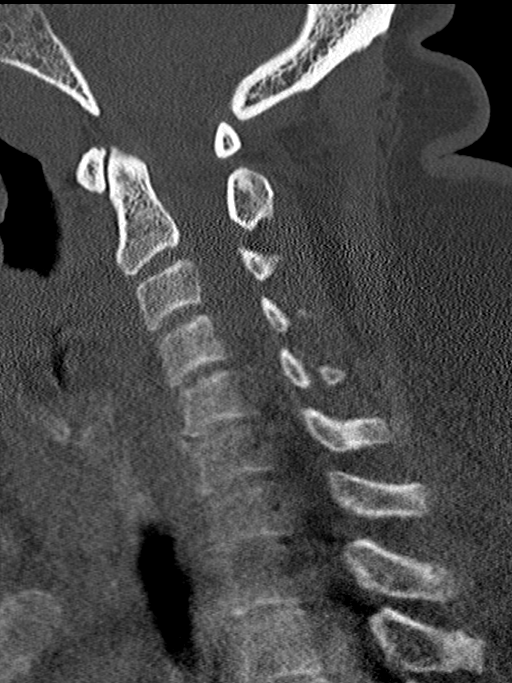
[im 36/61  bone]
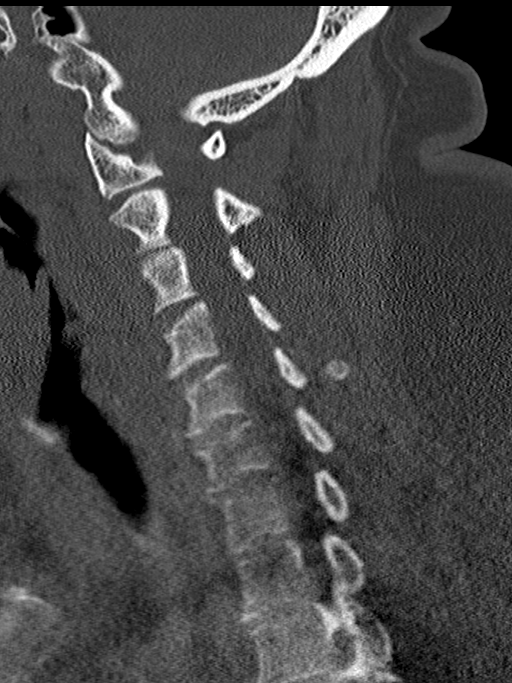
[im 41/61  bone]
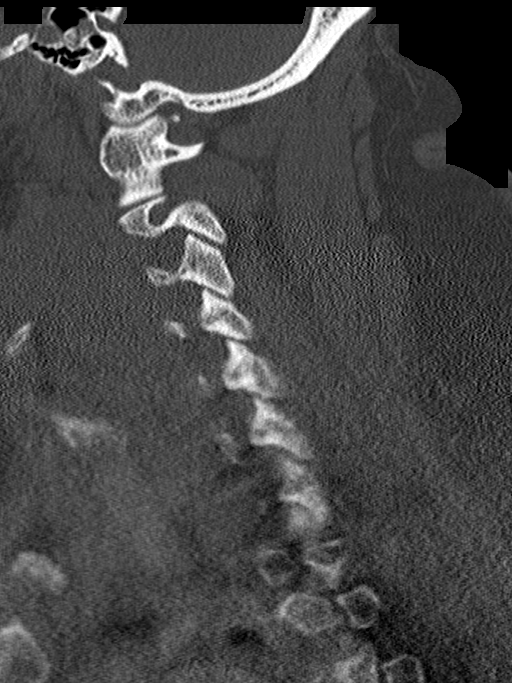

[Series 8: coronal bone · coronal · 0.28mm/px · 3 of 48 slices shown]
[im 10/48  bone]
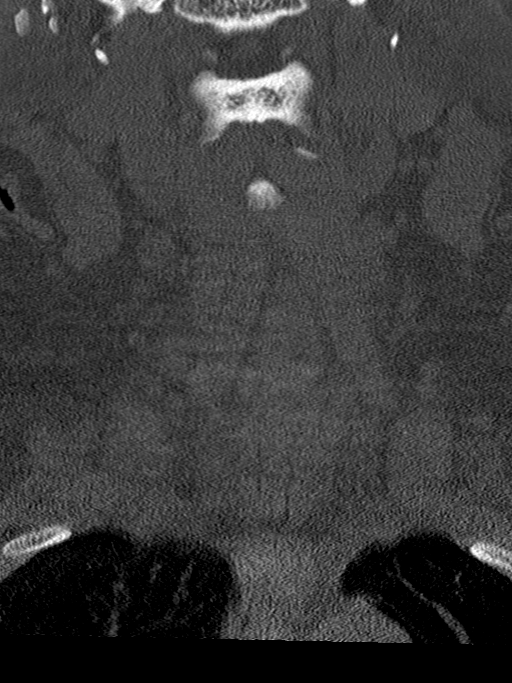
[im 19/48  bone]
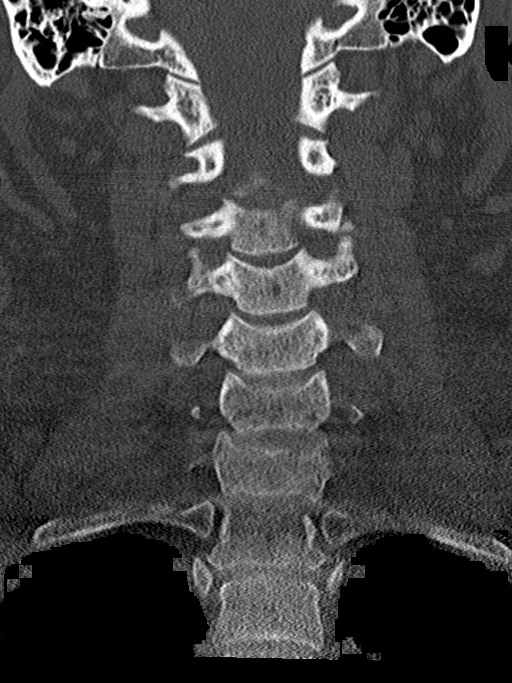
[im 29/48  bone]
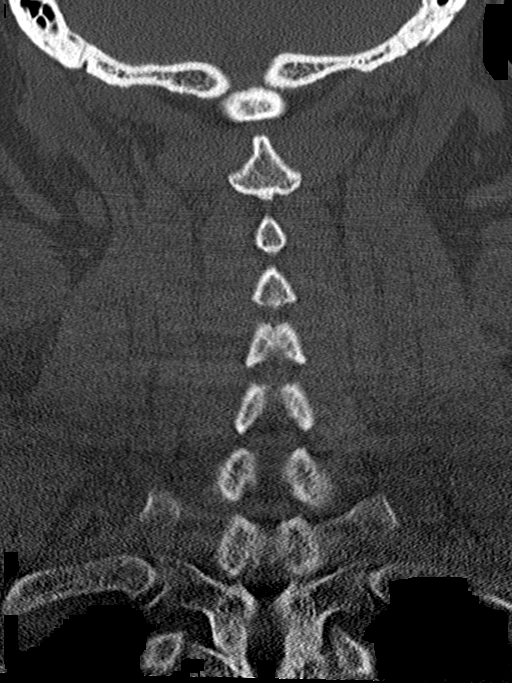

[Series 9: orthogonal bone · axial · 0.18mm/px · z∈[-213,-100]mm · 4 of 92 slices shown, 5 images]
[im 16/92  soft-tissue]
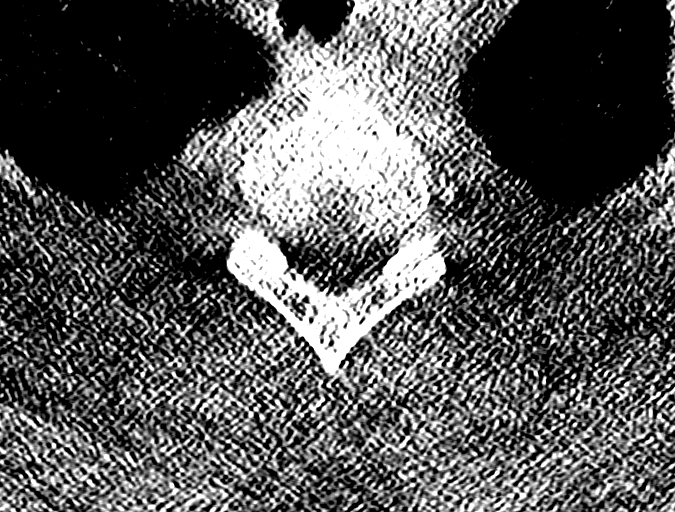
[im 16/92  bone]
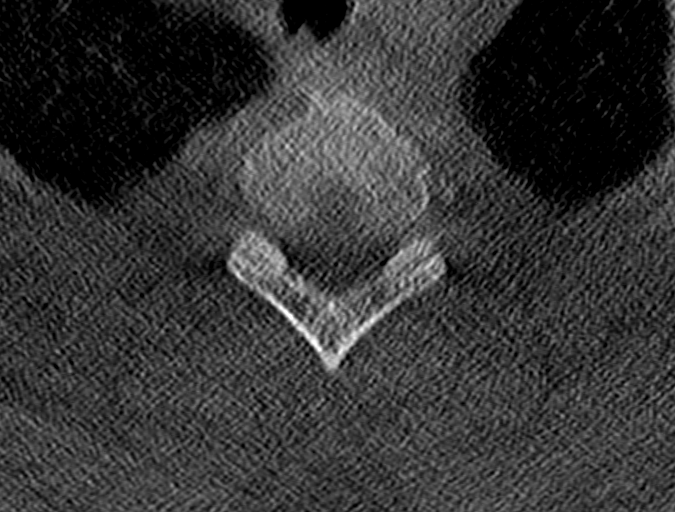
[im 31/92  bone]
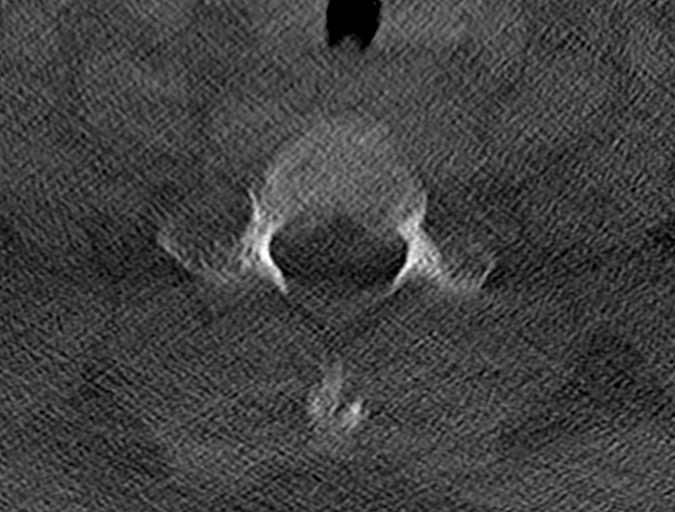
[im 61/92  bone]
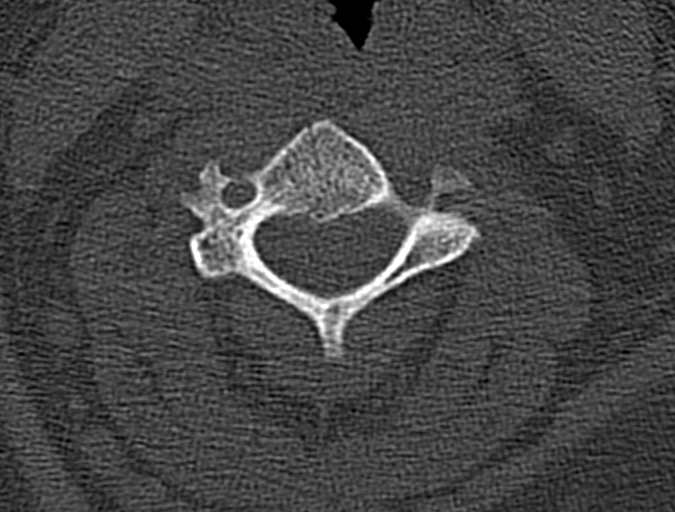
[im 76/92  bone]
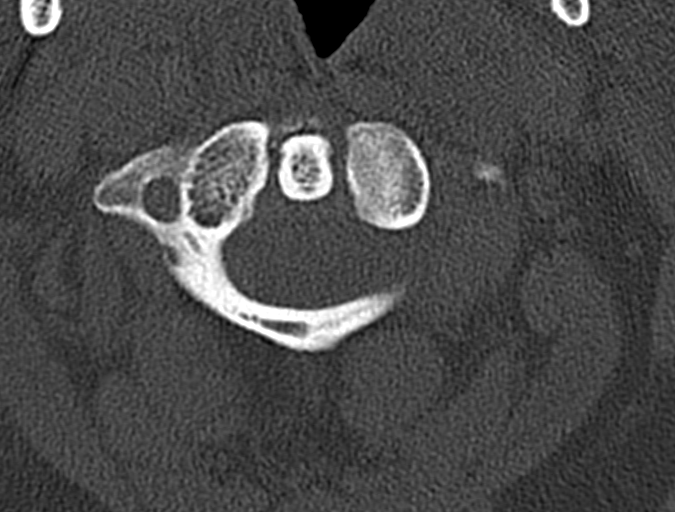

[12 of 33 positions shown; findings below may reference images not displayed]

FINDINGS: Alignment: Normal.

Skull base and vertebrae: No acute fracture. No primary bone lesion
or focal pathologic process.

Soft tissues and spinal canal: No prevertebral fluid or swelling. No
visible canal hematoma.

Disc levels:  No focal or significant degenerative changes.

Upper chest: Negative
IMPRESSION: Negative for cervical spine fracture or subluxation.

## 2022-01-11 ENCOUNTER — Other Ambulatory Visit (HOSPITAL_COMMUNITY): Payer: Self-pay

## 2022-01-11 ENCOUNTER — Other Ambulatory Visit: Payer: Self-pay

## 2022-01-11 MED ORDER — PANTOPRAZOLE SODIUM 40 MG PO TBEC
40.0000 mg | DELAYED_RELEASE_TABLET | Freq: Every day | ORAL | 0 refills | Status: DC
  Filled 2022-01-11 – 2022-01-17 (×3): qty 90, 90d supply, fill #0

## 2022-01-11 MED ORDER — DICLOFENAC SODIUM 1 % EX GEL
CUTANEOUS | 2 refills | Status: DC
  Filled 2022-01-11: qty 500, 41d supply, fill #0
  Filled 2022-01-12: qty 500, 42d supply, fill #0
  Filled 2022-01-24: qty 500, 41d supply, fill #0
  Filled 2022-08-12: qty 500, 41d supply, fill #1

## 2022-01-11 MED ORDER — PRAVASTATIN SODIUM 20 MG PO TABS
20.0000 mg | ORAL_TABLET | Freq: Every evening | ORAL | 0 refills | Status: DC
  Filled 2022-01-11 – 2022-01-17 (×3): qty 90, 90d supply, fill #0

## 2022-01-12 ENCOUNTER — Other Ambulatory Visit (HOSPITAL_COMMUNITY): Payer: Self-pay

## 2022-01-12 ENCOUNTER — Other Ambulatory Visit: Payer: Self-pay

## 2022-01-12 MED ORDER — TRAMADOL HCL 50 MG PO TABS
50.0000 mg | ORAL_TABLET | Freq: Three times a day (TID) | ORAL | 0 refills | Status: DC | PRN
  Filled 2022-01-12 – 2022-01-17 (×2): qty 90, 30d supply, fill #0
  Filled 2022-01-18: qty 60, 20d supply, fill #0
  Filled 2022-01-18: qty 30, 10d supply, fill #0
  Filled 2022-02-01: qty 90, 30d supply, fill #0

## 2022-01-13 ENCOUNTER — Other Ambulatory Visit: Payer: Self-pay

## 2022-01-13 ENCOUNTER — Other Ambulatory Visit (HOSPITAL_COMMUNITY): Payer: Self-pay

## 2022-01-17 ENCOUNTER — Other Ambulatory Visit (HOSPITAL_BASED_OUTPATIENT_CLINIC_OR_DEPARTMENT_OTHER): Payer: Self-pay

## 2022-01-17 ENCOUNTER — Other Ambulatory Visit (HOSPITAL_COMMUNITY): Payer: Self-pay

## 2022-01-17 ENCOUNTER — Other Ambulatory Visit: Payer: Self-pay

## 2022-01-18 ENCOUNTER — Other Ambulatory Visit (HOSPITAL_COMMUNITY): Payer: Self-pay

## 2022-01-24 ENCOUNTER — Other Ambulatory Visit: Payer: Self-pay

## 2022-01-24 ENCOUNTER — Other Ambulatory Visit (HOSPITAL_COMMUNITY): Payer: Self-pay

## 2022-01-25 ENCOUNTER — Other Ambulatory Visit (HOSPITAL_COMMUNITY): Payer: Self-pay

## 2022-01-26 ENCOUNTER — Other Ambulatory Visit: Payer: Self-pay

## 2022-02-01 ENCOUNTER — Other Ambulatory Visit (HOSPITAL_COMMUNITY): Payer: Self-pay

## 2022-02-02 ENCOUNTER — Other Ambulatory Visit (HOSPITAL_COMMUNITY): Payer: Self-pay

## 2022-02-07 ENCOUNTER — Other Ambulatory Visit (HOSPITAL_COMMUNITY): Payer: Self-pay

## 2022-02-09 ENCOUNTER — Other Ambulatory Visit (HOSPITAL_COMMUNITY): Payer: Self-pay

## 2022-02-09 ENCOUNTER — Other Ambulatory Visit: Payer: Self-pay

## 2022-02-09 DIAGNOSIS — U071 COVID-19: Secondary | ICD-10-CM | POA: Diagnosis not present

## 2022-02-09 MED ORDER — PAXLOVID (300/100) 20 X 150 MG & 10 X 100MG PO TBPK
2.0000 | ORAL_TABLET | Freq: Two times a day (BID) | ORAL | 0 refills | Status: DC
  Filled 2022-02-09: qty 30, 5d supply, fill #0

## 2022-02-10 ENCOUNTER — Emergency Department (HOSPITAL_BASED_OUTPATIENT_CLINIC_OR_DEPARTMENT_OTHER)
Admission: EM | Admit: 2022-02-10 | Discharge: 2022-02-10 | Disposition: A | Discharge status: Home / Self Care | Payer: Commercial Managed Care - PPO | Attending: Emergency Medicine | Admitting: Emergency Medicine

## 2022-02-10 ENCOUNTER — Other Ambulatory Visit: Payer: Self-pay

## 2022-02-10 ENCOUNTER — Emergency Department (HOSPITAL_BASED_OUTPATIENT_CLINIC_OR_DEPARTMENT_OTHER): Payer: Commercial Managed Care - PPO

## 2022-02-10 ENCOUNTER — Encounter (HOSPITAL_BASED_OUTPATIENT_CLINIC_OR_DEPARTMENT_OTHER): Payer: Self-pay | Admitting: Emergency Medicine

## 2022-02-10 ENCOUNTER — Other Ambulatory Visit (HOSPITAL_COMMUNITY): Payer: Self-pay

## 2022-02-10 DIAGNOSIS — R0602 Shortness of breath: Secondary | ICD-10-CM | POA: Diagnosis not present

## 2022-02-10 DIAGNOSIS — I10 Essential (primary) hypertension: Secondary | ICD-10-CM | POA: Diagnosis not present

## 2022-02-10 DIAGNOSIS — Z79899 Other long term (current) drug therapy: Secondary | ICD-10-CM | POA: Insufficient documentation

## 2022-02-10 DIAGNOSIS — J385 Laryngeal spasm: Secondary | ICD-10-CM | POA: Insufficient documentation

## 2022-02-10 DIAGNOSIS — U071 COVID-19: Secondary | ICD-10-CM | POA: Insufficient documentation

## 2022-02-10 LAB — CBC WITH DIFFERENTIAL/PLATELET
Abs Immature Granulocytes: 0.03 10*3/uL (ref 0.00–0.07)
Basophils Absolute: 0 10*3/uL (ref 0.0–0.1)
Basophils Relative: 1 %
Eosinophils Absolute: 0.3 10*3/uL (ref 0.0–0.5)
Eosinophils Relative: 6 %
HCT: 40.8 % (ref 36.0–46.0)
Hemoglobin: 14 g/dL (ref 12.0–15.0)
Immature Granulocytes: 1 %
Lymphocytes Relative: 37 %
Lymphs Abs: 1.8 10*3/uL (ref 0.7–4.0)
MCH: 26.4 pg (ref 26.0–34.0)
MCHC: 34.3 g/dL (ref 30.0–36.0)
MCV: 77 fL — ABNORMAL LOW (ref 80.0–100.0)
Monocytes Absolute: 0.7 10*3/uL (ref 0.1–1.0)
Monocytes Relative: 15 %
Neutro Abs: 1.9 10*3/uL (ref 1.7–7.7)
Neutrophils Relative %: 40 %
Platelets: 221 10*3/uL (ref 150–400)
RBC: 5.3 MIL/uL — ABNORMAL HIGH (ref 3.87–5.11)
RDW: 16.4 % — ABNORMAL HIGH (ref 11.5–15.5)
WBC: 4.8 10*3/uL (ref 4.0–10.5)
nRBC: 0 % (ref 0.0–0.2)

## 2022-02-10 LAB — BASIC METABOLIC PANEL
Anion gap: 10 (ref 5–15)
BUN: 9 mg/dL (ref 8–23)
CO2: 28 mmol/L (ref 22–32)
Calcium: 8.8 mg/dL — ABNORMAL LOW (ref 8.9–10.3)
Chloride: 97 mmol/L — ABNORMAL LOW (ref 98–111)
Creatinine, Ser: 0.54 mg/dL (ref 0.44–1.00)
GFR, Estimated: >60 mL/min (ref >60)
Glucose, Bld: 127 mg/dL — ABNORMAL HIGH (ref 70–99)
Potassium: 3.1 mmol/L — ABNORMAL LOW (ref 3.5–5.1)
Sodium: 135 mmol/L (ref 135–145)

## 2022-02-10 LAB — CBG MONITORING, ED: Glucose-Capillary: 140 mg/dL — ABNORMAL HIGH (ref 70–99)

## 2022-02-10 LAB — BRAIN NATRIURETIC PEPTIDE: B Natriuretic Peptide: 42.5 pg/mL (ref 0.0–100.0)

## 2022-02-10 LAB — TROPONIN I (HIGH SENSITIVITY)
Troponin I (High Sensitivity): 8 ng/L (ref <18)
Troponin I (High Sensitivity): 9 ng/L (ref <18)

## 2022-02-10 MED ORDER — DEXAMETHASONE SODIUM PHOSPHATE 10 MG/ML IJ SOLN
10.0000 mg | Freq: Once | INTRAMUSCULAR | Status: AC
  Administered 2022-02-10: 10 mg via INTRAMUSCULAR
  Filled 2022-02-10: qty 1

## 2022-02-10 NOTE — ED Triage Notes (Signed)
Pt states she has had shortness of breath for the past couple of days worse tonight  Pt states she was diagnosed with covid today  Pt is very short of breath on arrival  Pt states she feels like something is blocking her airway  Pt is hypertensive but has hx of HTN is on medication

## 2022-02-10 NOTE — ED Provider Notes (Signed)
[facility]  Provider Note  CSN: [id number] Arrival date & time: 02/10/22 0257  History Chief Complaint  Patient presents with   Shortness of Breath    Ashley Warren is a 67 y.o. female with history of HTN has had URI symptoms for the last few days, diagnosed with Covid 1/31 and tonight reports when she had a coughing spell, she felt like her throat was closing up. She has been taking OTC meds with stimulant decongestant. She has not had CP. Feeling better now. No N/V/D, no leg swelling. She is not diabetic.    Home Medications Prior to Admission medications   Medication Sig Start Date End Date Taking? Authorizing Provider  albuterol (PROVENTIL HFA;VENTOLIN HFA) 108 (90 BASE) MCG/ACT inhaler Inhale 2 puffs into the lungs every 4 (four) hours as needed for wheezing or shortness of breath. 09/30/13   Waldemar Dickens, MD  albuterol (VENTOLIN HFA) 108 (90 Base) MCG/ACT inhaler Inhale 2 puffs into the lungs every 6 (six) hours as needed. 06/15/20     amLODipine (NORVASC) 10 MG tablet TAKE 1 TABLET BY MOUTH ONCE DAILY 10/27/21     Ascorbic Acid (VITAMIN C CR) 500 MG CPCR Take 500 mg by mouth daily.    [provider]  azelastine (OPTIVAR) 0.05 % ophthalmic solution Place 1 drop into both eyes 2 (two) times daily. 12/13/21     B Complex Vitamins (B COMPLEX PO) Take 1 tablet by mouth daily.    [provider]  BACILLUS COAGULANS-INULIN PO Take 2 tablets by mouth daily.    [provider]  Black Cohosh 160 MG CAPS Take 160 mg by mouth daily.    [provider]  Boswellia Serrata (BOSWELLIA PO) Take 375 mg by mouth daily.    [provider]  budesonide-formoterol (SYMBICORT) 160-4.5 MCG/ACT inhaler Inhale 2 puffs into the lungs 2 (two) times daily.    [provider]  calcium carbonate (OS-CAL) 1250 (500 Ca) MG chewable tablet Chew 1,250 mg by mouth daily.    [provider]  celecoxib  (CELEBREX) 200 MG capsule Take 1 capsule by mouth daily 06/11/20     cloNIDine (CATAPRES) 0.2 MG tablet TAKE 1 TABLET BY MOUTH 2 TIMES DAILY Patient taking differently: Take 0.2 mg by mouth 2 (two) times daily. 06/18/19 06/17/20  Berkley Harvey, NP  cloNIDine (CATAPRES) 0.2 MG tablet Take 1 tablet (0.2 mg total) by mouth 2 (two) times daily. 12/14/21     diclofenac Sodium (VOLTAREN) 1 % GEL APPLY 4 GRAMS 3 TIMES DAILY TO AFFECTED AREA(S) 01/11/22     ergocalciferol (VITAMIN D2) 1.25 MG (50000 UT) capsule Take 50,000 Units by mouth 2 (two) times a week. No specific days    [provider]  fluticasone (FLONASE) 50 MCG/ACT nasal spray Administer 2 sprays in each nostril daily. 06/29/21     gabapentin (NEURONTIN) 100 MG capsule Take 1 capsule by mouth at bedtime for 1 week, then increase to 2 capsules for 1 week, then increase to 3 capsules if tolerated 10/27/21     gabapentin (NEURONTIN) 100 MG capsule Take 1 capsule (100 mg total) by mouth at bedtime for 7 days, THEN 2 capsules (200 mg total) at bedtime for 7 days, THEN 3 capsules (300 mg total) at bedtime if tolerated 12/15/21 01/21/22    hydrochlorothiazide (HYDRODIURIL) 25 MG tablet Take 1 tablet (25 mg total) by mouth daily. 11/10/21     ibuprofen (ADVIL) 200 MG tablet  Take 4 tablets by mouth 2  times daily as needed. 05/21/20   Dwyane Dee, MD  lisinopril (ZESTRIL) 40 MG tablet Take 1 tablet (40 mg total) by mouth daily. 05/21/20   Dwyane Dee, MD  lisinopril (ZESTRIL) 40 MG tablet TAKE 1 TABLET BY MOUTH TWICE DAILY 08/23/21     Milk Thistle 150 MG CAPS Take 150 mg by mouth daily.    [provider]  mupirocin nasal ointment (BACTROBAN) 2 % Place 1 application into the nose 2 (two) times daily as needed (nasal itching). Use one-half of tube in each nostril twice daily for five (5) days. After application, press sides of nose together and gently massage.    [provider]  nirmatrelvir & ritonavir (PAXLOVID, 300/100,) 20 x 150 MG  & 10 x '100MG'$  TBPK Take 300 mg nirmatrelvir (two 150 mg tablets) with 100 mg ritonavir (one 100 mg tablet) by mouth twice daily for 5 days 02/09/22     nystatin (MYCOSTATIN/NYSTOP) powder USE TWICE A DAY FOR 10 DAYS THEN AS NEEDED 10/27/21     OVER THE COUNTER MEDICATION Take 2 capsules by mouth daily. FISH OIL OMEGA 3 VIT C VIT E 2000-650-'12MG'$     [provider]  pantoprazole (PROTONIX) 40 MG tablet Take 1 tablet (40 mg total) by mouth daily. 01/11/22     phentermine (ADIPEX-P) 37.5 MG tablet Take 1 tablet by mouth daily before breakfast. 11/03/20     polyvinyl alcohol (LIQUIFILM TEARS) 1.4 % ophthalmic solution Place 1 drop into both eyes daily.    [provider]  potassium chloride (KLOR-CON) 10 MEQ tablet Take 1 tablet (10 mEq total) by mouth daily for 5 days. 11/09/20 11/14/20  Alroy Bailiff, Margaux, PA-C  potassium chloride (KLOR-CON) 10 MEQ tablet Take 1 tablet (10 mEq total) by mouth daily for 5 days. 01/20/21     potassium chloride SA (KLOR-CON M) 20 MEQ tablet Take 2 tablets (40 mEq total) by mouth daily. 08/03/21     potassium chloride SA (KLOR-CON) 20 MEQ tablet TAKE 2 TABLETS BY MOUTH DAILY 05/11/20     pravastatin (PRAVACHOL) 20 MG tablet TAKE 1 TABLET BY MOUTH EVERY EVENING Patient taking differently: Take 20 mg by mouth every evening. 01/16/20 01/15/21  Berkley Harvey, NP  pravastatin (PRAVACHOL) 20 MG tablet Take 1 tablet (20 mg total) by mouth Nightly. 01/11/22     Semaglutide,0.25 or 0.'5MG'$ /DOS, (OZEMPIC, 0.25 OR 0.5 MG/DOSE,) 2 MG/3ML SOPN Inject 0.'25mg'$  into the skin once a week for 30 days 03/28/21     Semaglutide-Weight Management (WEGOVY) 0.25 MG/0.5ML SOAJ Inject 0.25 mg into the skin every 7 days. 06/29/21     Spacer/Aero-Holding Chambers DEVI Use as directed 09/30/13   Waldemar Dickens, MD  traMADol (ULTRAM) 50 MG tablet Take 1 tablet by mouth every 8 hours as needed for pain. 02/01/21     traMADol (ULTRAM) 50 MG tablet Take 1 tablet (50 mg total) by mouth every 8 (eight) hours as  needed for pain. 01/12/22     triamcinolone cream (KENALOG) 0.1 % Apply to the affected area(s) as needed 10/27/21     vitamin C (ASCORBIC ACID) 250 MG tablet Take 250 mg by mouth daily.    [provider]  zolpidem (AMBIEN CR) 6.25 MG CR tablet Take 1 tablet (6.25 mg total) by mouth at bedtime as needed for sleep 12/15/21     zolpidem (AMBIEN) 5 MG tablet Take 1 tablet (5 mg total) by mouth at bedtime as needed for sleep. 09/23/21  Allergies    Penicillins   Review of Systems   Review of Systems Please see HPI for pertinent positives and negatives  Physical Exam BP (!) 156/89   Pulse 72   Temp 98.8 F (37.1 C) (Oral)   Resp 11   Ht '5\' 2"'$  (1.575 m)   Wt (!) 156.5 kg   SpO2 98%   BMI 63.10 kg/m   Physical Exam Vitals and nursing note reviewed.  Constitutional:      Appearance: Normal appearance.  HENT:     Head: Normocephalic and atraumatic.     Nose: Nose normal.     Mouth/Throat:     Mouth: Mucous membranes are moist.  Eyes:     Extraocular Movements: Extraocular movements intact.     Conjunctiva/sclera: Conjunctivae normal.  Cardiovascular:     Rate and Rhythm: Normal rate.  Pulmonary:     Effort: Pulmonary effort is normal.     Breath sounds: Normal breath sounds. No stridor. No wheezing or rhonchi.  Abdominal:     General: Abdomen is flat.     Palpations: Abdomen is soft.     Tenderness: There is no abdominal tenderness.  Musculoskeletal:        General: No swelling. Normal range of motion.     Cervical back: Neck supple.     Right lower leg: No edema.     Left lower leg: No edema.  Skin:    General: Skin is warm and dry.  Neurological:     General: No focal deficit present.     Mental Status: She is alert.  Psychiatric:        Mood and Affect: Mood normal.     ED Results / Procedures / Treatments   EKG EKG Interpretation  Date/Time:  Thursday February 10 2022 03:13:31 EST Ventricular Rate:  97 PR Interval:  202 QRS  Duration: 107 QT Interval:  401 QTC Calculation: 510 R Axis:   -44 Text Interpretation: Sinus rhythm Ventricular premature complex Incomplete RBBB and LAFB RSR' in V1 or V2, right VCD or RVH Probable left ventricular hypertrophy Prolonged QT interval No significant change since last tracing pvcs now present Confirmed by Calvert Cantor [phone]) on 02/10/2022 3:16:47 AM  Procedures Procedures  Medications Ordered in the ED Medications  dexamethasone (DECADRON) injection 10 mg (10 mg Intramuscular Given 02/10/22 0516)    Initial Impression and Plan  Patient with recent URI diagnosed with Covid, now with cough and what sounds like a laryngospasm when coughing. Currently exam is benign. Not hypoxic but very hypertensive on arrival. Could be from taking OTC decongestants, will check labs and CXR for signs of CHF/end organ damage.   ED Course   Clinical Course as of 02/10/22 0600  Thu Feb 10, 2022  0405 BP improved without intervention.  [CS]  0431 CBC is normal.  [CS]  0446 Trop is normal.  [CS]  7903 Patient has had another episode of laryngospasm after a coughing spell. She was not having any stridor or wheezing. Symptoms resolved in less than a minute. If she does not have signs of AKI or hyperglycemia, will give a dose of decadron.  [CS]  0452 BNP is normal.  [CS]  0502 I personally viewed the images from radiology studies and agree with radiologist interpretation: CXR is clear [CS]  8333 BMP is unremarkable.  [CS]  8329 Delta trop is normal.  [CS]  [id number] Patient reports she is feeling better, able to cough up sputum a little easier now and  no further episodes of laryngospasm. Recommend she continue with OTC medications for Covid as well as Paxlovid prescribed yesterday. Drink plenty of fluids and follow up with PCP. RTED for any worsening issues breathing, low SpO2 or any other concerns. She is comfortable with that plan and eager to go home this morning.  [CS]    Clinical Course User  Index [CS] Truddie Hidden, MD     MDM Rules/Calculators/A&P Medical Decision Making Given presenting complaint, I considered that admission might be necessary. After review of results from ED lab and/or imaging studies, admission to the hospital is not indicated at this time.    Problems Addressed: COVID-19: acute illness or injury Laryngospasm: acute illness or injury  Amount and/or Complexity of Data Reviewed Labs: ordered. Decision-making details documented in ED Course. Radiology: ordered and independent interpretation performed. Decision-making details documented in ED Course. ECG/medicine tests: ordered and independent interpretation performed. Decision-making details documented in ED Course.  Risk Prescription drug management. Decision regarding hospitalization.     Final Clinical Impression(s) / ED Diagnoses Final diagnoses:  COVID-19  Laryngospasm    Rx / DC Orders ED Discharge Orders     None        Truddie Hidden, MD 02/10/22 0600

## 2022-02-15 ENCOUNTER — Other Ambulatory Visit (HOSPITAL_COMMUNITY): Payer: Self-pay

## 2022-02-15 ENCOUNTER — Other Ambulatory Visit: Payer: Self-pay

## 2022-02-15 MED ORDER — ZOLPIDEM TARTRATE ER 6.25 MG PO TBCR
6.2500 mg | EXTENDED_RELEASE_TABLET | Freq: Every evening | ORAL | 0 refills | Status: DC | PRN
  Filled 2022-02-15: qty 30, 30d supply, fill #0

## 2022-02-21 ENCOUNTER — Other Ambulatory Visit (HOSPITAL_COMMUNITY): Payer: Self-pay

## 2022-02-22 ENCOUNTER — Other Ambulatory Visit: Payer: Self-pay

## 2022-02-22 ENCOUNTER — Other Ambulatory Visit (HOSPITAL_COMMUNITY): Payer: Self-pay

## 2022-02-22 MED ORDER — LISINOPRIL 40 MG PO TABS
40.0000 mg | ORAL_TABLET | Freq: Two times a day (BID) | ORAL | 1 refills | Status: DC
  Filled 2022-02-22: qty 180, 90d supply, fill #0
  Filled 2022-05-23: qty 180, 90d supply, fill #1

## 2022-03-04 ENCOUNTER — Other Ambulatory Visit (HOSPITAL_COMMUNITY): Payer: Self-pay

## 2022-03-05 ENCOUNTER — Other Ambulatory Visit (HOSPITAL_COMMUNITY): Payer: Self-pay

## 2022-03-05 MED ORDER — TRAMADOL HCL 50 MG PO TABS
50.0000 mg | ORAL_TABLET | Freq: Three times a day (TID) | ORAL | 0 refills | Status: DC | PRN
  Filled 2022-03-05: qty 90, 30d supply, fill #0

## 2022-03-07 ENCOUNTER — Other Ambulatory Visit: Payer: Self-pay

## 2022-03-07 DIAGNOSIS — Z23 Encounter for immunization: Secondary | ICD-10-CM | POA: Diagnosis not present

## 2022-03-15 ENCOUNTER — Other Ambulatory Visit: Payer: Self-pay

## 2022-04-15 ENCOUNTER — Other Ambulatory Visit (HOSPITAL_COMMUNITY): Payer: Self-pay

## 2022-04-18 ENCOUNTER — Other Ambulatory Visit (HOSPITAL_COMMUNITY): Payer: Self-pay

## 2022-04-18 MED ORDER — PRAVASTATIN SODIUM 20 MG PO TABS
20.0000 mg | ORAL_TABLET | Freq: Every evening | ORAL | 0 refills | Status: DC
  Filled 2022-04-18: qty 90, 90d supply, fill #0

## 2022-04-18 MED ORDER — PANTOPRAZOLE SODIUM 40 MG PO TBEC
40.0000 mg | DELAYED_RELEASE_TABLET | Freq: Every day | ORAL | 0 refills | Status: DC
  Filled 2022-04-18: qty 90, 90d supply, fill #0

## 2022-04-19 ENCOUNTER — Other Ambulatory Visit: Payer: Self-pay

## 2022-04-19 ENCOUNTER — Other Ambulatory Visit (HOSPITAL_COMMUNITY): Payer: Self-pay

## 2022-04-19 MED ORDER — TRAMADOL HCL 50 MG PO TABS
50.0000 mg | ORAL_TABLET | Freq: Three times a day (TID) | ORAL | 0 refills | Status: DC | PRN
  Filled 2022-04-19: qty 90, 30d supply, fill #0

## 2022-04-25 ENCOUNTER — Other Ambulatory Visit (HOSPITAL_COMMUNITY): Payer: Self-pay

## 2022-04-27 ENCOUNTER — Other Ambulatory Visit: Payer: Self-pay

## 2022-04-27 ENCOUNTER — Other Ambulatory Visit (HOSPITAL_COMMUNITY): Payer: Self-pay

## 2022-04-27 MED ORDER — ZOLPIDEM TARTRATE ER 6.25 MG PO TBCR
6.2500 mg | EXTENDED_RELEASE_TABLET | Freq: Every evening | ORAL | 0 refills | Status: DC | PRN
  Filled 2022-04-27: qty 30, 30d supply, fill #0

## 2022-04-28 ENCOUNTER — Other Ambulatory Visit (HOSPITAL_COMMUNITY): Payer: Self-pay

## 2022-04-29 ENCOUNTER — Other Ambulatory Visit (HOSPITAL_COMMUNITY): Payer: Self-pay

## 2022-04-29 MED ORDER — AMLODIPINE BESYLATE 10 MG PO TABS
10.0000 mg | ORAL_TABLET | Freq: Every day | ORAL | 1 refills | Status: DC
  Filled 2022-04-29: qty 90, 90d supply, fill #0
  Filled 2022-07-27: qty 90, 90d supply, fill #1

## 2022-05-09 ENCOUNTER — Other Ambulatory Visit (HOSPITAL_COMMUNITY): Payer: Self-pay

## 2022-05-10 ENCOUNTER — Other Ambulatory Visit (HOSPITAL_COMMUNITY): Payer: Self-pay

## 2022-05-10 MED ORDER — HYDROCHLOROTHIAZIDE 25 MG PO TABS
25.0000 mg | ORAL_TABLET | Freq: Every day | ORAL | 1 refills | Status: DC
  Filled 2022-05-10: qty 90, 90d supply, fill #0
  Filled 2022-08-12: qty 90, 90d supply, fill #1

## 2022-05-18 ENCOUNTER — Other Ambulatory Visit (HOSPITAL_COMMUNITY): Payer: Self-pay

## 2022-05-23 ENCOUNTER — Other Ambulatory Visit: Payer: Self-pay

## 2022-05-26 ENCOUNTER — Other Ambulatory Visit (HOSPITAL_COMMUNITY): Payer: Self-pay

## 2022-05-27 ENCOUNTER — Other Ambulatory Visit: Payer: Self-pay

## 2022-05-27 ENCOUNTER — Other Ambulatory Visit (HOSPITAL_COMMUNITY): Payer: Self-pay

## 2022-05-27 MED ORDER — TRAMADOL HCL 50 MG PO TABS
50.0000 mg | ORAL_TABLET | Freq: Three times a day (TID) | ORAL | 0 refills | Status: DC | PRN
  Filled 2022-05-27: qty 90, 30d supply, fill #0

## 2022-06-02 ENCOUNTER — Other Ambulatory Visit (HOSPITAL_COMMUNITY): Payer: Self-pay

## 2022-06-03 ENCOUNTER — Other Ambulatory Visit: Payer: Self-pay

## 2022-06-07 ENCOUNTER — Other Ambulatory Visit (HOSPITAL_COMMUNITY): Payer: Self-pay

## 2022-06-13 ENCOUNTER — Other Ambulatory Visit (HOSPITAL_COMMUNITY): Payer: Self-pay

## 2022-06-14 ENCOUNTER — Other Ambulatory Visit (HOSPITAL_COMMUNITY): Payer: Self-pay

## 2022-06-14 MED ORDER — CLONIDINE HCL 0.2 MG PO TABS
0.2000 mg | ORAL_TABLET | Freq: Two times a day (BID) | ORAL | 1 refills | Status: DC
  Filled 2022-06-14 (×2): qty 180, 90d supply, fill #0
  Filled 2022-09-07: qty 180, 90d supply, fill #1

## 2022-07-06 ENCOUNTER — Other Ambulatory Visit (HOSPITAL_COMMUNITY): Payer: Self-pay

## 2022-07-08 ENCOUNTER — Other Ambulatory Visit (HOSPITAL_COMMUNITY): Payer: Self-pay

## 2022-07-08 MED ORDER — TRAMADOL HCL 50 MG PO TABS
50.0000 mg | ORAL_TABLET | Freq: Three times a day (TID) | ORAL | 0 refills | Status: DC | PRN
  Filled 2022-07-08: qty 90, 30d supply, fill #0

## 2022-07-08 MED ORDER — ZOLPIDEM TARTRATE ER 6.25 MG PO TBCR
6.2500 mg | EXTENDED_RELEASE_TABLET | Freq: Every evening | ORAL | 0 refills | Status: DC | PRN
  Filled 2022-07-08: qty 30, 30d supply, fill #0

## 2022-07-11 ENCOUNTER — Other Ambulatory Visit (HOSPITAL_COMMUNITY): Payer: Self-pay

## 2022-07-11 ENCOUNTER — Other Ambulatory Visit: Payer: Self-pay

## 2022-07-11 MED ORDER — PANTOPRAZOLE SODIUM 40 MG PO TBEC
40.0000 mg | DELAYED_RELEASE_TABLET | Freq: Every day | ORAL | 0 refills | Status: DC
  Filled 2022-07-11: qty 90, 90d supply, fill #0

## 2022-07-11 MED ORDER — PRAVASTATIN SODIUM 20 MG PO TABS
20.0000 mg | ORAL_TABLET | Freq: Every evening | ORAL | 0 refills | Status: DC
  Filled 2022-07-11: qty 90, 90d supply, fill #0

## 2022-07-27 ENCOUNTER — Other Ambulatory Visit (HOSPITAL_COMMUNITY): Payer: Self-pay

## 2022-08-12 ENCOUNTER — Other Ambulatory Visit (HOSPITAL_COMMUNITY): Payer: Self-pay

## 2022-08-16 ENCOUNTER — Other Ambulatory Visit (HOSPITAL_COMMUNITY): Payer: Self-pay

## 2022-08-16 MED ORDER — FLUTICASONE PROPIONATE 50 MCG/ACT NA SUSP
NASAL | 0 refills | Status: DC
  Filled 2022-08-16: qty 48, 90d supply, fill #0

## 2022-08-17 ENCOUNTER — Other Ambulatory Visit (HOSPITAL_COMMUNITY): Payer: Self-pay

## 2022-08-17 ENCOUNTER — Other Ambulatory Visit: Payer: Self-pay

## 2022-08-17 MED ORDER — TRAMADOL HCL 50 MG PO TABS
50.0000 mg | ORAL_TABLET | Freq: Three times a day (TID) | ORAL | 0 refills | Status: DC | PRN
  Filled 2022-08-17: qty 90, 30d supply, fill #0

## 2022-08-22 ENCOUNTER — Other Ambulatory Visit (HOSPITAL_COMMUNITY): Payer: Self-pay

## 2022-08-22 MED ORDER — LISINOPRIL 40 MG PO TABS
40.0000 mg | ORAL_TABLET | Freq: Two times a day (BID) | ORAL | 1 refills | Status: DC
  Filled 2022-08-22: qty 180, 90d supply, fill #0
  Filled 2022-11-14: qty 180, 90d supply, fill #1

## 2022-08-23 ENCOUNTER — Other Ambulatory Visit (HOSPITAL_COMMUNITY): Payer: Self-pay

## 2022-09-08 ENCOUNTER — Other Ambulatory Visit (HOSPITAL_COMMUNITY): Payer: Self-pay

## 2022-09-16 ENCOUNTER — Other Ambulatory Visit (HOSPITAL_COMMUNITY): Payer: Self-pay

## 2022-09-20 ENCOUNTER — Other Ambulatory Visit: Payer: Self-pay

## 2022-09-20 ENCOUNTER — Other Ambulatory Visit (HOSPITAL_COMMUNITY): Payer: Self-pay

## 2022-09-20 MED ORDER — TRAMADOL HCL 50 MG PO TABS
50.0000 mg | ORAL_TABLET | Freq: Three times a day (TID) | ORAL | 0 refills | Status: DC | PRN
  Filled 2022-09-20: qty 90, 30d supply, fill #0

## 2022-09-21 DIAGNOSIS — I10 Essential (primary) hypertension: Secondary | ICD-10-CM | POA: Diagnosis not present

## 2022-09-21 DIAGNOSIS — M2559 Pain in other specified joint: Secondary | ICD-10-CM | POA: Diagnosis not present

## 2022-09-21 DIAGNOSIS — R7303 Prediabetes: Secondary | ICD-10-CM | POA: Diagnosis not present

## 2022-09-26 ENCOUNTER — Other Ambulatory Visit (HOSPITAL_COMMUNITY): Payer: Self-pay

## 2022-09-29 ENCOUNTER — Other Ambulatory Visit (HOSPITAL_COMMUNITY): Payer: Self-pay

## 2022-09-29 MED ORDER — WEGOVY 0.25 MG/0.5ML ~~LOC~~ SOAJ
0.2500 mg | SUBCUTANEOUS | 0 refills | Status: DC
  Filled 2022-09-29 – 2022-12-02 (×2): qty 2, 28d supply, fill #0

## 2022-10-06 ENCOUNTER — Other Ambulatory Visit (HOSPITAL_COMMUNITY): Payer: Self-pay

## 2022-10-07 ENCOUNTER — Other Ambulatory Visit (HOSPITAL_COMMUNITY): Payer: Self-pay

## 2022-10-07 MED ORDER — PRAVASTATIN SODIUM 20 MG PO TABS
20.0000 mg | ORAL_TABLET | Freq: Every evening | ORAL | 0 refills | Status: DC
  Filled 2022-10-07: qty 90, 90d supply, fill #0

## 2022-10-13 ENCOUNTER — Other Ambulatory Visit (HOSPITAL_COMMUNITY): Payer: Self-pay

## 2022-10-18 ENCOUNTER — Other Ambulatory Visit: Payer: Self-pay

## 2022-10-18 ENCOUNTER — Other Ambulatory Visit (HOSPITAL_COMMUNITY): Payer: Self-pay

## 2022-10-18 MED ORDER — PANTOPRAZOLE SODIUM 40 MG PO TBEC
40.0000 mg | DELAYED_RELEASE_TABLET | Freq: Every day | ORAL | 0 refills | Status: DC
  Filled 2022-10-18: qty 90, 90d supply, fill #0

## 2022-10-18 MED ORDER — TRAMADOL HCL 50 MG PO TABS
50.0000 mg | ORAL_TABLET | Freq: Four times a day (QID) | ORAL | 0 refills | Status: DC | PRN
  Filled 2022-10-18: qty 120, 30d supply, fill #0

## 2022-10-24 ENCOUNTER — Other Ambulatory Visit (HOSPITAL_COMMUNITY): Payer: Self-pay

## 2022-10-26 ENCOUNTER — Other Ambulatory Visit (HOSPITAL_COMMUNITY): Payer: Self-pay

## 2022-10-26 ENCOUNTER — Other Ambulatory Visit: Payer: Self-pay

## 2022-10-26 MED ORDER — ZOLPIDEM TARTRATE ER 6.25 MG PO TBCR
6.2500 mg | EXTENDED_RELEASE_TABLET | Freq: Every evening | ORAL | 0 refills | Status: DC | PRN
  Filled 2022-10-26: qty 30, 30d supply, fill #0

## 2022-10-31 ENCOUNTER — Other Ambulatory Visit (HOSPITAL_COMMUNITY): Payer: Self-pay

## 2022-10-31 MED ORDER — AMLODIPINE BESYLATE 10 MG PO TABS
10.0000 mg | ORAL_TABLET | Freq: Every day | ORAL | 1 refills | Status: DC
  Filled 2022-10-31: qty 90, 90d supply, fill #0
  Filled 2023-01-30: qty 90, 90d supply, fill #1

## 2022-11-01 ENCOUNTER — Other Ambulatory Visit (HOSPITAL_COMMUNITY): Payer: Self-pay

## 2022-11-14 ENCOUNTER — Other Ambulatory Visit (HOSPITAL_COMMUNITY): Payer: Self-pay

## 2022-11-14 MED ORDER — HYDROCHLOROTHIAZIDE 25 MG PO TABS
25.0000 mg | ORAL_TABLET | Freq: Every day | ORAL | 1 refills | Status: DC
  Filled 2022-11-14: qty 90, 90d supply, fill #0
  Filled 2023-01-30: qty 90, 90d supply, fill #1

## 2022-11-15 ENCOUNTER — Other Ambulatory Visit (HOSPITAL_COMMUNITY): Payer: Self-pay

## 2022-12-02 ENCOUNTER — Other Ambulatory Visit: Payer: Self-pay

## 2022-12-02 ENCOUNTER — Other Ambulatory Visit (HOSPITAL_COMMUNITY): Payer: Self-pay

## 2022-12-05 ENCOUNTER — Other Ambulatory Visit (HOSPITAL_COMMUNITY): Payer: Self-pay

## 2022-12-05 MED ORDER — WEGOVY 0.25 MG/0.5ML ~~LOC~~ SOAJ
0.2500 mg | SUBCUTANEOUS | 0 refills | Status: DC
Start: 2022-12-05 — End: 2023-12-05
  Filled 2022-12-05 – 2023-01-19 (×4): qty 2, 28d supply, fill #0

## 2022-12-06 ENCOUNTER — Other Ambulatory Visit (HOSPITAL_COMMUNITY): Payer: Self-pay

## 2022-12-12 ENCOUNTER — Other Ambulatory Visit (HOSPITAL_COMMUNITY): Payer: Self-pay

## 2022-12-16 ENCOUNTER — Other Ambulatory Visit (HOSPITAL_COMMUNITY): Payer: Self-pay

## 2022-12-16 ENCOUNTER — Other Ambulatory Visit: Payer: Self-pay

## 2022-12-16 MED ORDER — CLONIDINE HCL 0.2 MG PO TABS
0.2000 mg | ORAL_TABLET | Freq: Two times a day (BID) | ORAL | 1 refills | Status: DC
  Filled 2022-12-16: qty 180, 90d supply, fill #0
  Filled 2023-03-17: qty 180, 90d supply, fill #1

## 2022-12-16 MED ORDER — TRAMADOL HCL 50 MG PO TABS
50.0000 mg | ORAL_TABLET | Freq: Four times a day (QID) | ORAL | 0 refills | Status: DC | PRN
  Filled 2022-12-16: qty 120, 30d supply, fill #0

## 2022-12-19 ENCOUNTER — Other Ambulatory Visit: Payer: Self-pay

## 2022-12-19 ENCOUNTER — Other Ambulatory Visit (HOSPITAL_COMMUNITY): Payer: Self-pay

## 2023-01-05 ENCOUNTER — Other Ambulatory Visit: Payer: Self-pay

## 2023-01-06 ENCOUNTER — Other Ambulatory Visit (HOSPITAL_BASED_OUTPATIENT_CLINIC_OR_DEPARTMENT_OTHER): Payer: Self-pay

## 2023-01-06 ENCOUNTER — Other Ambulatory Visit: Payer: Self-pay

## 2023-01-06 ENCOUNTER — Other Ambulatory Visit (HOSPITAL_COMMUNITY): Payer: Self-pay

## 2023-01-06 MED ORDER — PRAVASTATIN SODIUM 20 MG PO TABS
20.0000 mg | ORAL_TABLET | Freq: Every evening | ORAL | 0 refills | Status: DC
  Filled 2023-01-06: qty 90, 90d supply, fill #0

## 2023-01-09 ENCOUNTER — Other Ambulatory Visit (HOSPITAL_COMMUNITY): Payer: Self-pay

## 2023-01-16 ENCOUNTER — Other Ambulatory Visit (HOSPITAL_COMMUNITY): Payer: Self-pay

## 2023-01-16 ENCOUNTER — Other Ambulatory Visit: Payer: Self-pay

## 2023-01-18 ENCOUNTER — Other Ambulatory Visit (HOSPITAL_COMMUNITY): Payer: Self-pay

## 2023-01-19 ENCOUNTER — Other Ambulatory Visit (HOSPITAL_COMMUNITY): Payer: Self-pay

## 2023-01-20 ENCOUNTER — Other Ambulatory Visit (HOSPITAL_COMMUNITY): Payer: Self-pay

## 2023-01-20 MED ORDER — FLUTICASONE PROPIONATE 50 MCG/ACT NA SUSP
2.0000 | Freq: Every day | NASAL | 0 refills | Status: DC
  Filled 2023-01-20: qty 48, 90d supply, fill #0

## 2023-01-20 MED ORDER — PANTOPRAZOLE SODIUM 40 MG PO TBEC
40.0000 mg | DELAYED_RELEASE_TABLET | Freq: Every day | ORAL | 0 refills | Status: DC
  Filled 2023-01-20: qty 90, 90d supply, fill #0

## 2023-01-20 MED ORDER — ZOLPIDEM TARTRATE ER 6.25 MG PO TBCR
6.2500 mg | EXTENDED_RELEASE_TABLET | Freq: Every evening | ORAL | 0 refills | Status: DC | PRN
  Filled 2023-01-20: qty 30, 30d supply, fill #0

## 2023-01-30 ENCOUNTER — Other Ambulatory Visit: Payer: Self-pay

## 2023-01-30 ENCOUNTER — Other Ambulatory Visit (HOSPITAL_COMMUNITY): Payer: Self-pay

## 2023-01-30 MED ORDER — TRAMADOL HCL 50 MG PO TABS
50.0000 mg | ORAL_TABLET | Freq: Four times a day (QID) | ORAL | 0 refills | Status: DC | PRN
  Filled 2023-01-30: qty 120, 30d supply, fill #0

## 2023-02-04 ENCOUNTER — Emergency Department (HOSPITAL_BASED_OUTPATIENT_CLINIC_OR_DEPARTMENT_OTHER)
Admission: EM | Admit: 2023-02-04 | Discharge: 2023-02-04 | Disposition: A | Discharge status: Home / Self Care | Payer: Commercial Managed Care - PPO | Attending: Emergency Medicine | Admitting: Emergency Medicine

## 2023-02-04 ENCOUNTER — Other Ambulatory Visit (HOSPITAL_COMMUNITY): Payer: Self-pay

## 2023-02-04 ENCOUNTER — Emergency Department (HOSPITAL_BASED_OUTPATIENT_CLINIC_OR_DEPARTMENT_OTHER): Payer: Commercial Managed Care - PPO

## 2023-02-04 ENCOUNTER — Encounter (HOSPITAL_BASED_OUTPATIENT_CLINIC_OR_DEPARTMENT_OTHER): Payer: Self-pay | Admitting: Emergency Medicine

## 2023-02-04 DIAGNOSIS — Z20822 Contact with and (suspected) exposure to covid-19: Secondary | ICD-10-CM | POA: Diagnosis not present

## 2023-02-04 DIAGNOSIS — I10 Essential (primary) hypertension: Secondary | ICD-10-CM | POA: Insufficient documentation

## 2023-02-04 DIAGNOSIS — J069 Acute upper respiratory infection, unspecified: Secondary | ICD-10-CM | POA: Insufficient documentation

## 2023-02-04 DIAGNOSIS — B9789 Other viral agents as the cause of diseases classified elsewhere: Secondary | ICD-10-CM | POA: Diagnosis not present

## 2023-02-04 DIAGNOSIS — R059 Cough, unspecified: Secondary | ICD-10-CM | POA: Diagnosis not present

## 2023-02-04 DIAGNOSIS — E876 Hypokalemia: Secondary | ICD-10-CM | POA: Diagnosis not present

## 2023-02-04 DIAGNOSIS — Z79899 Other long term (current) drug therapy: Secondary | ICD-10-CM | POA: Diagnosis not present

## 2023-02-04 DIAGNOSIS — R0602 Shortness of breath: Secondary | ICD-10-CM | POA: Diagnosis not present

## 2023-02-04 LAB — BASIC METABOLIC PANEL
Anion gap: 9 (ref 5–15)
BUN: 10 mg/dL (ref 8–23)
CO2: 32 mmol/L (ref 22–32)
Calcium: 9.4 mg/dL (ref 8.9–10.3)
Chloride: 98 mmol/L (ref 98–111)
Creatinine, Ser: 0.75 mg/dL (ref 0.44–1.00)
GFR, Estimated: >60 mL/min (ref >60)
Glucose, Bld: 121 mg/dL — ABNORMAL HIGH (ref 70–99)
Potassium: 3 mmol/L — ABNORMAL LOW (ref 3.5–5.1)
Sodium: 139 mmol/L (ref 135–145)

## 2023-02-04 LAB — RESP PANEL BY RT-PCR (RSV, FLU A&B, COVID)  RVPGX2
Influenza A by PCR: NEGATIVE
Influenza B by PCR: NEGATIVE
Resp Syncytial Virus by PCR: NEGATIVE
SARS Coronavirus 2 by RT PCR: NEGATIVE

## 2023-02-04 LAB — TROPONIN I (HIGH SENSITIVITY): Troponin I (High Sensitivity): 8 ng/L (ref <18)

## 2023-02-04 LAB — CBC
HCT: 44.4 % (ref 36.0–46.0)
Hemoglobin: 14.3 g/dL (ref 12.0–15.0)
MCH: 25 pg — ABNORMAL LOW (ref 26.0–34.0)
MCHC: 32.2 g/dL (ref 30.0–36.0)
MCV: 77.8 fL — ABNORMAL LOW (ref 80.0–100.0)
Platelets: 299 10*3/uL (ref 150–400)
RBC: 5.71 MIL/uL — ABNORMAL HIGH (ref 3.87–5.11)
RDW: 17 % — ABNORMAL HIGH (ref 11.5–15.5)
WBC: 6.5 10*3/uL (ref 4.0–10.5)
nRBC: 0 % (ref 0.0–0.2)

## 2023-02-04 LAB — BRAIN NATRIURETIC PEPTIDE: B Natriuretic Peptide: 43.1 pg/mL (ref 0.0–100.0)

## 2023-02-04 MED ORDER — POTASSIUM CHLORIDE ER 10 MEQ PO TBCR
10.0000 meq | EXTENDED_RELEASE_TABLET | Freq: Every day | ORAL | 0 refills | Status: DC
  Filled 2023-02-04: qty 15, 15d supply, fill #0

## 2023-02-04 MED ORDER — ALBUTEROL SULFATE HFA 108 (90 BASE) MCG/ACT IN AERS: 2.0000 | INHALATION_SPRAY | RESPIRATORY_TRACT | Status: DC | PRN

## 2023-02-04 MED ORDER — GUAIFENESIN 100 MG/5ML PO LIQD
5.0000 mL | Freq: Once | ORAL | Status: AC
  Administered 2023-02-04: 5 mL via ORAL
  Filled 2023-02-04: qty 10

## 2023-02-04 MED ORDER — POTASSIUM CHLORIDE CRYS ER 20 MEQ PO TBCR
40.0000 meq | EXTENDED_RELEASE_TABLET | Freq: Once | ORAL | Status: AC
  Administered 2023-02-04: 40 meq via ORAL
  Filled 2023-02-04: qty 2

## 2023-02-04 NOTE — ED Notes (Signed)
Unsuccessful straight stick attempt x1, charge nurse will attempt blood work

## 2023-02-04 NOTE — ED Provider Notes (Signed)
[place] [facility] Provider Note   CSN: [id number] Arrival date & time: 02/04/23  4098     History  Chief Complaint  Patient presents with   Cough    Ashley Warren is a 68 y.o. female with history of obesity, high blood pressure, presented to the ED with fevers, chills, cough, congestion, headache, lightheadedness.  Patient reports onset of symptoms last night.  She reports she has been taking over-the-counter cough medications persistently all night because she feels very congested.  Denies history of COPD, smoking or underlying lung condition.  Denies history of diabetes.  She reports that she does have high blood pressure and took her usual medications yesterday.  HPI     Home Medications Prior to Admission medications   Medication Sig Start Date End Date Taking? Authorizing Provider  potassium chloride (KLOR-CON) 10 MEQ tablet Take 1 tablet (10 mEq total) by mouth daily for 15 days. 02/04/23 02/19/23 Yes Makynlee Kressin, Kermit Balo, MD  albuterol (PROVENTIL HFA;VENTOLIN HFA) 108 (90 BASE) MCG/ACT inhaler Inhale 2 puffs into the lungs every 4 (four) hours as needed for wheezing or shortness of breath. 09/30/13   Ozella Rocks, MD  albuterol (VENTOLIN HFA) 108 (90 Base) MCG/ACT inhaler Inhale 2 puffs into the lungs every 6 (six) hours as needed. 06/15/20     amLODipine (NORVASC) 10 MG tablet Take 1 tablet (10 mg total) by mouth daily. 10/31/22     Ascorbic Acid (VITAMIN C CR) 500 MG CPCR Take 500 mg by mouth daily.    [provider]  azelastine (OPTIVAR) 0.05 % ophthalmic solution Place 1 drop into both eyes 2 (two) times daily. 12/13/21     B Complex Vitamins (B COMPLEX PO) Take 1 tablet by mouth daily.    [provider]  BACILLUS COAGULANS-INULIN PO Take 2 tablets by mouth daily.    [provider]  Black Cohosh 160 MG CAPS Take 160 mg by mouth daily.    [provider]  Boswellia Serrata (BOSWELLIA PO) Take 375  mg by mouth daily.    [provider]  budesonide-formoterol (SYMBICORT) 160-4.5 MCG/ACT inhaler Inhale 2 puffs into the lungs 2 (two) times daily.    [provider]  calcium carbonate (OS-CAL) 1250 (500 Ca) MG chewable tablet Chew 1,250 mg by mouth daily.    [provider]  celecoxib (CELEBREX) 200 MG capsule Take 1 capsule by mouth daily 06/11/20     cloNIDine (CATAPRES) 0.2 MG tablet TAKE 1 TABLET BY MOUTH 2 TIMES DAILY Patient taking differently: Take 0.2 mg by mouth 2 (two) times daily. 06/18/19 06/17/20  Iona Hansen, NP  cloNIDine (CATAPRES) 0.2 MG tablet Take 1 tablet (0.2 mg total) by mouth 2 (two) times daily. 12/16/22     diclofenac Sodium (VOLTAREN) 1 % GEL APPLY 4 GRAMS 3 TIMES DAILY TO AFFECTED AREA(S) 01/11/22     ergocalciferol (VITAMIN D2) 1.25 MG (50000 UT) capsule Take 50,000 Units by mouth 2 (two) times a week. No specific days    [provider]  fluticasone (FLONASE) 50 MCG/ACT nasal spray Administer 2 sprays in each nostril daily. 01/20/23     gabapentin (NEURONTIN) 100 MG capsule Take 1 capsule by mouth at bedtime for 1 week, then increase to 2 capsules for 1 week, then increase to 3 capsules if tolerated 10/27/21     gabapentin (NEURONTIN) 100 MG capsule Take 1 capsule (100 mg total) by mouth at bedtime for 7 days, THEN 2  capsules (200 mg total) at bedtime for 7 days, THEN 3 capsules (300 mg total) at bedtime if tolerated 12/15/21 01/21/22    hydrochlorothiazide (HYDRODIURIL) 25 MG tablet Take 1 tablet (25 mg total) by mouth daily. 11/14/22     ibuprofen (ADVIL) 200 MG tablet Take 4 tablets by mouth 2  times daily as needed. 05/21/20   Lewie Chamber, MD  lisinopril (ZESTRIL) 40 MG tablet Take 1 tablet (40 mg total) by mouth daily. 05/21/20   Lewie Chamber, MD  lisinopril (ZESTRIL) 40 MG tablet Take 1 tablet (40 mg) by mouth 2 times daily. 08/22/22     Milk Thistle 150 MG CAPS Take 150 mg by mouth daily.    [provider]  mupirocin nasal  ointment (BACTROBAN) 2 % Place 1 application into the nose 2 (two) times daily as needed (nasal itching). Use one-half of tube in each nostril twice daily for five (5) days. After application, press sides of nose together and gently massage.    [provider]  nirmatrelvir & ritonavir (PAXLOVID, 300/100,) 20 x 150 MG & 10 x 100MG  TBPK Take 300 mg nirmatrelvir (two 150 mg tablets) with 100 mg ritonavir (one 100 mg tablet) by mouth twice daily for 5 days 02/09/22     nystatin (MYCOSTATIN/NYSTOP) powder USE TWICE A DAY FOR 10 DAYS THEN AS NEEDED 10/27/21     OVER THE COUNTER MEDICATION Take 2 capsules by mouth daily. FISH OIL OMEGA 3 VIT C VIT E 2000-650-12MG     [provider]  pantoprazole (PROTONIX) 40 MG tablet Take 1 tablet (40 mg total) by mouth daily. 01/20/23     phentermine (ADIPEX-P) 37.5 MG tablet Take 1 tablet by mouth daily before breakfast. 11/03/20     polyvinyl alcohol (LIQUIFILM TEARS) 1.4 % ophthalmic solution Place 1 drop into both eyes daily.    [provider]  potassium chloride (KLOR-CON) 10 MEQ tablet Take 1 tablet (10 mEq total) by mouth daily for 5 days. 11/09/20 11/14/20  Hyman Hopes, Margaux, PA-C  potassium chloride (KLOR-CON) 10 MEQ tablet Take 1 tablet (10 mEq total) by mouth daily for 5 days. 01/20/21     potassium chloride SA (KLOR-CON M) 20 MEQ tablet Take 2 tablets (40 mEq total) by mouth daily. 08/03/21     potassium chloride SA (KLOR-CON) 20 MEQ tablet TAKE 2 TABLETS BY MOUTH DAILY 05/11/20     pravastatin (PRAVACHOL) 20 MG tablet TAKE 1 TABLET BY MOUTH EVERY EVENING Patient taking differently: Take 20 mg by mouth every evening. 01/16/20 01/15/21  Iona Hansen, NP  pravastatin (PRAVACHOL) 20 MG tablet Take 1 tablet (20 mg total) by mouth nightly. 01/06/23     Semaglutide,0.25 or 0.5MG /DOS, (OZEMPIC, 0.25 OR 0.5 MG/DOSE,) 2 MG/3ML SOPN Inject 0.25mg  into the skin once a week for 30 days 03/28/21     Semaglutide-Weight Management (WEGOVY) 0.25 MG/0.5ML SOAJ  Inject 0.25 mg into the skin every 7 days. 06/29/21     Semaglutide-Weight Management (WEGOVY) 0.25 MG/0.5ML SOAJ Inject 0.25 mg into the skin every 7 (seven) days. 09/28/22     Semaglutide-Weight Management (WEGOVY) 0.25 MG/0.5ML SOAJ Inject 0.25 mg into the skin every 7 days 12/05/22     Spacer/Aero-Holding Chambers DEVI Use as directed 09/30/13   Ozella Rocks, MD  traMADol (ULTRAM) 50 MG tablet Take 1 tablet by mouth every 8 hours as needed for pain. 02/01/21     traMADol (ULTRAM) 50 MG tablet Take 1 tablet (50 mg total) by mouth every 8 (eight) hours  as needed for moderate pain (4-6). 09/19/22     traMADol (ULTRAM) 50 MG tablet Take 1 tablet (50 mg total) by mouth every 6 (six) hours as needed for moderate pain (4-6). 10/18/22     traMADol (ULTRAM) 50 MG tablet Take 1 tablet (50 mg total) by mouth every 6 (six) hours as needed for pain 01/30/23     triamcinolone cream (KENALOG) 0.1 % Apply to the affected area(s) as needed 10/27/21     vitamin C (ASCORBIC ACID) 250 MG tablet Take 250 mg by mouth daily.    [provider]  zolpidem (AMBIEN CR) 6.25 MG CR tablet Take 1 tablet (6.25 mg total) by mouth at bedtime as needed for sleep 12/15/21     zolpidem (AMBIEN CR) 6.25 MG CR tablet Take 1 tablet (6.25 mg total) by mouth at bedtime as needed for sleep. STOP Zolpidem 5mg . 01/20/23     zolpidem (AMBIEN) 5 MG tablet Take 1 tablet (5 mg total) by mouth at bedtime as needed for sleep. 09/23/21         Allergies    Penicillins    Review of Systems   Review of Systems  Physical Exam Updated Vital Signs BP (!) 162/68 (BP Location: Right Wrist)   Pulse 94   Temp 98.5 F (36.9 C)   Resp (!) 23   Ht 5\' 6"  (1.676 m)   Wt (!) 160.6 kg   SpO2 100%   BMI 57.15 kg/m  Physical Exam Constitutional:      General: She is not in acute distress.    Appearance: She is obese.  HENT:     Head: Normocephalic and atraumatic.  Eyes:     Conjunctiva/sclera: Conjunctivae normal.     Pupils: Pupils are  equal, round, and reactive to light.  Cardiovascular:     Rate and Rhythm: Normal rate and regular rhythm.  Pulmonary:     Effort: Pulmonary effort is normal. No respiratory distress.     Comments: Distant breath sounds bilaterally Abdominal:     General: There is no distension.     Tenderness: There is no abdominal tenderness.  Skin:    General: Skin is warm and dry.  Neurological:     General: No focal deficit present.     Mental Status: She is alert. Mental status is at baseline.  Psychiatric:        Mood and Affect: Mood normal.        Behavior: Behavior normal.     ED Results / Procedures / Treatments   Labs (all labs ordered are listed, but only abnormal results are displayed) Labs Reviewed  BASIC METABOLIC PANEL - Abnormal; Notable for the following components:      Result Value   Potassium 3.0 (*)    Glucose, Bld 121 (*)    All other components within normal limits  CBC - Abnormal; Notable for the following components:   RBC 5.71 (*)    MCV 77.8 (*)    MCH 25.0 (*)    RDW 17.0 (*)    All other components within normal limits  RESP PANEL BY RT-PCR (RSV, FLU A&B, COVID)  RVPGX2  BRAIN NATRIURETIC PEPTIDE  TROPONIN I (HIGH SENSITIVITY)    EKG EKG Interpretation Date/Time:  Saturday February 04 2023 07:19:37 EST Ventricular Rate:  98 PR Interval:  213 QRS Duration:  111 QT Interval:  391 QTC Calculation: 500 R Axis:   -24  Text Interpretation: Sinus rhythm Borderline prolonged PR interval Incomplete RBBB  and LAFB R Borderline prolonged QT interval Confirmed by Alvester Chou [phone]) on 02/04/2023 7:32:48 AM  Radiology DG Chest 2 View Result Date: 02/04/2023 CLINICAL DATA:  Shortness of breath.  Cough . EXAM: CHEST - 2 VIEW COMPARISON:  02/10/2022 FINDINGS: The lungs are clear without focal pneumonia, edema, pneumothorax or pleural effusion. The cardiopericardial silhouette is within normal limits for size. No acute bony abnormality. Telemetry leads overlie the  chest. IMPRESSION: No active cardiopulmonary disease. Electronically Signed   By: Kennith Center M.D.   On: 02/04/2023 08:21    Procedures Procedures    Medications Ordered in ED Medications  albuterol (VENTOLIN HFA) 108 (90 Base) MCG/ACT inhaler 2 puff (has no administration in time range)  potassium chloride SA (KLOR-CON M) CR tablet 40 mEq (has no administration in time range)  guaiFENesin (ROBITUSSIN) 100 MG/5ML liquid 5 mL (5 mLs Oral Given 02/04/23 6045)    ED Course/ Medical Decision Making/ A&P                                 Medical Decision Making Amount and/or Complexity of Data Reviewed Labs: ordered. Radiology: ordered.  Risk OTC drugs. Prescription drug management.   Patient is here with constellation of symptoms suggestive of viral syndrome most likely.  We will check a flu and COVID test.  X-ray of the chest ordered to evaluate for underlying pneumonia as I am having a difficult time auscultating the patient, likely due to her large habitus.  I do not hear audible wheezing nor does she have a history of smoking or asthma to suggest reactive airway disease.  She was tachycardic which may be due to acute viral illness.  Her blood pressure was also quite elevated, which may also be due to illness and recurrent use of over-the-counter decongestants.  I do not see evidence of acute heart failure at this time.  Lower suspicion for acute PE in this clinical setting.  No indication for CT angiogram imaging.  I personally reviewed and interpreted the patient's labs and x-ray imaging.  No emergent findings.  She has a mild hypokalemia and we will restart her on oral potassium here in the ED.  But I do not see evidence of atypical ACS, congestive heart failure, bacterial pneumonia, sepsis.  Low suspicion for acute PE.  I suspect this is likely a viral URI, and discussed conservative measures.  The patient is in agreement.  Blood pressure has somewhat improved, suspect initial  triage pressure may have been an inaccurate reading, but I also suspect some of her elevated blood pressure was due to to over the counter decongestants.  She can follow-up with her PCP for this issue.  Stable for discharge        Final Clinical Impression(s) / ED Diagnoses Final diagnoses:  Viral URI  Hypokalemia  Hypertension, unspecified type    Rx / DC Orders ED Discharge Orders          Ordered    potassium chloride (KLOR-CON) 10 MEQ tablet  Daily        02/04/23 0942              Terald Sleeper, MD 02/04/23 [phone]

## 2023-02-04 NOTE — ED Triage Notes (Signed)
URI since yesterday with SOB, chills, cough, pounding headache with heart racing. Has been trying breathing exercises and home meds.with no relief. Denies exposure.

## 2023-02-06 ENCOUNTER — Other Ambulatory Visit (HOSPITAL_COMMUNITY): Payer: Self-pay

## 2023-02-08 ENCOUNTER — Other Ambulatory Visit (HOSPITAL_BASED_OUTPATIENT_CLINIC_OR_DEPARTMENT_OTHER): Payer: Self-pay

## 2023-02-08 ENCOUNTER — Other Ambulatory Visit (HOSPITAL_COMMUNITY): Payer: Self-pay

## 2023-02-08 DIAGNOSIS — J069 Acute upper respiratory infection, unspecified: Secondary | ICD-10-CM | POA: Diagnosis not present

## 2023-02-08 DIAGNOSIS — R051 Acute cough: Secondary | ICD-10-CM | POA: Diagnosis not present

## 2023-02-08 MED ORDER — ALBUTEROL SULFATE HFA 108 (90 BASE) MCG/ACT IN AERS
INHALATION_SPRAY | RESPIRATORY_TRACT | 1 refills | Status: DC
  Filled 2023-02-08: qty 6.7, 25d supply, fill #0
  Filled 2023-02-08: qty 6.7, 30d supply, fill #0
  Filled 2023-11-02: qty 6.7, 30d supply, fill #1

## 2023-02-08 MED ORDER — HYDROCOD POLI-CHLORPHE POLI ER 10-8 MG/5ML PO SUER
5.0000 mL | Freq: Two times a day (BID) | ORAL | 0 refills | Status: DC | PRN
  Filled 2023-02-08 (×2): qty 50, 5d supply, fill #0

## 2023-02-09 ENCOUNTER — Other Ambulatory Visit (HOSPITAL_BASED_OUTPATIENT_CLINIC_OR_DEPARTMENT_OTHER): Payer: Self-pay

## 2023-02-14 ENCOUNTER — Other Ambulatory Visit (HOSPITAL_COMMUNITY): Payer: Self-pay

## 2023-02-14 ENCOUNTER — Other Ambulatory Visit: Payer: Self-pay

## 2023-02-14 DIAGNOSIS — H04123 Dry eye syndrome of bilateral lacrimal glands: Secondary | ICD-10-CM | POA: Diagnosis not present

## 2023-02-14 DIAGNOSIS — H40023 Open angle with borderline findings, high risk, bilateral: Secondary | ICD-10-CM | POA: Diagnosis not present

## 2023-02-14 DIAGNOSIS — H40053 Ocular hypertension, bilateral: Secondary | ICD-10-CM | POA: Diagnosis not present

## 2023-02-14 DIAGNOSIS — H40033 Anatomical narrow angle, bilateral: Secondary | ICD-10-CM | POA: Diagnosis not present

## 2023-02-14 MED ORDER — LOTEMAX SM 0.38 % OP GEL
1.0000 [drp] | Freq: Two times a day (BID) | OPHTHALMIC | 1 refills | Status: AC
  Filled 2023-02-14: qty 5, 14d supply, fill #0

## 2023-02-15 ENCOUNTER — Other Ambulatory Visit (HOSPITAL_COMMUNITY): Payer: Self-pay

## 2023-02-15 MED ORDER — LISINOPRIL 40 MG PO TABS
40.0000 mg | ORAL_TABLET | Freq: Two times a day (BID) | ORAL | 1 refills | Status: DC
  Filled 2023-02-15: qty 180, 90d supply, fill #0
  Filled 2023-05-18: qty 180, 90d supply, fill #1

## 2023-02-16 ENCOUNTER — Other Ambulatory Visit (HOSPITAL_COMMUNITY): Payer: Self-pay

## 2023-03-07 ENCOUNTER — Other Ambulatory Visit (HOSPITAL_COMMUNITY): Payer: Self-pay

## 2023-03-07 DIAGNOSIS — H40033 Anatomical narrow angle, bilateral: Secondary | ICD-10-CM | POA: Diagnosis not present

## 2023-03-07 MED ORDER — PREDNISOLONE ACETATE 1 % OP SUSP
1.0000 [drp] | Freq: Four times a day (QID) | OPHTHALMIC | 0 refills | Status: AC
Start: 2023-03-07 — End: ?
  Filled 2023-03-07 (×2): qty 10, 50d supply, fill #0

## 2023-03-08 ENCOUNTER — Other Ambulatory Visit (HOSPITAL_COMMUNITY): Payer: Self-pay

## 2023-03-08 DIAGNOSIS — Z6841 Body Mass Index (BMI) 40.0 and over, adult: Secondary | ICD-10-CM | POA: Diagnosis not present

## 2023-03-08 DIAGNOSIS — R062 Wheezing: Secondary | ICD-10-CM | POA: Diagnosis not present

## 2023-03-08 DIAGNOSIS — R059 Cough, unspecified: Secondary | ICD-10-CM | POA: Diagnosis not present

## 2023-03-08 DIAGNOSIS — J069 Acute upper respiratory infection, unspecified: Secondary | ICD-10-CM | POA: Diagnosis not present

## 2023-03-08 DIAGNOSIS — I10 Essential (primary) hypertension: Secondary | ICD-10-CM | POA: Diagnosis not present

## 2023-03-08 MED ORDER — ALBUTEROL SULFATE HFA 108 (90 BASE) MCG/ACT IN AERS
2.0000 | INHALATION_SPRAY | Freq: Four times a day (QID) | RESPIRATORY_TRACT | 1 refills | Status: AC | PRN
  Filled 2023-03-08: qty 6.7, 25d supply, fill #0
  Filled 2023-09-18: qty 6.7, 25d supply, fill #1

## 2023-03-08 MED ORDER — ALBUTEROL SULFATE (2.5 MG/3ML) 0.083% IN NEBU
2.5000 mg | INHALATION_SOLUTION | Freq: Three times a day (TID) | RESPIRATORY_TRACT | 0 refills | Status: AC | PRN
  Filled 2023-03-08: qty 180, 20d supply, fill #0

## 2023-03-08 MED ORDER — DOXYCYCLINE MONOHYDRATE 100 MG PO CAPS
100.0000 mg | ORAL_CAPSULE | Freq: Two times a day (BID) | ORAL | 0 refills | Status: AC
Start: 2023-03-08 — End: 2023-03-19
  Filled 2023-03-08: qty 20, 10d supply, fill #0

## 2023-03-08 MED ORDER — PREDNISONE 10 MG PO TABS
ORAL_TABLET | ORAL | 0 refills | Status: AC
  Filled 2023-03-08: qty 18, 9d supply, fill #0

## 2023-03-10 ENCOUNTER — Other Ambulatory Visit (HOSPITAL_COMMUNITY): Payer: Self-pay

## 2023-03-17 ENCOUNTER — Other Ambulatory Visit: Payer: Self-pay

## 2023-03-17 ENCOUNTER — Other Ambulatory Visit (HOSPITAL_COMMUNITY): Payer: Self-pay

## 2023-03-17 MED ORDER — TRAMADOL HCL 50 MG PO TABS
50.0000 mg | ORAL_TABLET | Freq: Four times a day (QID) | ORAL | 0 refills | Status: DC | PRN
Start: 2023-03-17 — End: 2023-05-09
  Filled 2023-03-17: qty 120, 30d supply, fill #0

## 2023-03-21 ENCOUNTER — Other Ambulatory Visit (HOSPITAL_COMMUNITY): Payer: Self-pay

## 2023-03-22 ENCOUNTER — Other Ambulatory Visit (HOSPITAL_COMMUNITY): Payer: Self-pay

## 2023-03-22 ENCOUNTER — Other Ambulatory Visit: Payer: Self-pay

## 2023-03-22 MED ORDER — NYSTATIN 100000 UNIT/GM EX POWD
Freq: Two times a day (BID) | CUTANEOUS | 1 refills | Status: DC
Start: 2023-03-22 — End: 2023-08-28
  Filled 2023-03-22: qty 60, 30d supply, fill #0
  Filled 2023-08-16: qty 60, 30d supply, fill #1

## 2023-03-27 ENCOUNTER — Other Ambulatory Visit (HOSPITAL_COMMUNITY): Payer: Self-pay

## 2023-03-27 ENCOUNTER — Other Ambulatory Visit: Payer: Self-pay

## 2023-03-27 DIAGNOSIS — I10 Essential (primary) hypertension: Secondary | ICD-10-CM | POA: Diagnosis not present

## 2023-03-27 DIAGNOSIS — J4541 Moderate persistent asthma with (acute) exacerbation: Secondary | ICD-10-CM | POA: Diagnosis not present

## 2023-03-27 DIAGNOSIS — F5101 Primary insomnia: Secondary | ICD-10-CM | POA: Diagnosis not present

## 2023-03-27 DIAGNOSIS — R7303 Prediabetes: Secondary | ICD-10-CM | POA: Diagnosis not present

## 2023-03-27 DIAGNOSIS — Z79899 Other long term (current) drug therapy: Secondary | ICD-10-CM | POA: Diagnosis not present

## 2023-03-27 DIAGNOSIS — Z6841 Body Mass Index (BMI) 40.0 and over, adult: Secondary | ICD-10-CM | POA: Diagnosis not present

## 2023-03-27 DIAGNOSIS — J069 Acute upper respiratory infection, unspecified: Secondary | ICD-10-CM | POA: Diagnosis not present

## 2023-03-27 MED ORDER — BUDESONIDE-FORMOTEROL FUMARATE 160-4.5 MCG/ACT IN AERO
2.0000 | INHALATION_SPRAY | Freq: Two times a day (BID) | RESPIRATORY_TRACT | 1 refills | Status: AC
Start: 2023-03-27 — End: ?
  Filled 2023-03-27: qty 30.6, 90d supply, fill #0

## 2023-03-27 MED ORDER — TRAZODONE HCL 50 MG PO TABS
50.0000 mg | ORAL_TABLET | Freq: Every day | ORAL | 3 refills | Status: DC
  Filled 2023-03-27: qty 30, 30d supply, fill #0
  Filled 2023-07-02: qty 30, 30d supply, fill #1
  Filled 2023-08-23 (×2): qty 30, 30d supply, fill #2
  Filled 2023-11-27: qty 30, 30d supply, fill #3

## 2023-04-04 DIAGNOSIS — H40033 Anatomical narrow angle, bilateral: Secondary | ICD-10-CM | POA: Diagnosis not present

## 2023-04-06 ENCOUNTER — Other Ambulatory Visit (HOSPITAL_COMMUNITY): Payer: Self-pay

## 2023-04-07 ENCOUNTER — Other Ambulatory Visit (HOSPITAL_COMMUNITY): Payer: Self-pay

## 2023-04-07 MED ORDER — PRAVASTATIN SODIUM 20 MG PO TABS
20.0000 mg | ORAL_TABLET | Freq: Every evening | ORAL | 0 refills | Status: DC
  Filled 2023-04-07: qty 90, 90d supply, fill #0

## 2023-04-17 ENCOUNTER — Other Ambulatory Visit (HOSPITAL_COMMUNITY): Payer: Self-pay

## 2023-04-17 MED ORDER — WEGOVY 0.25 MG/0.5ML ~~LOC~~ SOAJ
0.2500 mg | SUBCUTANEOUS | 0 refills | Status: DC
  Filled 2023-04-17 – 2023-04-18 (×3): qty 2, 28d supply, fill #0

## 2023-04-18 ENCOUNTER — Other Ambulatory Visit: Payer: Self-pay

## 2023-04-18 ENCOUNTER — Other Ambulatory Visit (HOSPITAL_COMMUNITY): Payer: Self-pay

## 2023-04-19 ENCOUNTER — Other Ambulatory Visit (HOSPITAL_COMMUNITY): Payer: Self-pay

## 2023-04-21 ENCOUNTER — Other Ambulatory Visit (HOSPITAL_COMMUNITY): Payer: Self-pay

## 2023-04-21 ENCOUNTER — Other Ambulatory Visit (HOSPITAL_COMMUNITY): Payer: Self-pay | Admitting: Emergency Medicine

## 2023-04-21 MED ORDER — PANTOPRAZOLE SODIUM 40 MG PO TBEC
40.0000 mg | DELAYED_RELEASE_TABLET | Freq: Every day | ORAL | 0 refills | Status: DC
Start: 2023-04-21 — End: 2023-07-25
  Filled 2023-04-21: qty 90, 90d supply, fill #0

## 2023-04-21 MED ORDER — AMLODIPINE BESYLATE 10 MG PO TABS
10.0000 mg | ORAL_TABLET | Freq: Every day | ORAL | 1 refills | Status: DC
Start: 2023-04-21 — End: 2023-10-30
  Filled 2023-04-21: qty 90, 90d supply, fill #0
  Filled 2023-07-25: qty 90, 90d supply, fill #1

## 2023-04-22 ENCOUNTER — Other Ambulatory Visit (HOSPITAL_COMMUNITY): Payer: Self-pay

## 2023-04-22 ENCOUNTER — Other Ambulatory Visit (HOSPITAL_BASED_OUTPATIENT_CLINIC_OR_DEPARTMENT_OTHER): Payer: Self-pay

## 2023-04-25 ENCOUNTER — Other Ambulatory Visit (HOSPITAL_COMMUNITY): Payer: Self-pay

## 2023-04-29 ENCOUNTER — Other Ambulatory Visit (HOSPITAL_COMMUNITY): Payer: Self-pay

## 2023-04-29 ENCOUNTER — Other Ambulatory Visit (HOSPITAL_BASED_OUTPATIENT_CLINIC_OR_DEPARTMENT_OTHER): Payer: Self-pay

## 2023-04-29 MED ORDER — POTASSIUM CHLORIDE ER 10 MEQ PO TBCR
10.0000 meq | EXTENDED_RELEASE_TABLET | Freq: Every day | ORAL | 0 refills | Status: DC
  Filled 2023-04-29: qty 15, 15d supply, fill #0

## 2023-05-08 ENCOUNTER — Other Ambulatory Visit (HOSPITAL_COMMUNITY): Payer: Self-pay

## 2023-05-09 ENCOUNTER — Encounter: Payer: Self-pay | Admitting: Pharmacist

## 2023-05-09 ENCOUNTER — Other Ambulatory Visit (HOSPITAL_COMMUNITY): Payer: Self-pay

## 2023-05-09 ENCOUNTER — Other Ambulatory Visit: Payer: Self-pay

## 2023-05-09 MED ORDER — TRAMADOL HCL 50 MG PO TABS
50.0000 mg | ORAL_TABLET | Freq: Four times a day (QID) | ORAL | 0 refills | Status: DC
  Filled 2023-05-09 – 2023-05-18 (×2): qty 120, 30d supply, fill #0

## 2023-05-10 DIAGNOSIS — H40033 Anatomical narrow angle, bilateral: Secondary | ICD-10-CM | POA: Diagnosis not present

## 2023-05-10 DIAGNOSIS — H04123 Dry eye syndrome of bilateral lacrimal glands: Secondary | ICD-10-CM | POA: Diagnosis not present

## 2023-05-10 DIAGNOSIS — H40023 Open angle with borderline findings, high risk, bilateral: Secondary | ICD-10-CM | POA: Diagnosis not present

## 2023-05-11 ENCOUNTER — Encounter (HOSPITAL_COMMUNITY): Payer: Self-pay

## 2023-05-11 ENCOUNTER — Other Ambulatory Visit (HOSPITAL_COMMUNITY): Payer: Self-pay

## 2023-05-12 ENCOUNTER — Other Ambulatory Visit: Payer: Self-pay

## 2023-05-17 ENCOUNTER — Other Ambulatory Visit (HOSPITAL_COMMUNITY): Payer: Self-pay

## 2023-05-18 ENCOUNTER — Other Ambulatory Visit: Payer: Self-pay

## 2023-05-18 ENCOUNTER — Other Ambulatory Visit (HOSPITAL_COMMUNITY): Payer: Self-pay

## 2023-05-18 ENCOUNTER — Encounter (HOSPITAL_COMMUNITY): Payer: Self-pay

## 2023-05-22 ENCOUNTER — Other Ambulatory Visit (HOSPITAL_COMMUNITY): Payer: Self-pay | Admitting: Emergency Medicine

## 2023-05-22 ENCOUNTER — Other Ambulatory Visit: Payer: Self-pay

## 2023-05-22 ENCOUNTER — Other Ambulatory Visit (HOSPITAL_COMMUNITY): Payer: Self-pay

## 2023-05-22 MED ORDER — HYDROCHLOROTHIAZIDE 25 MG PO TABS
25.0000 mg | ORAL_TABLET | Freq: Every day | ORAL | 1 refills | Status: DC
  Filled 2023-05-22: qty 90, 90d supply, fill #0

## 2023-05-23 ENCOUNTER — Other Ambulatory Visit (HOSPITAL_COMMUNITY): Payer: Self-pay

## 2023-05-24 ENCOUNTER — Other Ambulatory Visit (HOSPITAL_COMMUNITY): Payer: Self-pay

## 2023-05-31 ENCOUNTER — Emergency Department (HOSPITAL_BASED_OUTPATIENT_CLINIC_OR_DEPARTMENT_OTHER)

## 2023-05-31 ENCOUNTER — Observation Stay (HOSPITAL_BASED_OUTPATIENT_CLINIC_OR_DEPARTMENT_OTHER)
Admission: EM | Admit: 2023-05-31 | Discharge: 2023-06-02 | Disposition: A | Discharge status: Home / Self Care | Attending: Internal Medicine | Admitting: Internal Medicine

## 2023-05-31 ENCOUNTER — Encounter (HOSPITAL_BASED_OUTPATIENT_CLINIC_OR_DEPARTMENT_OTHER): Payer: Self-pay | Admitting: Emergency Medicine

## 2023-05-31 DIAGNOSIS — I5031 Acute diastolic (congestive) heart failure: Secondary | ICD-10-CM

## 2023-05-31 DIAGNOSIS — Z7985 Long-term (current) use of injectable non-insulin antidiabetic drugs: Secondary | ICD-10-CM | POA: Insufficient documentation

## 2023-05-31 DIAGNOSIS — I160 Hypertensive urgency: Principal | ICD-10-CM | POA: Insufficient documentation

## 2023-05-31 DIAGNOSIS — Z6841 Body Mass Index (BMI) 40.0 and over, adult: Secondary | ICD-10-CM | POA: Insufficient documentation

## 2023-05-31 DIAGNOSIS — R000 Tachycardia, unspecified: Secondary | ICD-10-CM | POA: Diagnosis not present

## 2023-05-31 DIAGNOSIS — I110 Hypertensive heart disease with heart failure: Secondary | ICD-10-CM | POA: Diagnosis not present

## 2023-05-31 DIAGNOSIS — R609 Edema, unspecified: Secondary | ICD-10-CM | POA: Diagnosis not present

## 2023-05-31 DIAGNOSIS — I509 Heart failure, unspecified: Secondary | ICD-10-CM | POA: Diagnosis not present

## 2023-05-31 DIAGNOSIS — E876 Hypokalemia: Secondary | ICD-10-CM | POA: Insufficient documentation

## 2023-05-31 DIAGNOSIS — Z79899 Other long term (current) drug therapy: Secondary | ICD-10-CM | POA: Diagnosis not present

## 2023-05-31 DIAGNOSIS — R55 Syncope and collapse: Secondary | ICD-10-CM | POA: Insufficient documentation

## 2023-05-31 DIAGNOSIS — J811 Chronic pulmonary edema: Secondary | ICD-10-CM | POA: Diagnosis not present

## 2023-05-31 DIAGNOSIS — I10 Essential (primary) hypertension: Secondary | ICD-10-CM | POA: Diagnosis not present

## 2023-05-31 DIAGNOSIS — R809 Proteinuria, unspecified: Secondary | ICD-10-CM

## 2023-05-31 DIAGNOSIS — I517 Cardiomegaly: Secondary | ICD-10-CM | POA: Diagnosis not present

## 2023-05-31 LAB — CBC WITH DIFFERENTIAL/PLATELET
Abs Immature Granulocytes: 0.02 10*3/uL (ref 0.00–0.07)
Basophils Absolute: 0.1 10*3/uL (ref 0.0–0.1)
Basophils Relative: 1 %
Eosinophils Absolute: 0.4 10*3/uL (ref 0.0–0.5)
Eosinophils Relative: 5 %
HCT: 41.9 % (ref 36.0–46.0)
Hemoglobin: 13.8 g/dL (ref 12.0–15.0)
Immature Granulocytes: 0 %
Lymphocytes Relative: 40 %
Lymphs Abs: 3.5 10*3/uL (ref 0.7–4.0)
MCH: 25.5 pg — ABNORMAL LOW (ref 26.0–34.0)
MCHC: 32.9 g/dL (ref 30.0–36.0)
MCV: 77.4 fL — ABNORMAL LOW (ref 80.0–100.0)
Monocytes Absolute: 0.8 10*3/uL (ref 0.1–1.0)
Monocytes Relative: 9 %
Neutro Abs: 4 10*3/uL (ref 1.7–7.7)
Neutrophils Relative %: 45 %
Platelets: 282 10*3/uL (ref 150–400)
RBC: 5.41 MIL/uL — ABNORMAL HIGH (ref 3.87–5.11)
RDW: 17.2 % — ABNORMAL HIGH (ref 11.5–15.5)
WBC: 8.8 10*3/uL (ref 4.0–10.5)
nRBC: 0 % (ref 0.0–0.2)

## 2023-05-31 LAB — BASIC METABOLIC PANEL WITH GFR
Anion gap: 11 (ref 5–15)
BUN: 16 mg/dL (ref 8–23)
CO2: 30 mmol/L (ref 22–32)
Calcium: 9.7 mg/dL (ref 8.9–10.3)
Chloride: 99 mmol/L (ref 98–111)
Creatinine, Ser: 0.86 mg/dL (ref 0.44–1.00)
GFR, Estimated: >60 mL/min (ref >60)
Glucose, Bld: 138 mg/dL — ABNORMAL HIGH (ref 70–99)
Potassium: 2.9 mmol/L — ABNORMAL LOW (ref 3.5–5.1)
Sodium: 140 mmol/L (ref 135–145)

## 2023-05-31 LAB — CBC
HCT: 41.8 % (ref 36.0–46.0)
Hemoglobin: 13.9 g/dL (ref 12.0–15.0)
MCH: 25.5 pg — ABNORMAL LOW (ref 26.0–34.0)
MCHC: 33.3 g/dL (ref 30.0–36.0)
MCV: 76.7 fL — ABNORMAL LOW (ref 80.0–100.0)
Platelets: 283 10*3/uL (ref 150–400)
RBC: 5.45 MIL/uL — ABNORMAL HIGH (ref 3.87–5.11)
RDW: 17 % — ABNORMAL HIGH (ref 11.5–15.5)
WBC: 8.2 10*3/uL (ref 4.0–10.5)
nRBC: 0 % (ref 0.0–0.2)

## 2023-05-31 LAB — CREATININE, SERUM
Creatinine, Ser: 0.71 mg/dL (ref 0.44–1.00)
GFR, Estimated: >60 mL/min (ref >60)

## 2023-05-31 LAB — URINALYSIS, ROUTINE W REFLEX MICROSCOPIC
Bilirubin Urine: NEGATIVE
Glucose, UA: NEGATIVE mg/dL
Hgb urine dipstick: NEGATIVE
Ketones, ur: NEGATIVE mg/dL
Leukocytes,Ua: NEGATIVE
Nitrite: NEGATIVE
Protein, ur: 100 mg/dL — AB
Specific Gravity, Urine: 1.025 (ref 1.005–1.030)
pH: 7 (ref 5.0–8.0)

## 2023-05-31 LAB — URINALYSIS, MICROSCOPIC (REFLEX)

## 2023-05-31 LAB — TROPONIN T, HIGH SENSITIVITY
Troponin T High Sensitivity: <15 ng/L (ref <19)
Troponin T High Sensitivity: <15 ng/L (ref <19)

## 2023-05-31 LAB — HIV ANTIBODY (ROUTINE TESTING W REFLEX): HIV Screen 4th Generation wRfx: NONREACTIVE

## 2023-05-31 LAB — PRO BRAIN NATRIURETIC PEPTIDE: Pro Brain Natriuretic Peptide: <36 pg/mL (ref <300.0)

## 2023-05-31 MED ORDER — ENOXAPARIN SODIUM 40 MG/0.4ML IJ SOSY
40.0000 mg | PREFILLED_SYRINGE | INTRAMUSCULAR | Status: DC
  Administered 2023-05-31: 40 mg via SUBCUTANEOUS
  Filled 2023-05-31: qty 0.4

## 2023-05-31 MED ORDER — ACETAMINOPHEN 325 MG PO TABS
650.0000 mg | ORAL_TABLET | Freq: Four times a day (QID) | ORAL | Status: DC | PRN
  Administered 2023-05-31 – 2023-06-02 (×4): 650 mg via ORAL
  Filled 2023-05-31 (×4): qty 2

## 2023-05-31 MED ORDER — LISINOPRIL 20 MG PO TABS: 40.0000 mg | ORAL_TABLET | Freq: Two times a day (BID) | ORAL | Status: DC

## 2023-05-31 MED ORDER — CARVEDILOL 12.5 MG PO TABS
12.5000 mg | ORAL_TABLET | Freq: Two times a day (BID) | ORAL | Status: DC
  Administered 2023-05-31 – 2023-06-02 (×4): 12.5 mg via ORAL
  Filled 2023-05-31 (×4): qty 1

## 2023-05-31 MED ORDER — PRAVASTATIN SODIUM 10 MG PO TABS
20.0000 mg | ORAL_TABLET | Freq: Every evening | ORAL | Status: DC
  Administered 2023-05-31 – 2023-06-01 (×2): 20 mg via ORAL
  Filled 2023-05-31 (×2): qty 2

## 2023-05-31 MED ORDER — POTASSIUM CHLORIDE CRYS ER 10 MEQ PO TBCR
10.0000 meq | EXTENDED_RELEASE_TABLET | Freq: Every day | ORAL | Status: DC
Start: 2023-06-01 — End: 2023-06-01
  Filled 2023-05-31 (×2): qty 1

## 2023-05-31 MED ORDER — TRAMADOL HCL 50 MG PO TABS
50.0000 mg | ORAL_TABLET | Freq: Four times a day (QID) | ORAL | Status: DC | PRN
Start: 2023-05-31 — End: 2023-06-02
  Administered 2023-05-31 – 2023-06-02 (×5): 50 mg via ORAL
  Filled 2023-05-31 (×5): qty 1

## 2023-05-31 MED ORDER — ENOXAPARIN SODIUM 80 MG/0.8ML IJ SOSY
80.0000 mg | PREFILLED_SYRINGE | INTRAMUSCULAR | Status: DC
  Administered 2023-06-01: 80 mg via SUBCUTANEOUS
  Filled 2023-05-31: qty 0.8

## 2023-05-31 MED ORDER — POTASSIUM CHLORIDE CRYS ER 20 MEQ PO TBCR
60.0000 meq | EXTENDED_RELEASE_TABLET | Freq: Once | ORAL | Status: AC
  Administered 2023-05-31: 60 meq via ORAL
  Filled 2023-05-31: qty 3

## 2023-05-31 MED ORDER — FLUTICASONE FUROATE-VILANTEROL 200-25 MCG/ACT IN AEPB
1.0000 | INHALATION_SPRAY | Freq: Every day | RESPIRATORY_TRACT | Status: DC
  Administered 2023-05-31 – 2023-06-02 (×3): 1 via RESPIRATORY_TRACT
  Filled 2023-05-31: qty 28

## 2023-05-31 MED ORDER — IPRATROPIUM-ALBUTEROL 0.5-2.5 (3) MG/3ML IN SOLN: 3.0000 mL | Freq: Four times a day (QID) | RESPIRATORY_TRACT | Status: DC | PRN

## 2023-05-31 MED ORDER — POTASSIUM CHLORIDE CRYS ER 20 MEQ PO TBCR
40.0000 meq | EXTENDED_RELEASE_TABLET | Freq: Once | ORAL | Status: AC
  Administered 2023-05-31: 40 meq via ORAL
  Filled 2023-05-31: qty 2

## 2023-05-31 MED ORDER — AMLODIPINE BESYLATE 10 MG PO TABS
10.0000 mg | ORAL_TABLET | Freq: Every day | ORAL | Status: DC
  Administered 2023-05-31 – 2023-06-02 (×3): 10 mg via ORAL
  Filled 2023-05-31 (×3): qty 1

## 2023-05-31 MED ORDER — LISINOPRIL 20 MG PO TABS
40.0000 mg | ORAL_TABLET | Freq: Every day | ORAL | Status: DC
  Administered 2023-05-31: 40 mg via ORAL
  Filled 2023-05-31 (×2): qty 2

## 2023-05-31 MED ORDER — FUROSEMIDE 10 MG/ML IJ SOLN
40.0000 mg | Freq: Once | INTRAMUSCULAR | Status: AC
  Administered 2023-05-31: 40 mg via INTRAVENOUS
  Filled 2023-05-31: qty 4

## 2023-05-31 MED ORDER — PANTOPRAZOLE SODIUM 40 MG PO TBEC
40.0000 mg | DELAYED_RELEASE_TABLET | Freq: Every day | ORAL | Status: DC
  Administered 2023-05-31 – 2023-06-02 (×3): 40 mg via ORAL
  Filled 2023-05-31 (×3): qty 1

## 2023-05-31 MED ORDER — HYDRALAZINE HCL 20 MG/ML IJ SOLN
5.0000 mg | Freq: Once | INTRAMUSCULAR | Status: AC
  Administered 2023-05-31: 5 mg via INTRAVENOUS
  Filled 2023-05-31: qty 1

## 2023-05-31 MED ORDER — POTASSIUM CHLORIDE CRYS ER 20 MEQ PO TBCR
20.0000 meq | EXTENDED_RELEASE_TABLET | Freq: Once | ORAL | Status: AC
  Administered 2023-05-31: 20 meq via ORAL
  Filled 2023-05-31: qty 1

## 2023-05-31 MED ORDER — POTASSIUM CHLORIDE ER 10 MEQ PO TBCR: 10.0000 meq | EXTENDED_RELEASE_TABLET | Freq: Every day | ORAL | Status: DC

## 2023-05-31 MED ORDER — HYDROCHLOROTHIAZIDE 25 MG PO TABS
25.0000 mg | ORAL_TABLET | Freq: Every day | ORAL | Status: DC
  Administered 2023-05-31: 25 mg via ORAL
  Filled 2023-05-31 (×2): qty 1

## 2023-05-31 MED ORDER — MAGNESIUM SULFATE 2 GM/50ML IV SOLN
2.0000 g | Freq: Once | INTRAVENOUS | Status: AC
  Administered 2023-05-31: 2 g via INTRAVENOUS
  Filled 2023-05-31: qty 50

## 2023-05-31 NOTE — ED Notes (Signed)
Pt. Aware urine specimen needed. 

## 2023-05-31 NOTE — ED Triage Notes (Signed)
 Pt states High blood pressure and high hear rate for last week. Was trying to sleep and woke up to heart racing and tingling.

## 2023-05-31 NOTE — H&P (Addendum)
 History and Physical    Patient: Ashley Warren:[id number] DOB: 1955-07-04 DOA: 05/31/2023 DOS: the patient was seen and examined on 05/31/2023 PCP: Angelia Kelp, NP  Patient coming from: Home  Chief Complaint:  Chief Complaint  Patient presents with   Hypertension   HPI: Ashley Warren is a 68 y.o. female with medical history significant of HTN, s/p hysterectomy p/w BLE edema iso HTN urgency.  Pt states that she was in her USOH until yesterday afternoon at 1700 when she felt dizzy. Pt states that she was visiting and caring for her mother (mostly sitting and watching her), when she noticed unexplained dizziness. She initially attributed this to being hungry and figured it would resolved after dinner. She went home, ate, and had another episode of dizziness around 2100, but this time she also felt fatigue. 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 She slept for about an hour before awaking with a pounding, racing heart; as such, she got up checked her BP and it was 210/115 (reportedly BP normally 170/80) so she called her son who brought her to the ED.  In OSH ED, pt hypertensive to 217/99. Labs notable for hypokalemia. Pt admitted to medicine for ongoing care.  Review of Systems: As mentioned in the history of present illness. All other systems reviewed and are negative. Past Medical History:  Diagnosis Date   Hypertension    Past Surgical History:  Procedure Laterality Date   ABDOMINAL HYSTERECTOMY     APPENDECTOMY     COSMETIC SURGERY     mass removed from shoulder/scapula area   Social History:  reports that she has never smoked. She has never used smokeless tobacco. She reports current alcohol  use. She reports that she does not use drugs.  Allergies  Allergen Reactions   Penicillins Itching    Family History  Problem Relation Age of Onset   Alcohol   abuse Maternal Grandmother    Autism spectrum disorder Sister    Breast cancer Mother    Depression Sister    Hypertension Mother    Hypertension Sister    Osteoarthritis Maternal Grandmother     Prior to Admission medications   Medication Sig Start Date End Date Taking? Authorizing Provider  albuterol  (PROVENTIL ) (2.5 MG/3ML) 0.083% nebulizer solution Inhale 3 mL (2.5 mg total) into the lungs via nebulization every 8 (eight) hours as needed for wheezing or shortness of breath. Patient taking differently: Take 2.5 mg by nebulization every 8 (eight) hours as needed for wheezing or shortness of breath. 03/08/23  Yes   albuterol  (VENTOLIN  HFA) 108 (90 Base) MCG/ACT inhaler Inhale 2 puffs every 6 (six) hours as needed for wheezing (cough). Patient taking differently: Inhale 2 puffs into the lungs in the morning and at bedtime. 02/08/23  Yes   amLODipine  (NORVASC ) 10 MG tablet Take 1 tablet (10 mg total) by mouth daily. 04/21/23  Yes   Ascorbic Acid  (VITAMIN C CR) 500 MG CPCR Take 500 mg by mouth daily.   Yes [provider]  B Complex Vitamins (B COMPLEX PO) Take 1 tablet by mouth daily.   Yes [provider]  BACILLUS COAGULANS-INULIN PO Take 1 tablet by mouth daily.   Yes [provider]  Black Cohosh 160 MG CAPS Take 160 mg by mouth daily.   Yes [provider]  Boswellia Serrata (BOSWELLIA PO) Take 375  mg by mouth daily.   Yes [provider]  budesonide -formoterol  (SYMBICORT ) 160-4.5 MCG/ACT inhaler Inhale 2 puffs into the lungs in the morning and at bedtime. 03/27/23  Yes   cloNIDine  (CATAPRES ) 0.2 MG tablet TAKE 1 TABLET BY MOUTH 2 TIMES DAILY Patient taking differently: Take 0.2 mg by mouth 2 (two) times daily. 06/18/19 05/31/23 Yes Angelia Kelp, NP  diclofenac  Sodium (VOLTAREN ) 1 % GEL APPLY 4 GRAMS 3 TIMES DAILY TO AFFECTED AREA(S) Patient taking differently: Apply 1 Application topically in the morning and at bedtime. 01/11/22  Yes    ergocalciferol (VITAMIN D2) 1.25 MG (50000 UT) capsule Take 50,000 Units by mouth once a week.   Yes [provider]  fluticasone  (FLONASE ) 50 MCG/ACT nasal spray Administer 2 sprays in each nostril daily. Patient taking differently: Place 2 sprays into both nostrils as needed for allergies. 01/20/23  Yes   hydrochlorothiazide  (HYDRODIURIL ) 25 MG tablet Take 1 tablet (25 mg total) by mouth daily. 05/22/23  Yes   ibuprofen  (ADVIL ) 200 MG tablet Take 4 tablets by mouth 2  times daily as needed. Patient taking differently: Take 800 mg by mouth in the morning and at bedtime. 05/21/20  Yes Faith Homes, MD  lisinopril  (ZESTRIL ) 40 MG tablet Take 1 tablet (40 mg) by mouth 2 times daily. 02/15/23  Yes   Milk Thistle 150 MG CAPS Take 150 mg by mouth daily.   Yes [provider]  nystatin  (NYAMYC ) powder USE TWICE A DAY FOR 10 DAYS THEN AS NEEDED Patient taking differently: Apply 1 Application topically daily. 03/22/23  Yes   OVER THE COUNTER MEDICATION Take 2 capsules by mouth daily. FISH OIL OMEGA 3 VIT C VIT E 2000-650-12MG    Yes [provider]  pantoprazole  (PROTONIX ) 40 MG tablet Take 1 tablet (40 mg total) by mouth daily. 04/21/23  Yes   pravastatin  (PRAVACHOL ) 20 MG tablet TAKE 1 TABLET BY MOUTH EVERY EVENING Patient taking differently: Take 20 mg by mouth every evening. 01/16/20 05/31/23 Yes Angelia Kelp, NP  traMADol  (ULTRAM ) 50 MG tablet Take 1 tablet (50 mg total) by mouth every 6 (six) hours as needed for moderate pain (4-6). Patient taking differently: Take 50 mg by mouth 2 (two) times daily. 05/08/23  Yes   traZODone  (DESYREL ) 50 MG tablet Take 1 tablet (50 mg total) by mouth at bedtime. *stop zolpidem * Patient taking differently: Take 50 mg by mouth at bedtime as needed for sleep. 03/27/23  Yes   VEVYE 0.1 % SOLN Place 1 drop into both eyes 2 (two) times daily.   Yes [provider]  zolpidem  (AMBIEN  CR) 6.25 MG CR tablet Take 1 tablet (6.25 mg total) by mouth  at bedtime as needed for sleep 12/15/21  Yes   albuterol  (VENTOLIN  HFA) 108 (90 Base) MCG/ACT inhaler Inhale 2 puffs into the lungs every 6 (six) hours as needed. 03/08/23     chlorpheniramine-HYDROcodone (TUSSIONEX) 10-8 MG/5ML Take 5 mLs by mouth every 12 (twelve) hours as needed for cough. Patient not taking: Reported on 05/31/2023 02/08/23     potassium chloride  (KLOR-CON ) 10 MEQ tablet Take 1 tablet (10 mEq total) by mouth daily for 15 days. Patient not taking: Reported on 05/31/2023 04/29/23 05/16/23  Arvilla Birmingham, MD  prednisoLONE  acetate (PRED FORTE ) 1 % ophthalmic suspension Place 1 drop into the right eye 4 (four) times daily. Patient not taking: Reported on 05/31/2023 03/07/23     Semaglutide ,0.25 or 0.5MG /DOS, (OZEMPIC , 0.25 OR 0.5 MG/DOSE,) 2 MG/3ML SOPN Inject  0.25mg  into the skin once a week for 30 days Patient not taking: Reported on 05/31/2023 03/28/21     Semaglutide -Weight Management (WEGOVY ) 0.25 MG/0.5ML SOAJ Inject 0.25 mg into the skin every 7 days. Patient not taking: Reported on 05/31/2023 06/29/21     Semaglutide -Weight Management (WEGOVY ) 0.25 MG/0.5ML SOAJ Inject 0.25 mg into the skin every 7 (seven) days. 09/28/22     Semaglutide -Weight Management (WEGOVY ) 0.25 MG/0.5ML SOAJ Inject 0.25 mg into the skin every 7 days 12/05/22     Semaglutide -Weight Management (WEGOVY ) 0.25 MG/0.5ML SOAJ Inject 0.25 mg into the skin every 7 (seven) days. 04/17/23     Spacer/Aero-Holding Chambers DEVI Use as directed 09/30/13   Otilia Bloch, MD    Physical Exam: Vitals:   05/31/23 0530 05/31/23 0749 05/31/23 0856 05/31/23 1143  BP: (!) 186/84 (!) 159/79 (!) 176/93 (!) 151/98  Pulse: 91 84 77 76  Resp: 14  20 18   Temp:  98.6 F (37 C) 98.7 F (37.1 C)   TempSrc:  Oral Oral   SpO2: 99% 100% 99% 96%  Weight:      Height:       General: Alert, oriented x3, resting comfortably in no acute distress Respiratory: Lungs clear to auscultation bilaterally with normal respiratory effort; no  w/r/r Cardiovascular: Regular rate and rhythm w/o m/r/g   Data Reviewed:  Lab Results  Component Value Date   WBC 8.8 05/31/2023   HGB 13.8 05/31/2023   HCT 41.9 05/31/2023   MCV 77.4 (L) 05/31/2023   PLT 282 05/31/2023   Lab Results  Component Value Date   GLUCOSE 138 (H) 05/31/2023   CALCIUM  9.7 05/31/2023   NA 140 05/31/2023   K 2.9 (L) 05/31/2023   CO2 30 05/31/2023   CL 99 05/31/2023   BUN 16 05/31/2023   CREATININE 0.86 05/31/2023   Lab Results  Component Value Date   ALT 25 05/21/2020   AST 20 05/21/2020   ALKPHOS 79 05/21/2020   BILITOT 0.3 05/21/2020   No results found for: "INR", "PROTIME"  Radiology: CT Head Wo Contrast Result Date: 05/31/2023 CLINICAL DATA:  Syncope with cerebrovascular injury suspected. EXAM: CT HEAD WITHOUT CONTRAST TECHNIQUE: Contiguous axial images were obtained from the base of the skull through the vertex without intravenous contrast. RADIATION DOSE REDUCTION: This exam was performed according to the departmental dose-optimization program which includes automated exposure control, adjustment of the mA and/or kV according to patient size and/or use of iterative reconstruction technique. COMPARISON:  05/20/2020 FINDINGS: Brain: No evidence of acute infarction, hemorrhage, hydrocephalus, extra-axial collection or mass lesion/mass effect. Superior vermian atrophy, stable. Vascular: No hyperdense vessel or unexpected calcification. Skull: Normal. Negative for fracture or focal lesion. Sinuses/Orbits: No acute finding. Other: None. IMPRESSION: No acute or interval finding. Electronically Signed   By: Ronnette Coke M.D.   On: 05/31/2023 06:18   DG Chest Portable 1 View Result Date: 05/31/2023 CLINICAL DATA:  Heart racing and hypertension EXAM: PORTABLE CHEST 1 VIEW COMPARISON:  02/04/2023 FINDINGS: Mild cardiac enlargement. Interstitial coarsening which is similar to prior. No air bronchogram, effusion, or pneumothorax. Artifact from EKG leads. No  acute osseous finding. IMPRESSION: Borderline heart size which generalized interstitial coarsening favoring mild edema. Electronically Signed   By: Ronnette Coke M.D.   On: 05/31/2023 05:18    Assessment and Plan: 16F h/o HTN, s/p hysterectomy p/w BLE edema iso HTN urgency.  HTN urgency BLE edema Pt presented with elevated BP and LE edema. High suspicion for prolonged HTN as  well as high dose CCB causing LE edema. ADHF less likely. BNP wnl. Troponin wnl. -PTA amlodipine  10mg  daily, hydrochlorothiazide  25mg  daily, and lisinopril  40mg  daily (pta pt reportedly on lisinopril  40mg  BID unclear dosing) -HOLD pta clonidine  -Start coreg 12.5mg  BID -F/u TTE  Hypokalemia Likely related to hydrochlorothiazide  -Kdur x1 on 5/21 -PTA Kdur 10mEq daily -Consider spironolactone for diuretic and BP control if needed  Advance Care Planning:   Code Status: Full Code   Consults: N/A  Family Communication: N/A  Severity of Illness: The appropriate patient status for this patient is INPATIENT. Inpatient status is judged to be reasonable and necessary in order to provide the required intensity of service to ensure the patient's safety. The patient's presenting symptoms, physical exam findings, and initial radiographic and laboratory data in the context of their chronic comorbidities is felt to place them at high risk for further clinical deterioration. Furthermore, it is not anticipated that the patient will be medically stable for discharge from the hospital within 2 midnights of admission.   * I certify that at the point of admission it is my clinical judgment that the patient will require inpatient hospital care spanning beyond 2 midnights from the point of admission due to high intensity of service, high risk for further deterioration and high frequency of surveillance required.*   ------- I spent 55 minutes reviewing previous labs/notes, obtaining separate history at the bedside,  counseling/discussing the treatment plan outlined above, ordering medications/tests, and performing clinical documentation.  Author: Arne Langdon, MD 05/31/2023 12:30 PM  For on call review [email or web address].

## 2023-05-31 NOTE — ED Notes (Signed)
 Patient transported to CT

## 2023-05-31 NOTE — Progress Notes (Signed)
 Pharmacy adjusted lovenox  dose to 0.5 mg/kg (= 80 mg) for DVT prophylaxis in obese patient.  Joanell Mowers, Davey Erp, BCCP Clinical Pharmacist  05/31/2023 3:33 PM   [facility] pharmacy phone numbers are listed on [email or web address]

## 2023-05-31 NOTE — Progress Notes (Signed)
 Hospitalist Transfer Note:    Nursing staff, Please call [facility] Admits & Consults System-Wide number on Amion [phone]) as soon as patient's arrival, so appropriate admitting provider can evaluate the pt.   Transferring facility: [facility] Requesting provider: Dr. Palumbo (EDP at [facility]) Reason for transfer: admission for further evaluation and management of acute on chronic diastolic heart failure.     68 y.o. female w/ h/o HTN, chronic diastolic heart failure, who presented to [facility] ED complaining of mild shortness of breath over the last few days associated with new edema in the bilateral lower extremities.  Over the last week, she has noted her blood pressure to be elevated when checked as an outpatient, in spite of good compliance with her outpatient antihypertensive regimen that includes oral clonidine , lisinopril , HCTZ, Norvasc .  She denies any recent chest pain, but has noted some intermittent palpitations over the course of the last week.  No report of any acute focal neurologic deficits.  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  She notes that she took each of the above 4 home antihypertensive medications prior to tonight shift.  Her medical history, in addition to essential hypertension, is also notable for chronic diastolic heart failure, with most recent echocardiogram in May 2022, which was notable for LVEF greater than 75% as well as grade 1 diastolic dysfunction.  Aside from HCTZ, she is not reported to be on any diuretic medications at home.  Vital signs in the ED were notable for the following: Afebrile; initial blood pressure reported to be 217/120, heart rates in the 80s to 90s; respiratory rate 14-21, oxygen  saturation 98 to 98% on room air.  After receiving doses of IV hydralazine as well as Lasix  40 mg IV x 1 dose,  blood pressures improved to 179/77.  Labs were notable for BMP, which is notable for potassium level 2.9.  BNP less than 36, in the setting of BMI of nearly 64.  High sensitive troponin I x 1 value was  less than 15.  EKG demonstrates sinus rhythm with mild QTc prolongation , without overt evidence of acute ischemic changes, including no evidence of ST elevation.  Imaging notable for chest x-ray, which is reported to show evidence of pulmonary edema.   In addition to the previously mentioned dose of IV Lasix  as well as IV hydralazine, the patient also received potassium chloride  60 meq p.o. x 1 dose as well as 2 g of IV magnesium  sulfate. Given anticipation of additional IV diuresis, with normal renal function, I requested that the patient receive an additional 20 meq of oral Kcl at this time.    Subsequently, I accepted this patient for transfer for observation to a PCU bed at [facility] for further work-up and management of the above.      Camelia Cavalier, DO Hospitalist

## 2023-05-31 NOTE — Progress Notes (Signed)
 Pt woke up to heart racing and tingling. Hx HTN.

## 2023-05-31 NOTE — TOC CM/SW Note (Addendum)
 Transition of Care [facility]) - Inpatient Brief Assessment   Patient Details  Name: Ashley Warren MRN: [id number] Date of Birth: 1955-11-01  Transition of Care [facility]) CM/SW Contact:    Ashley Model, RN Phone Number: 05/31/2023, 3:21 PM   Clinical Narrative: From home with son and DIL, has PCP and insurance on file, states has no HH services in place at this time or DME at home.  States family member will transport them home at [employer] and family is support system, states gets medications from [facility] outpatient pharmacy.  Pta self ambulatory. Patient gives this NCM permission to speak with her son, Ashley Warren.   Transition of Care Asessment: Insurance and Status: Insurance coverage has been reviewed Patient has primary care physician: Yes Home environment has been reviewed: home with son and DIL Prior level of function:: indep Prior/Current Home Services: No current home services Social Drivers of Health Review: SDOH reviewed no interventions necessary Readmission risk has been reviewed: Yes Transition of care needs: no transition of care needs at this time

## 2023-05-31 NOTE — Plan of Care (Signed)
  Problem: Nutrition: Goal: Adequate nutrition will be maintained Outcome: Completed/Met   Problem: Elimination: Goal: Will not experience complications related to urinary retention Outcome: Completed/Met   Problem: Pain Managment: Goal: General experience of comfort will improve and/or be controlled Outcome: Completed/Met   Problem: Safety: Goal: Ability to remain free from injury will improve Outcome: Completed/Met   Problem: Skin Integrity: Goal: Risk for impaired skin integrity will decrease Outcome: Completed/Met

## 2023-05-31 NOTE — ED Provider Notes (Addendum)
 [place] [facility]PARTMENT AT [facility] Provider Note   CSN: [id number] Arrival date & time: 05/31/23  0422     History  Chief Complaint  Patient presents with   Hypertension    Ashley Warren is a 68 y.o. female.  The history is provided by the patient.  Hypertension This is a chronic problem. The current episode started more than 1 week ago. The problem occurs constantly. The problem has been gradually worsening. Pertinent negatives include no abdominal pain and no headaches. Associated symptoms comments: Palpitations, feels like heart is racing . Nothing aggravates the symptoms. Nothing relieves the symptoms. Treatments tried: all BP medications take at 11 pm. The treatment provided no relief.  Patient with HTN presents with elevated blood pressure and palpitations x 1 week.  States her heart has been racing. She was attempting to sleep for work and these symptoms awakened her.  She reports that she is compliant with medications and took her last dose at 11 pm of all her Blood pressure medication.  No recent changes in medication.  She has not had any lifestyle changes: no changes in sodium or caffeine consumption.      Past Medical History:  Diagnosis Date   Hypertension      Home Medications Prior to Admission medications   Medication Sig Start Date End Date Taking? Authorizing Provider  albuterol  (PROVENTIL  HFA;VENTOLIN  HFA) 108 (90 BASE) MCG/ACT inhaler Inhale 2 puffs into the lungs every 4 (four) hours as needed for wheezing or shortness of breath. 09/30/13   Otilia Bloch, MD  albuterol  (PROVENTIL ) (2.5 MG/3ML) 0.083% nebulizer solution Inhale 3 mL (2.5 mg total) into the lungs via nebulization every 8 (eight) hours as needed for wheezing or shortness of breath. 03/08/23     albuterol  (VENTOLIN  HFA) 108 (90 Base) MCG/ACT inhaler Inhale 2 puffs into the lungs every 6 (six) hours as needed. 06/15/20     albuterol  (VENTOLIN  HFA) 108 (90 Base) MCG/ACT  inhaler Inhale 2 puffs every 6 (six) hours as needed for wheezing (cough). 02/08/23     albuterol  (VENTOLIN  HFA) 108 (90 Base) MCG/ACT inhaler Inhale 2 puffs into the lungs every 6 (six) hours as needed. 03/08/23     amLODipine  (NORVASC ) 10 MG tablet Take 1 tablet (10 mg total) by mouth daily. 04/21/23     Ascorbic Acid  (VITAMIN C CR) 500 MG CPCR Take 500 mg by mouth daily.    [provider]  azelastine  (OPTIVAR ) 0.05 % ophthalmic solution Place 1 drop into both eyes 2 (two) times daily. 12/13/21     B Complex Vitamins (B COMPLEX PO) Take 1 tablet by mouth daily.    [provider]  BACILLUS COAGULANS-INULIN PO Take 2 tablets by mouth daily.    [provider]  Black Cohosh 160 MG CAPS Take 160 mg by mouth daily.    [provider]  Boswellia Serrata (BOSWELLIA PO) Take 375 mg by mouth daily.    [provider]  budesonide -formoterol  (SYMBICORT ) 160-4.5 MCG/ACT inhaler Inhale 2 puffs into the lungs 2 (two) times daily.    [provider]  budesonide -formoterol  (SYMBICORT ) 160-4.5 MCG/ACT inhaler Inhale 2 puffs into the lungs in the morning and at bedtime. 03/27/23     calcium  carbonate (OS-CAL) 1250 (500 Ca) MG chewable tablet Chew 1,250 mg by mouth daily.    [provider]  celecoxib  (CELEBREX ) 200 MG capsule Take 1 capsule by mouth daily 06/11/20     chlorpheniramine-HYDROcodone (TUSSIONEX) 10-8 MG/5ML Take  5 mLs by mouth every 12 (twelve) hours as needed for cough. 02/08/23     cloNIDine  (CATAPRES ) 0.2 MG tablet TAKE 1 TABLET BY MOUTH 2 TIMES DAILY Patient taking differently: Take 0.2 mg by mouth 2 (two) times daily. 06/18/19 06/17/20  Angelia Kelp, NP  cloNIDine  (CATAPRES ) 0.2 MG tablet Take 1 tablet (0.2 mg total) by mouth 2 (two) times daily. 12/16/22     diclofenac  Sodium (VOLTAREN ) 1 % GEL APPLY 4 GRAMS 3 TIMES DAILY TO AFFECTED AREA(S) 01/11/22     ergocalciferol (VITAMIN D2) 1.25 MG (50000 UT) capsule Take 50,000 Units by mouth 2 (two)  times a week. No specific days    [provider]  fluticasone  (FLONASE ) 50 MCG/ACT nasal spray Administer 2 sprays in each nostril daily. 01/20/23     gabapentin  (NEURONTIN ) 100 MG capsule Take 1 capsule by mouth at bedtime for 1 week, then increase to 2 capsules for 1 week, then increase to 3 capsules if tolerated 10/27/21     gabapentin  (NEURONTIN ) 100 MG capsule Take 1 capsule (100 mg total) by mouth at bedtime for 7 days, THEN 2 capsules (200 mg total) at bedtime for 7 days, THEN 3 capsules (300 mg total) at bedtime if tolerated 12/15/21 01/21/22    hydrochlorothiazide  (HYDRODIURIL ) 25 MG tablet Take 1 tablet (25 mg total) by mouth daily. 05/22/23     ibuprofen  (ADVIL ) 200 MG tablet Take 4 tablets by mouth 2  times daily as needed. 05/21/20   Faith Homes, MD  lisinopril  (ZESTRIL ) 40 MG tablet Take 1 tablet (40 mg total) by mouth daily. 05/21/20   Faith Homes, MD  lisinopril  (ZESTRIL ) 40 MG tablet Take 1 tablet (40 mg) by mouth 2 times daily. 02/15/23     Milk Thistle 150 MG CAPS Take 150 mg by mouth daily.    [provider]  mupirocin nasal ointment (BACTROBAN) 2 % Place 1 application into the nose 2 (two) times daily as needed (nasal itching). Use one-half of tube in each nostril twice daily for five (5) days. After application, press sides of nose together and gently massage.    [provider]  nirmatrelvir  & ritonavir  (PAXLOVID , 300/100,) 20 x 150 MG & 10 x 100MG  TBPK Take 300 mg nirmatrelvir  (two 150 mg tablets) with 100 mg ritonavir  (one 100 mg tablet) by mouth twice daily for 5 days 02/09/22     nystatin  (NYAMYC ) powder USE TWICE A DAY FOR 10 DAYS THEN AS NEEDED 03/22/23     OVER THE COUNTER MEDICATION Take 2 capsules by mouth daily. FISH OIL OMEGA 3 VIT C VIT E 2000-650-12MG     [provider]  pantoprazole  (PROTONIX ) 40 MG tablet Take 1 tablet (40 mg total) by mouth daily. 04/21/23     phentermine  (ADIPEX-P ) 37.5 MG tablet Take 1 tablet by mouth daily  before breakfast. 11/03/20     polyvinyl alcohol  (LIQUIFILM TEARS) 1.4 % ophthalmic solution Place 1 drop into both eyes daily.    [provider]  potassium chloride  (KLOR-CON ) 10 MEQ tablet Take 1 tablet (10 mEq total) by mouth daily for 5 days. 11/09/20 11/14/20  Venter, Margaux, PA-C  potassium chloride  (KLOR-CON ) 10 MEQ tablet Take 1 tablet (10 mEq total) by mouth daily for 5 days. 01/20/21     potassium chloride  (KLOR-CON ) 10 MEQ tablet Take 1 tablet (10 mEq total) by mouth daily for 15 days. 04/29/23 05/16/23  Arvilla Birmingham, MD  potassium chloride  SA (KLOR-CON  M) 20 MEQ tablet Take 2 tablets (  40 mEq total) by mouth daily. 08/03/21     potassium chloride  SA (KLOR-CON ) 20 MEQ tablet TAKE 2 TABLETS BY MOUTH DAILY 05/11/20     pravastatin  (PRAVACHOL ) 20 MG tablet TAKE 1 TABLET BY MOUTH EVERY EVENING Patient taking differently: Take 20 mg by mouth every evening. 01/16/20 01/15/21  Angelia Kelp, NP  pravastatin  (PRAVACHOL ) 20 MG tablet Take 1 tablet (20 mg total) by mouth nightly. 04/07/23     prednisoLONE  acetate (PRED FORTE ) 1 % ophthalmic suspension Place 1 drop into the right eye 4 (four) times daily. 03/07/23     Semaglutide ,0.25 or 0.5MG /DOS, (OZEMPIC , 0.25 OR 0.5 MG/DOSE,) 2 MG/3ML SOPN Inject 0.25mg  into the skin once a week for 30 days 03/28/21     Semaglutide -Weight Management (WEGOVY ) 0.25 MG/0.5ML SOAJ Inject 0.25 mg into the skin every 7 days. 06/29/21     Semaglutide -Weight Management (WEGOVY ) 0.25 MG/0.5ML SOAJ Inject 0.25 mg into the skin every 7 (seven) days. 09/28/22     Semaglutide -Weight Management (WEGOVY ) 0.25 MG/0.5ML SOAJ Inject 0.25 mg into the skin every 7 days 12/05/22     Semaglutide -Weight Management (WEGOVY ) 0.25 MG/0.5ML SOAJ Inject 0.25 mg into the skin every 7 (seven) days. 04/17/23     Spacer/Aero-Holding Chambers DEVI Use as directed 09/30/13   Otilia Bloch, MD  traMADol  (ULTRAM ) 50 MG tablet Take 1 tablet by mouth every 8 hours as needed for pain. 02/01/21      traMADol  (ULTRAM ) 50 MG tablet Take 1 tablet (50 mg total) by mouth every 8 (eight) hours as needed for moderate pain (4-6). 09/19/22     traMADol  (ULTRAM ) 50 MG tablet Take 1 tablet (50 mg total) by mouth every 6 (six) hours as needed for moderate pain (4-6). 10/18/22     traMADol  (ULTRAM ) 50 MG tablet Take 1 tablet (50 mg total) by mouth every 6 (six) hours as needed for moderate pain (4-6). 05/08/23     traZODone  (DESYREL ) 50 MG tablet Take 1 tablet (50 mg total) by mouth at bedtime. *stop zolpidem * 03/27/23     triamcinolone  cream (KENALOG ) 0.1 % Apply to the affected area(s) as needed 10/27/21     vitamin C (ASCORBIC ACID ) 250 MG tablet Take 250 mg by mouth daily.    [provider]  zolpidem  (AMBIEN  CR) 6.25 MG CR tablet Take 1 tablet (6.25 mg total) by mouth at bedtime as needed for sleep 12/15/21     zolpidem  (AMBIEN  CR) 6.25 MG CR tablet Take 1 tablet (6.25 mg total) by mouth at bedtime as needed for sleep. STOP Zolpidem  5mg . 01/20/23     zolpidem  (AMBIEN ) 5 MG tablet Take 1 tablet (5 mg total) by mouth at bedtime as needed for sleep. 09/23/21         Allergies    Penicillins    Review of Systems   Review of Systems  Constitutional:  Negative for fever.  Cardiovascular:  Positive for palpitations.  Gastrointestinal:  Negative for abdominal pain.  Neurological:  Negative for headaches.  All other systems reviewed and are negative.   Physical Exam Updated Vital Signs BP (!) 179/77   Pulse 89   Temp 98.3 F (36.8 C) (Oral)   Resp (!) 21   Ht 5\' 2"  (1.575 m)   Wt (!) 158.5 kg   SpO2 98%   BMI 63.92 kg/m  Physical Exam Vitals and nursing note reviewed.  Constitutional:      Appearance: Normal appearance. She is well-developed. She is not diaphoretic.  HENT:  Head: Normocephalic and atraumatic.     Nose: Nose normal.  Eyes:     Pupils: Pupils are equal, round, and reactive to light.  Cardiovascular:     Rate and Rhythm: Normal rate and regular rhythm.      Pulses: Normal pulses.     Heart sounds: Normal heart sounds.  Pulmonary:     Effort: Pulmonary effort is normal. No respiratory distress.     Breath sounds: Rales present.     Comments: Appears short of breath  Abdominal:     General: Bowel sounds are normal. There is no distension.     Palpations: Abdomen is soft.     Tenderness: There is no abdominal tenderness. There is no guarding or rebound.  Musculoskeletal:        General: Normal range of motion.     Cervical back: Normal range of motion and neck supple.     Right lower leg: Edema present.     Left lower leg: Edema present.     Comments: Mild pedal edema   Skin:    General: Skin is warm and dry.     Capillary Refill: Capillary refill takes less than 2 seconds.     Findings: No erythema or rash.  Neurological:     General: No focal deficit present.     Mental Status: She is alert and oriented to person, place, and time.     Deep Tendon Reflexes: Reflexes normal.  Psychiatric:        Mood and Affect: Mood normal.        Behavior: Behavior normal.     ED Results / Procedures / Treatments   Labs (all labs ordered are listed, but only abnormal results are displayed) Results for orders placed or performed during the hospital encounter of 05/31/23  CBC with Differential   Collection Time: 05/31/23  4:51 AM  Result Value Ref Range   WBC 8.8 4.0 - 10.5 K/uL   RBC 5.41 (H) 3.87 - 5.11 MIL/uL   Hemoglobin 13.8 12.0 - 15.0 g/dL   HCT 69.6 29.5 - 28.4 %   MCV 77.4 (L) 80.0 - 100.0 fL   MCH 25.5 (L) 26.0 - 34.0 pg   MCHC 32.9 30.0 - 36.0 g/dL   RDW 13.2 (H) 44.0 - 10.2 %   Platelets 282 150 - 400 K/uL   nRBC 0.0 0.0 - 0.2 %   Neutrophils Relative % 45 %   Neutro Abs 4.0 1.7 - 7.7 K/uL   Lymphocytes Relative 40 %   Lymphs Abs 3.5 0.7 - 4.0 K/uL   Monocytes Relative 9 %   Monocytes Absolute 0.8 0.1 - 1.0 K/uL   Eosinophils Relative 5 %   Eosinophils Absolute 0.4 0.0 - 0.5 K/uL   Basophils Relative 1 %   Basophils  Absolute 0.1 0.0 - 0.1 K/uL   Immature Granulocytes 0 %   Abs Immature Granulocytes 0.02 0.00 - 0.07 K/uL  Basic metabolic panel   Collection Time: 05/31/23  4:51 AM  Result Value Ref Range   Sodium 140 135 - 145 mmol/L   Potassium 2.9 (L) 3.5 - 5.1 mmol/L   Chloride 99 98 - 111 mmol/L   CO2 30 22 - 32 mmol/L   Glucose, Bld 138 (H) 70 - 99 mg/dL   BUN 16 8 - 23 mg/dL   Creatinine, Ser 7.25 0.44 - 1.00 mg/dL   Calcium  9.7 8.9 - 10.3 mg/dL   GFR, Estimated >36 >64 mL/min   Anion gap  11 5 - 15  Pro Brain natriuretic peptide   Collection Time: 05/31/23  4:51 AM  Result Value Ref Range   Pro Brain Natriuretic Peptide <36.0 <300.0 pg/mL  Troponin T, High Sensitivity   Collection Time: 05/31/23  4:51 AM  Result Value Ref Range   Troponin T High Sensitivity <15 <19 ng/L   DG Chest Portable 1 View Result Date: 05/31/2023 CLINICAL DATA:  Heart racing and hypertension EXAM: PORTABLE CHEST 1 VIEW COMPARISON:  02/04/2023 FINDINGS: Mild cardiac enlargement. Interstitial coarsening which is similar to prior. No air bronchogram, effusion, or pneumothorax. Artifact from EKG leads. No acute osseous finding. IMPRESSION: Borderline heart size which generalized interstitial coarsening favoring mild edema. Electronically Signed   By: Ronnette Coke M.D.   On: 05/31/2023 05:18     EKG EKG Interpretation Date/Time:  Wednesday May 31 2023 04:33:36 EDT Ventricular Rate:  94 PR Interval:  201 QRS Duration:  103 QT Interval:  403 QTC Calculation: 504 R Axis:   -46  Text Interpretation: Sinus rhythm ventricular premature complex RSR' in V1 or V2, right VCD or RVH Left ventricular hypertrophy Prolonged QT interval Confirmed by Saadiya Wilfong ([id number]) on 05/31/2023 4:40:34 AM  Radiology DG Chest Portable 1 View Result Date: 05/31/2023 CLINICAL DATA:  Heart racing and hypertension EXAM: PORTABLE CHEST 1 VIEW COMPARISON:  02/04/2023 FINDINGS: Mild cardiac enlargement. Interstitial coarsening which is  similar to prior. No air bronchogram, effusion, or pneumothorax. Artifact from EKG leads. No acute osseous finding. IMPRESSION: Borderline heart size which generalized interstitial coarsening favoring mild edema. Electronically Signed   By: Ronnette Coke M.D.   On: 05/31/2023 05:18    Procedures Procedures    Medications Ordered in ED Medications  magnesium  sulfate IVPB 2 g 50 mL (2 g Intravenous New Bag/Given 05/31/23 0543)  potassium chloride  SA (KLOR-CON  M) CR tablet 20 mEq (has no administration in time range)  hydrALAZINE (APRESOLINE) injection 5 mg (5 mg Intravenous Given 05/31/23 0451)  furosemide  (LASIX ) injection 40 mg (40 mg Intravenous Given 05/31/23 0532)  potassium chloride  SA (KLOR-CON  M) CR tablet 60 mEq (60 mEq Oral Given 05/31/23 0530)    ED Course/ Medical Decision Making/ A&P                                 Medical Decision Making Patient with palpitations and elevated BP for a week, no changes in medications or lifestyle   Amount and/or Complexity of Data Reviewed External Data Reviewed: ECG and notes.    Details: Previous notes reviewed, previous EKG reviewed   Labs: ordered.    Details: First troponin < 15 normal, BNP 36.  Normal white count 8.8, normal hemoglobin 13.8, normal platelets.  Normal sodium 140, low potassium 2.9, normal creatinine.  Urine without UTI, proteinuria.   Radiology: ordered and independent interpretation performed.    Details: Pulmonary edema by me on CXR. Head CT without acute finding  ECG/medicine tests: ordered and independent interpretation performed. Discussion of management or test interpretation with external provider(s): Case d/w Dr. Brock Canner who will accept patient   Risk Prescription drug management. Decision regarding hospitalization. Risk Details: IV Hydralazine for BP. Potassium 80 MeQ given in the ED.  Magnesium  given IV.  IV Lasix  initiated.      Final Clinical Impression(s) / ED Diagnoses Final diagnoses:   Hypertensive urgency  Acute congestive heart failure, unspecified heart failure type (HCC)  Hypokalemia   The patient appears reasonably stabilized  for admission considering the current resources, flow, and capabilities available in the ED at this time, and I doubt any other [facility] requiring further screening and/or treatment in the ED prior to admission.        Levi Klaiber, MD 05/31/23 [phone]

## 2023-06-01 ENCOUNTER — Observation Stay (HOSPITAL_BASED_OUTPATIENT_CLINIC_OR_DEPARTMENT_OTHER)

## 2023-06-01 DIAGNOSIS — R0609 Other forms of dyspnea: Secondary | ICD-10-CM

## 2023-06-01 DIAGNOSIS — I5031 Acute diastolic (congestive) heart failure: Secondary | ICD-10-CM | POA: Diagnosis not present

## 2023-06-01 DIAGNOSIS — I110 Hypertensive heart disease with heart failure: Secondary | ICD-10-CM | POA: Diagnosis not present

## 2023-06-01 LAB — HEMOGLOBIN A1C
Hgb A1c MFr Bld: 5.7 % — ABNORMAL HIGH (ref 4.8–5.6)
Mean Plasma Glucose: 116.89 mg/dL

## 2023-06-01 LAB — BASIC METABOLIC PANEL WITH GFR
Anion gap: 12 (ref 5–15)
BUN: 11 mg/dL (ref 8–23)
CO2: 24 mmol/L (ref 22–32)
Calcium: 10 mg/dL (ref 8.9–10.3)
Chloride: 99 mmol/L (ref 98–111)
Creatinine, Ser: 0.58 mg/dL (ref 0.44–1.00)
GFR, Estimated: >60 mL/min (ref >60)
Glucose, Bld: 113 mg/dL — ABNORMAL HIGH (ref 70–99)
Potassium: 3.4 mmol/L — ABNORMAL LOW (ref 3.5–5.1)
Sodium: 137 mmol/L (ref 135–145)

## 2023-06-01 LAB — ECHOCARDIOGRAM COMPLETE BUBBLE STUDY
Area-P 1/2: 2.27 cm2
S' Lateral: 2.9 cm

## 2023-06-01 MED ORDER — POTASSIUM CHLORIDE CRYS ER 20 MEQ PO TBCR
20.0000 meq | EXTENDED_RELEASE_TABLET | Freq: Two times a day (BID) | ORAL | Status: DC
  Administered 2023-06-01 – 2023-06-02 (×2): 20 meq via ORAL
  Filled 2023-06-01 (×2): qty 1

## 2023-06-01 MED ORDER — POTASSIUM CHLORIDE CRYS ER 20 MEQ PO TBCR
40.0000 meq | EXTENDED_RELEASE_TABLET | Freq: Once | ORAL | Status: AC
  Administered 2023-06-01: 40 meq via ORAL

## 2023-06-01 MED ORDER — PERFLUTREN LIPID MICROSPHERE
1.0000 mL | INTRAVENOUS | Status: AC | PRN
  Administered 2023-06-01: 3 mL via INTRAVENOUS

## 2023-06-01 MED ORDER — FUROSEMIDE 10 MG/ML IJ SOLN
40.0000 mg | Freq: Once | INTRAMUSCULAR | Status: AC
  Administered 2023-06-01: 40 mg via INTRAVENOUS
  Filled 2023-06-01: qty 4

## 2023-06-01 MED ORDER — LISINOPRIL 20 MG PO TABS
20.0000 mg | ORAL_TABLET | Freq: Every day | ORAL | Status: DC
  Administered 2023-06-01 – 2023-06-02 (×2): 20 mg via ORAL
  Filled 2023-06-01 (×2): qty 1

## 2023-06-01 MED ORDER — TRAZODONE HCL 50 MG PO TABS
50.0000 mg | ORAL_TABLET | Freq: Every evening | ORAL | Status: DC | PRN
  Administered 2023-06-01: 50 mg via ORAL
  Filled 2023-06-01: qty 1

## 2023-06-01 NOTE — Progress Notes (Signed)
  Echocardiogram 2D Echocardiogram has been performed.  Fain Home RDCS 06/01/2023, 10:45 AM

## 2023-06-01 NOTE — Progress Notes (Signed)
 PROGRESS NOTE    Ashley Warren  ZOX:[id number] DOB: September 07, 1955 DOA: 05/31/2023 PCP: Angelia Kelp, NP  67/M with hypertension, hysterectomy, morbid obesity presented to the ED with significantly elevated BP and dizziness. - Developed acute onset dizziness subsequently checked her blood pressure that night it was 210/115, she also noticed some fatigue and mild dyspnea at the time and eventually presented to the ED. - [facility] BP was 217/99, labs noted hypokalemia, creatinine was 0.8, proBNP 36, troponin less than 15, hemoglobin 13.8, chest x-ray noted mild interstitial edema, EKG with moderate LVH, no acute ST-T wave changes   Subjective: -Feels better, wants to go home  Assessment and Plan:  Hypertensive urgency ?  CHF - Patient scented with dizziness, mild dyspnea, edema and BP >210 - Component of diastolic CHF is also possibility, improved drastically after a dose of Lasix  - Repeat Lasix  X1 with potassium - Follow-up 2D echocardiogram - Labs pending this morning, hold lisinopril  - Resume amlodipine  and Coreg  Hypokalemia - Secondary to HCTZ, repleted, repeat labs pending today  Borderline diabetes, hyperglycemia Check HbA1c  Morbid obesity, BMI 62.8 - Needs significant diet and lifestyle modification, she is trying to get on GLP-1 agonist therapy but pending approval  DVT prophylaxis: Lovenox  Code Status: Full code Family Communication: None present Disposition Plan: Home later today or in a.m.  Consultants:    Procedures:   Antimicrobials:    Objective: Vitals:   05/31/23 1927 06/01/23 0029 06/01/23 0517 06/01/23 0713  BP: (!) 156/93 (!) 166/91 (!) 171/93 (!) 145/69  Pulse: 78 69 63 64  Resp: 18 18 18 18   Temp: 98.6 F (37 C) 98.7 F (37.1 C) 98.5 F (36.9 C) 98.3 F (36.8 C)  TempSrc: Oral Oral Oral Oral  SpO2: 96% 94% 97% 95%  Weight:   (!) 154 kg   Height:        Intake/Output Summary (Last 24 hours) at 06/01/2023 1005 Last data filed at  06/01/2023 8119 Gross per 24 hour  Intake 356 ml  Output 1200 ml  Net -844 ml   Filed Weights   05/31/23 0427 06/01/23 0517  Weight: (!) 158.5 kg (!) 154 kg    Examination:  General exam: Morbidly obese chronically ill female laying in bed, AO x 3 HEENT: Neck obese unable to assess JVD CVS: S1-S2, regular rhythm Lungs: Few basilar Rales Abdomen: Soft, nontender, bowel sounds present  Extremities: no edema Skin: No rashes Psychiatry:  Mood & affect appropriate.     Data Reviewed:   CBC: Recent Labs  Lab 05/31/23 0451 05/31/23 1250  WBC 8.8 8.2  NEUTROABS 4.0  --   HGB 13.8 13.9  HCT 41.9 41.8  MCV 77.4* 76.7*  PLT 282 283   Basic Metabolic Panel: Recent Labs  Lab 05/31/23 0451 05/31/23 1250  NA 140  --   K 2.9*  --   CL 99  --   CO2 30  --   GLUCOSE 138*  --   BUN 16  --   CREATININE 0.86 0.71  CALCIUM  9.7  --    GFR: Estimated Creatinine Clearance: 100.1 mL/min (by C-G formula based on SCr of 0.71 mg/dL). Liver Function Tests: No results for input(s): "AST", "ALT", "ALKPHOS", "BILITOT", "PROT", "ALBUMIN" in the last 168 hours. No results for input(s): "LIPASE", "AMYLASE" in the last 168 hours. No results for input(s): "AMMONIA" in the last 168 hours. Coagulation Profile: No results for input(s): "INR", "PROTIME" in the last 168 hours. Cardiac Enzymes: No results  for input(s): "CKTOTAL", "CKMB", "CKMBINDEX", "TROPONINI" in the last 168 hours. BNP (last 3 results) Recent Labs    05/31/23 0451  PROBNP <36.0   HbA1C: No results for input(s): "HGBA1C" in the last 72 hours. CBG: No results for input(s): "GLUCAP" in the last 168 hours. Lipid Profile: No results for input(s): "CHOL", "HDL", "LDLCALC", "TRIG", "CHOLHDL", "LDLDIRECT" in the last 72 hours. Thyroid  Function Tests: No results for input(s): "TSH", "T4TOTAL", "FREET4", "T3FREE", "THYROIDAB" in the last 72 hours. Anemia Panel: No results for input(s): "VITAMINB12", "FOLATE", "FERRITIN",  "TIBC", "IRON", "RETICCTPCT" in the last 72 hours. Urine analysis:    Component Value Date/Time   COLORURINE YELLOW 05/31/2023 0450   APPEARANCEUR HAZY (A) 05/31/2023 0450   LABSPEC 1.025 05/31/2023 0450   PHURINE 7.0 05/31/2023 0450   GLUCOSEU NEGATIVE 05/31/2023 0450   HGBUR NEGATIVE 05/31/2023 0450   BILIRUBINUR NEGATIVE 05/31/2023 0450   KETONESUR NEGATIVE 05/31/2023 0450   PROTEINUR 100 (A) 05/31/2023 0450   NITRITE NEGATIVE 05/31/2023 0450   LEUKOCYTESUR NEGATIVE 05/31/2023 0450   Sepsis Labs: @LABRCNTIP (procalcitonin:4,lacticidven:4)  )No results found for this or any previous visit (from the past 240 hours).   Radiology Studies: CT Head Wo Contrast Result Date: 05/31/2023 CLINICAL DATA:  Syncope with cerebrovascular injury suspected. EXAM: CT HEAD WITHOUT CONTRAST TECHNIQUE: Contiguous axial images were obtained from the base of the skull through the vertex without intravenous contrast. RADIATION DOSE REDUCTION: This exam was performed according to the departmental dose-optimization program which includes automated exposure control, adjustment of the mA and/or kV according to patient size and/or use of iterative reconstruction technique. COMPARISON:  05/20/2020 FINDINGS: Brain: No evidence of acute infarction, hemorrhage, hydrocephalus, extra-axial collection or mass lesion/mass effect. Superior vermian atrophy, stable. Vascular: No hyperdense vessel or unexpected calcification. Skull: Normal. Negative for fracture or focal lesion. Sinuses/Orbits: No acute finding. Other: None. IMPRESSION: No acute or interval finding. Electronically Signed   By: Ronnette Coke M.D.   On: 05/31/2023 06:18   DG Chest Portable 1 View Result Date: 05/31/2023 CLINICAL DATA:  Heart racing and hypertension EXAM: PORTABLE CHEST 1 VIEW COMPARISON:  02/04/2023 FINDINGS: Mild cardiac enlargement. Interstitial coarsening which is similar to prior. No air bronchogram, effusion, or pneumothorax. Artifact from  EKG leads. No acute osseous finding. IMPRESSION: Borderline heart size which generalized interstitial coarsening favoring mild edema. Electronically Signed   By: Ronnette Coke M.D.   On: 05/31/2023 05:18     Scheduled Meds:  amLODipine   10 mg Oral Daily   carvedilol  12.5 mg Oral BID WC   enoxaparin  (LOVENOX ) injection  80 mg Subcutaneous Q24H   fluticasone  furoate-vilanterol  1 puff Inhalation Daily   pantoprazole   40 mg Oral Daily   pravastatin   20 mg Oral QPM   Continuous Infusions:   LOS: 0 days    Time spent:    Deforest Fast, MD [facility]   06/01/2023, 10:05 AM

## 2023-06-01 NOTE — Progress Notes (Signed)
 Mobility Specialist Progress Note:   06/01/23 1100  Mobility  Activity Ambulated with assistance in hallway  Level of Assistance Standby assist, set-up cues, supervision of patient - no hands on  Assistive Device None  Distance Ambulated (ft) 300 ft  Activity Response Tolerated well  Mobility Referral Yes  Mobility visit 1 Mobility  Mobility Specialist Start Time (ACUTE ONLY) 1100  Mobility Specialist Stop Time (ACUTE ONLY) 1110  Mobility Specialist Time Calculation (min) (ACUTE ONLY) 10 min   Pt agreeable to mobility session. Required no physical assistance throughout session. Displayed SOB, however SpO2 96% with exertion. Back in chair with all needs met.   Ashley Warren Mobility Specialist Please contact via SecureChat or  Rehab office at [phone]

## 2023-06-01 NOTE — Plan of Care (Signed)
  Problem: Health Behavior/Discharge Planning: Goal: Ability to manage health-related needs will improve Outcome: Progressing   Problem: Clinical Measurements: Goal: Ability to maintain clinical measurements within normal limits will improve Outcome: Progressing Goal: Diagnostic test results will improve Outcome: Progressing Goal: Respiratory complications will improve Outcome: Progressing   

## 2023-06-02 ENCOUNTER — Other Ambulatory Visit (HOSPITAL_COMMUNITY): Payer: Self-pay

## 2023-06-02 DIAGNOSIS — I110 Hypertensive heart disease with heart failure: Secondary | ICD-10-CM | POA: Diagnosis not present

## 2023-06-02 DIAGNOSIS — I5031 Acute diastolic (congestive) heart failure: Secondary | ICD-10-CM | POA: Diagnosis not present

## 2023-06-02 LAB — BASIC METABOLIC PANEL WITH GFR
Anion gap: 8 (ref 5–15)
BUN: 15 mg/dL (ref 8–23)
CO2: 27 mmol/L (ref 22–32)
Calcium: 10.1 mg/dL (ref 8.9–10.3)
Chloride: 101 mmol/L (ref 98–111)
Creatinine, Ser: 0.67 mg/dL (ref 0.44–1.00)
GFR, Estimated: >60 mL/min (ref >60)
Glucose, Bld: 120 mg/dL — ABNORMAL HIGH (ref 70–99)
Potassium: 3.3 mmol/L — ABNORMAL LOW (ref 3.5–5.1)
Sodium: 136 mmol/L (ref 135–145)

## 2023-06-02 LAB — CBC
HCT: 46.9 % — ABNORMAL HIGH (ref 36.0–46.0)
Hemoglobin: 15.4 g/dL — ABNORMAL HIGH (ref 12.0–15.0)
MCH: 25.2 pg — ABNORMAL LOW (ref 26.0–34.0)
MCHC: 32.8 g/dL (ref 30.0–36.0)
MCV: 76.9 fL — ABNORMAL LOW (ref 80.0–100.0)
Platelets: 320 10*3/uL (ref 150–400)
RBC: 6.1 MIL/uL — ABNORMAL HIGH (ref 3.87–5.11)
RDW: 17.6 % — ABNORMAL HIGH (ref 11.5–15.5)
WBC: 7.4 10*3/uL (ref 4.0–10.5)
nRBC: 0 % (ref 0.0–0.2)

## 2023-06-02 MED ORDER — FUROSEMIDE 40 MG PO TABS
40.0000 mg | ORAL_TABLET | Freq: Every day | ORAL | 0 refills | Status: DC
  Filled 2023-06-02: qty 30, 30d supply, fill #0

## 2023-06-02 MED ORDER — CARVEDILOL 12.5 MG PO TABS
12.5000 mg | ORAL_TABLET | Freq: Two times a day (BID) | ORAL | 0 refills | Status: DC
  Filled 2023-06-02: qty 60, 30d supply, fill #0

## 2023-06-02 MED ORDER — POTASSIUM CHLORIDE CRYS ER 20 MEQ PO TBCR
20.0000 meq | EXTENDED_RELEASE_TABLET | Freq: Every day | ORAL | 1 refills | Status: DC
  Filled 2023-06-02: qty 30, 30d supply, fill #0

## 2023-06-02 MED ORDER — POTASSIUM CHLORIDE CRYS ER 20 MEQ PO TBCR
40.0000 meq | EXTENDED_RELEASE_TABLET | Freq: Every day | ORAL | 1 refills | Status: DC
  Filled 2023-06-02 – 2023-06-26 (×2): qty 180, 90d supply, fill #0
  Filled 2023-10-03: qty 180, 90d supply, fill #1

## 2023-06-02 MED ORDER — LISINOPRIL 20 MG PO TABS
20.0000 mg | ORAL_TABLET | Freq: Every day | ORAL | 0 refills | Status: DC
  Filled 2023-06-02: qty 30, 30d supply, fill #0

## 2023-06-02 NOTE — Progress Notes (Signed)
Discharge instructions provided to patient. All medications, follow up appointments, and discharge instructions provided. IV out. Monitor off CCMD notified. Discharging home.

## 2023-06-02 NOTE — Progress Notes (Addendum)
 Heart Failure Navigator Progress Note  Assessed for Heart & Vascular TOC clinic readiness.   Patient admitted on 5/21 with HFpEF exacerbation and HTN urgency. Last EF >75%. Repeat ECHO this admission with EF 60-65%. Discharging on 5/23 with lasix  40 mg daily, carvedilol 12.5 mg BID, lisinopril  20 mg daily, and KCl 20 mEq daily. HF TOC appt scheduled for 5/30 @ 9:30 per Dr. Drexel Gentles. Information on the appt and appt card reminder provided to the patient. All questions answered. She drives herself to appts.   Jerilyn Monte, PharmD, BCPS Heart Failure Stewardship Pharmacist Phone [phone]

## 2023-06-02 NOTE — TOC Transition Note (Signed)
 Transition of Care [facility]) - Discharge Note   Patient Details  Name: Ashley Warren MRN: [id number] Date of Birth: 1955/01/15  Transition of [facility]) CM/SW Contact:  Jennett Model, RN Phone Number: 06/02/2023, 10:10 AM   Clinical Narrative:    For dc today, has no needs. Has transport.         Patient Goals and CMS Choice            Discharge Placement                       Discharge Plan and Services Additional resources added to the After Visit Summary for                                       Social Drivers of Health (SDOH) Interventions SDOH Screenings   Food Insecurity: No Food Insecurity (05/31/2023)  Housing: Unknown (05/31/2023)  Transportation Needs: No Transportation Needs (06/01/2023)  Utilities: Not At Risk (06/01/2023)  Social Connections: Unknown (06/01/2023)  Tobacco Use: Low Risk  (05/31/2023)     Readmission Risk Interventions     No data to display

## 2023-06-02 NOTE — Discharge Summary (Signed)
 Physician Discharge Summary  Ashley Warren WUJ:[id number] DOB: 19-Sep-1955 DOA: 05/31/2023  PCP: Angelia Kelp, NP  Admit date: 05/31/2023 Discharge date: 06/02/2023  Time spent: 45 minutes  Recommendations for Outpatient Follow-up:  CHF TOC clinic next week, check BMP at follow-up PCP in 1 to 2 weeks, continue to work on GLP-1 approval Consider sleep study   Discharge Diagnoses:  Principal Problem:   Acute diastolic heart failure (HCC) Hypertensive urgency Morbid obesity Borderline diabetes  Discharge Condition: Improved  Diet recommendation: Heart healthy, carb modified  Filed Weights   05/31/23 0427 06/01/23 0517 06/02/23 0415  Weight: (!) 158.5 kg (!) 154 kg (!) 153.1 kg    History of present illness:  67/M with hypertension, hysterectomy, morbid obesity presented to the ED with significantly elevated BP and dizziness. - Developed acute onset dizziness subsequently checked her blood pressure that night it was 210/115, she also noticed some fatigue and mild dyspnea at the time and eventually presented to the ED. - [facility] BP was 217/99, labs noted hypokalemia, creatinine was 0.8, proBNP 36, troponin less than 15, hemoglobin 13.8, chest x-ray noted mild interstitial edema, EKG with moderate LVH, no acute ST-T wave changes  Hospital Course:   Hypertensive urgency Acute diastolic CHF, severe LVH - Presented with dizziness, mild dyspnea, edema and BP >210 - improved drastically after 2 doses of Lasix  - 2D echo noted EF 60-65%, severe LVH, grade 1 DD - Clinically improved and stable, discharged on oral Coreg, in addition to home regimen of amlodipine , lisinopril  -Lasix  40 Mg daily added at discharge -Follow-up in CHF TOC clinic   Hypokalemia - Repleted   Borderline diabetes, hyperglycemia A1 C was 5.7   Morbid obesity, BMI 62.8 - Needs significant diet and lifestyle modification, she is trying to get on GLP-1 agonist therapy but pending approval - Would also  benefit from a sleep study as outpatient  Discharge Exam: Vitals:   06/02/23 0415 06/02/23 0827  BP: (!) 146/56 138/74  Pulse: 74 75  Resp: 20 20  Temp: 98.3 F (36.8 C) 98.3 F (36.8 C)  SpO2: 96% 97%    Discharge Instructions   Discharge Instructions     Diet - low sodium heart healthy   Complete by: As directed    Increase activity slowly   Complete by: As directed       Allergies as of 06/02/2023       Reactions   Penicillins Itching        Medication List     STOP taking these medications    chlorpheniramine-HYDROcodone 10-8 MG/5ML Commonly known as: TUSSIONEX   hydrochlorothiazide  25 MG tablet Commonly known as: HYDRODIURIL    ibuprofen  200 MG tablet Commonly known as: ADVIL    Ozempic  (0.25 or 0.5 MG/DOSE) 2 MG/3ML Sopn Generic drug: Semaglutide (0.25 or 0.5MG /DOS)   potassium chloride  10 MEQ tablet Commonly known as: KLOR-CON        TAKE these medications    albuterol  108 (90 Base) MCG/ACT inhaler Commonly known as: VENTOLIN  HFA Inhale 2 puffs every 6 (six) hours as needed for wheezing (cough). What changed:  how much to take when to take this   albuterol  (2.5 MG/3ML) 0.083% nebulizer solution Commonly known as: PROVENTIL  Inhale 3 mL (2.5 mg total) into the lungs via nebulization every 8 (eight) hours as needed for wheezing or shortness of breath. What changed: reasons to take this   albuterol  108 (90 Base) MCG/ACT inhaler Commonly known as: VENTOLIN  HFA Inhale 2 puffs into the lungs every 6 (  six) hours as needed. What changed: Another medication with the same name was changed. Make sure you understand how and when to take each.   amLODipine  10 MG tablet Commonly known as: NORVASC  Take 1 tablet (10 mg total) by mouth daily.   B COMPLEX PO Take 1 tablet by mouth daily.   BACILLUS COAGULANS-INULIN PO Take 1 tablet by mouth daily.   Black Cohosh 160 MG Caps Take 160 mg by mouth daily.   BOSWELLIA PO Take 375 mg by mouth  daily.   budesonide -formoterol  160-4.5 MCG/ACT inhaler Commonly known as: SYMBICORT  Inhale 2 puffs into the lungs in the morning and at bedtime.   carvedilol 12.5 MG tablet Commonly known as: COREG Take 1 tablet (12.5 mg total) by mouth 2 (two) times daily with a meal.   diclofenac  Sodium 1 % Gel Commonly known as: VOLTAREN  APPLY 4 GRAMS 3 TIMES DAILY TO AFFECTED AREA(S) What changed:  how much to take when to take this   ergocalciferol 1.25 MG (50000 UT) capsule Commonly known as: VITAMIN D2 Take 50,000 Units by mouth once a week.   fluticasone  50 MCG/ACT nasal spray Commonly known as: FLONASE  Administer 2 sprays in each nostril daily. What changed:  when to take this reasons to take this   furosemide  40 MG tablet Commonly known as: Lasix  Take 1 tablet (40 mg total) by mouth daily.   lisinopril  20 MG tablet Commonly known as: ZESTRIL  Take 1 tablet (20 mg total) by mouth daily. What changed:  medication strength how much to take when to take this   Milk Thistle 150 MG Caps Take 150 mg by mouth daily.   Nyamyc  powder Generic drug: nystatin  USE TWICE A DAY FOR 10 DAYS THEN AS NEEDED What changed:  how much to take when to take this   OVER THE COUNTER MEDICATION Take 2 capsules by mouth daily. FISH OIL OMEGA 3 VIT C VIT E 2000-650-12MG    pantoprazole  40 MG tablet Commonly known as: PROTONIX  Take 1 tablet (40 mg total) by mouth daily.   potassium chloride  SA 20 MEQ tablet Commonly known as: KLOR-CON  M Take 1 tablet (20 mEq total) by mouth daily.   pravastatin  20 MG tablet Commonly known as: PRAVACHOL  TAKE 1 TABLET BY MOUTH EVERY EVENING What changed: how much to take   prednisoLONE  acetate 1 % ophthalmic suspension Commonly known as: PRED FORTE  Place 1 drop into the right eye 4 (four) times daily.   Spacer/Aero-[employer]olding [employer] Use as directed   traMADol  50 MG tablet Commonly known as: ULTRAM  Take 1 tablet (50 mg total) by mouth every 6  (six) hours as needed for moderate pain (4-6). What changed: when to take this   traZODone  50 MG tablet Commonly known as: DESYREL  Take 1 tablet (50 mg total) by mouth at bedtime. *stop zolpidem * What changed:  when to take this reasons to take this additional instructions   Vevye 0.1 % Soln Generic drug: cycloSPORINE Place 1 drop into both eyes 2 (two) times daily.   Vitamin C CR 500 MG Cpcr Take 500 mg by mouth daily.   Wegovy  0.25 MG/0.5ML Soaj Generic drug: Semaglutide -Weight Management Inject 0.25 mg into the skin every 7 (seven) days. What changed: Another medication with the same name was removed. Continue taking this medication, and follow the directions you see here.   Wegovy  0.25 MG/0.5ML Soaj Generic drug: Semaglutide -Weight Management Inject 0.25 mg into the skin every 7 days What changed: Another medication with the same name was removed. Continue  taking this medication, and follow the directions you see here.   Wegovy  0.25 MG/0.5ML Soaj Generic drug: Semaglutide -Weight Management Inject 0.25 mg into the skin every 7 (seven) days. What changed: Another medication with the same name was removed. Continue taking this medication, and follow the directions you see here.   zolpidem  6.25 MG CR tablet Commonly known as: AMBIEN  CR Take 1 tablet (6.25 mg total) by mouth at bedtime as needed for sleep       Allergies  Allergen Reactions   Penicillins Itching    Follow-up Information     Angelia Kelp, NP Follow up.   Specialty: Nurse Practitioner Why: GO: JUNE 4 AT 9:40AM [phone]) Contact information: [address] city [place] [address] [phone]                  The results of significant diagnostics from this hospitalization (including imaging, microbiology, ancillary and laboratory) are listed below for reference.    Significant Diagnostic Studies: ECHOCARDIOGRAM COMPLETE BUBBLE STUDY Result Date: 06/01/2023     ECHOCARDIOGRAM REPORT   Patient Name:   JADIN KAGEL Date of Exam: 06/01/2023 Medical Rec #:  [id number]           Height:       62.0 in Accession #:    [id number]          Weight:       339.4 lb Date of Birth:  1955/12/14           BSA:          2.393 m Patient Age:    67 years            BP:           145/69 mmHg Patient Gender: F                   HR:           73 bpm. Exam Location:  Inpatient Procedure: 2D Echo, Cardiac Doppler, Color Doppler, Intracardiac Opacification            Agent and Saline Contrast Bubble Study (Both Spectral and Color Flow            Doppler were utilized during procedure). Indications:    Dyspnea  History:        Patient has prior history of Echocardiogram examinations, most                 recent 05/20/2020.  Sonographer:    Hersey Lorenzo RDCS Referring Phys: [id number] Arne Langdon IMPRESSIONS  1. Left ventricular ejection fraction, by estimation, is 60 to 65%. The left ventricle has normal function. The left ventricle has no regional wall motion abnormalities. There is severe left ventricular hypertrophy. Left ventricular diastolic parameters  are consistent with Grade I diastolic dysfunction (impaired relaxation).  2. Right ventricular systolic function is normal. The right ventricular size is normal.  3. The mitral valve is normal in structure. No evidence of mitral valve regurgitation. No evidence of mitral stenosis.  4. The aortic valve is normal in structure. Aortic valve regurgitation is not visualized. No aortic stenosis is present.  5. The inferior vena cava is dilated in size with >50% respiratory variability, suggesting right atrial pressure of 8 mmHg.  6. Agitated saline contrast bubble study was negative, with no evidence of any interatrial shunt. FINDINGS  Left Ventricle: Left ventricular ejection fraction, by estimation, is 60 to 65%. The left ventricle has normal function.  The left ventricle has no regional wall motion abnormalities. Definity  contrast agent was  given IV to delineate the left ventricular  endocardial borders. The left ventricular internal cavity size was normal in size. There is severe left ventricular hypertrophy. Left ventricular diastolic parameters are consistent with Grade I diastolic dysfunction (impaired relaxation). Right Ventricle: The right ventricular size is normal. No increase in right ventricular wall thickness. Right ventricular systolic function is normal. Left Atrium: Left atrial size was normal in size. Right Atrium: Right atrial size was normal in size. Pericardium: There is no evidence of pericardial effusion. Mitral Valve: The mitral valve is normal in structure. No evidence of mitral valve regurgitation. No evidence of mitral valve stenosis. Tricuspid Valve: The tricuspid valve is normal in structure. Tricuspid valve regurgitation is not demonstrated. No evidence of tricuspid stenosis. Aortic Valve: The aortic valve is normal in structure. Aortic valve regurgitation is not visualized. No aortic stenosis is present. Pulmonic Valve: The pulmonic valve was normal in structure. Pulmonic valve regurgitation is not visualized. No evidence of pulmonic stenosis. Aorta: The aortic root is normal in size and structure. Venous: The inferior vena cava is dilated in size with greater than 50% respiratory variability, suggesting right atrial pressure of 8 mmHg. IAS/Shunts: No atrial level shunt detected by color flow Doppler. Agitated saline contrast was given intravenously to evaluate for intracardiac shunting. Agitated saline contrast bubble study was negative, with no evidence of any interatrial shunt.  LEFT VENTRICLE PLAX 2D LVIDd:         3.50 cm   Diastology LVIDs:         2.90 cm   LV e' medial:    3.05 cm/s LV PW:         1.50 cm   LV E/e' medial:  24.7 LV IVS:        1.60 cm   LV e' lateral:   6.20 cm/s LVOT diam:     2.00 cm   LV E/e' lateral: 12.1 LV SV:         110 LV SV Index:   46 LVOT Area:     3.14 cm  RIGHT VENTRICLE              IVC RV S prime:     11.50 cm/s  IVC diam: 2.10 cm TAPSE (M-mode): 2.1 cm LEFT ATRIUM             Index        RIGHT ATRIUM          Index LA diam:        2.50 cm 1.04 cm/m   RA Area:     9.24 cm LA Vol (A2C):   41.6 ml 17.38 ml/m  RA Volume:   15.60 ml 6.52 ml/m LA Vol (A4C):   69.0 ml 28.83 ml/m LA Biplane Vol: 55.8 ml 23.31 ml/m  AORTIC VALVE LVOT Vmax:   173.00 cm/s LVOT Vmean:  134.000 cm/s LVOT VTI:    0.350 m  AORTA Ao Root diam: 2.70 cm Ao Asc diam:  3.60 cm MITRAL VALVE MV Area (PHT): 2.27 cm     SHUNTS MV Decel Time: 334 msec     Systemic VTI:  0.35 m MV E velocity: 75.20 cm/s   Systemic Diam: 2.00 cm MV A velocity: 107.00 cm/s MV E/A ratio:  0.70 Aditya Sabharwal Electronically signed by Alwin Baars Signature Date/Time: 06/01/2023/10:57:07 AM    Final    CT Head Wo Contrast Result Date: 05/31/2023 CLINICAL DATA:  Syncope with cerebrovascular injury suspected. EXAM: CT HEAD WITHOUT CONTRAST TECHNIQUE: Contiguous axial images were obtained from the base of the skull through the vertex without intravenous contrast. RADIATION DOSE REDUCTION: This exam was performed according to the departmental dose-optimization program which includes automated exposure control, adjustment of the mA and/or kV according to patient size and/or use of iterative reconstruction technique. COMPARISON:  05/20/2020 FINDINGS: Brain: No evidence of acute infarction, hemorrhage, hydrocephalus, extra-axial collection or mass lesion/mass effect. Superior vermian atrophy, stable. Vascular: No hyperdense vessel or unexpected calcification. Skull: Normal. Negative for fracture or focal lesion. Sinuses/Orbits: No acute finding. Other: None. IMPRESSION: No acute or interval finding. Electronically Signed   By: Ronnette Coke M.D.   On: 05/31/2023 06:18   DG Chest Portable 1 View Result Date: 05/31/2023 CLINICAL DATA:  Heart racing and hypertension EXAM: PORTABLE CHEST 1 VIEW COMPARISON:  02/04/2023 FINDINGS: Mild cardiac  enlargement. Interstitial coarsening which is similar to prior. No air bronchogram, effusion, or pneumothorax. Artifact from EKG leads. No acute osseous finding. IMPRESSION: Borderline heart size which generalized interstitial coarsening favoring mild edema. Electronically Signed   By: Ronnette Coke M.D.   On: 05/31/2023 05:18    Microbiology: No results found for this or any previous visit (from the past 240 hours).   Labs: Basic Metabolic Panel: Recent Labs  Lab 05/31/23 0451 05/31/23 1250 06/01/23 0931 06/02/23 0245  NA 140  --  137 136  K 2.9*  --  3.4* 3.3*  CL 99  --  99 101  CO2 30  --  24 27  GLUCOSE 138*  --  113* 120*  BUN 16  --  11 15  CREATININE 0.86 0.71 0.58 0.67  CALCIUM  9.7  --  10.0 10.1   Liver Function Tests: No results for input(s): "AST", "ALT", "ALKPHOS", "BILITOT", "PROT", "ALBUMIN" in the last 168 hours. No results for input(s): "LIPASE", "AMYLASE" in the last 168 hours. No results for input(s): "AMMONIA" in the last 168 hours. CBC: Recent Labs  Lab 05/31/23 0451 05/31/23 1250 06/02/23 0245  WBC 8.8 8.2 7.4  NEUTROABS 4.0  --   --   HGB 13.8 13.9 15.4*  HCT 41.9 41.8 46.9*  MCV 77.4* 76.7* 76.9*  PLT 282 283 320   Cardiac Enzymes: No results for input(s): "CKTOTAL", "CKMB", "CKMBINDEX", "TROPONINI" in the last 168 hours. BNP: BNP (last 3 results) Recent Labs    02/04/23 0840  BNP 43.1    ProBNP (last 3 results) Recent Labs    05/31/23 0451  PROBNP <36.0    CBG: No results for input(s): "GLUCAP" in the last 168 hours.     Signed:  Deforest Fast MD.  [facility] 06/02/2023, 10:23 AM

## 2023-06-08 ENCOUNTER — Telehealth (HOSPITAL_COMMUNITY): Payer: Self-pay

## 2023-06-08 NOTE — Progress Notes (Incomplete)
 HEART & VASCULAR TRANSITION OF CARE CONSULT NOTE   Referring Physician: Dr. Drexel Gentles PCP: Angelia Kelp, NP   HPI: Referred to clinic by Dr. Drexel Gentles for heart failure consultation.   Ashley Warren is a 68 y.o. female medical history significant of HTN, s/p hysterectomy.  Admitted 5/25 with a/c dHF exacerbation and HTN urgency, BP 219/99. Last EF >75%. Echo this admission with EF 60-65%, G1DD, normal RV. Given a dose of IV lasix , BP managed with hydrochlorothiazide , coreg and amlodipine . She was discharged home, weight 337 lbs.  Today she presents to [facility] for post hospital follow up. Overall feeling fine. Denies increasing SOB, palpitations, abnormal bleeding, CP, dizziness, edema, or PND/Orthopnea. Appetite ok. No fever or chills. Weight at home 170 pounds. Taking all medications.   Family Hx: HTN in sister and mother Social Hx:   Cardiac Testing  - Echo 5/25: EF 60-65%, G1DD, normal RV.  - Echo 5/22: EF > 75%, moderate LVH, G1DD,    Past Medical History:  Diagnosis Date   Hypertension     Current Outpatient Medications  Medication Sig Dispense Refill   albuterol  (PROVENTIL ) (2.5 MG/3ML) 0.083% nebulizer solution Inhale 3 mL (2.5 mg total) into the lungs via nebulization every 8 (eight) hours as needed for wheezing or shortness of breath. (Patient taking differently: Take 2.5 mg by nebulization every 8 (eight) hours as needed for wheezing or shortness of breath.) 180 mL 0   albuterol  (VENTOLIN  HFA) 108 (90 Base) MCG/ACT inhaler Inhale 2 puffs every 6 (six) hours as needed for wheezing (cough). (Patient taking differently: Inhale 2 puffs into the lungs in the morning and at bedtime.) 6.7 g 1   albuterol  (VENTOLIN  HFA) 108 (90 Base) MCG/ACT inhaler Inhale 2 puffs into the lungs every 6 (six) hours as needed. 6.7 g 1   amLODipine  (NORVASC ) 10 MG tablet Take 1 tablet (10 mg total) by mouth daily. 90 tablet 1   Ascorbic Acid  (VITAMIN C CR) 500 MG CPCR Take 500 mg by mouth  daily.     B Complex Vitamins (B COMPLEX PO) Take 1 tablet by mouth daily.     BACILLUS COAGULANS-INULIN PO Take 1 tablet by mouth daily.     Black Cohosh 160 MG CAPS Take 160 mg by mouth daily.     Boswellia Serrata (BOSWELLIA PO) Take 375 mg by mouth daily.     budesonide -formoterol  (SYMBICORT ) 160-4.5 MCG/ACT inhaler Inhale 2 puffs into the lungs in the morning and at bedtime. 40.8 g 1   carvedilol (COREG) 12.5 MG tablet Take 1 tablet (12.5 mg total) by mouth 2 (two) times daily with a meal. 60 tablet 0   diclofenac  Sodium (VOLTAREN ) 1 % GEL APPLY 4 GRAMS 3 TIMES DAILY TO AFFECTED AREA(S) (Patient taking differently: Apply 1 Application topically in the morning and at bedtime.) 500 g 2   ergocalciferol (VITAMIN D2) 1.25 MG (50000 UT) capsule Take 50,000 Units by mouth once a week.     fluticasone  (FLONASE ) 50 MCG/ACT nasal spray Administer 2 sprays in each nostril daily. (Patient taking differently: Place 2 sprays into both nostrils as needed for allergies.) 48 g 0   furosemide  (LASIX ) 40 MG tablet Take 1 tablet (40 mg total) by mouth daily. 30 tablet 0   lisinopril  (ZESTRIL ) 20 MG tablet Take 1 tablet (20 mg total) by mouth daily. 30 tablet 0   Milk Thistle 150 MG CAPS Take 150 mg by mouth daily.     nystatin  (NYAMYC ) powder USE TWICE  A DAY FOR 10 DAYS THEN AS NEEDED (Patient taking differently: Apply 1 Application topically daily.) 120 g 1   OVER THE COUNTER MEDICATION Take 2 capsules by mouth daily. FISH OIL OMEGA 3 VIT C VIT E 2000-650-12MG      pantoprazole  (PROTONIX ) 40 MG tablet Take 1 tablet (40 mg total) by mouth daily. 90 tablet 0   potassium chloride  SA (KLOR-CON  M) 20 MEQ tablet Take 1 tablet (20 mEq total) by mouth daily. 30 tablet 1   potassium chloride  SA (KLOR-CON  M) 20 MEQ tablet Take 2 tablets (40 mEq total) by mouth daily. 180 tablet 1   pravastatin  (PRAVACHOL ) 20 MG tablet TAKE 1 TABLET BY MOUTH EVERY EVENING (Patient taking differently: Take 20 mg by mouth every evening.) 90  tablet 2   prednisoLONE  acetate (PRED FORTE ) 1 % ophthalmic suspension Place 1 drop into the right eye 4 (four) times daily. (Patient not taking: Reported on 05/31/2023) 10 mL 0   Semaglutide -Weight Management (WEGOVY ) 0.25 MG/0.5ML SOAJ Inject 0.25 mg into the skin every 7 (seven) days. 2 mL 0   Semaglutide -Weight Management (WEGOVY ) 0.25 MG/0.5ML SOAJ Inject 0.25 mg into the skin every 7 days 2 mL 0   Semaglutide -Weight Management (WEGOVY ) 0.25 MG/0.5ML SOAJ Inject 0.25 mg into the skin every 7 (seven) days. 2 mL 0   Spacer/Aero-Holding [facility] Use as directed 1 each 1   traMADol  (ULTRAM ) 50 MG tablet Take 1 tablet (50 mg total) by mouth every 6 (six) hours as needed for moderate pain (4-6). (Patient taking differently: Take 50 mg by mouth 2 (two) times daily.) 120 tablet 0   traZODone  (DESYREL ) 50 MG tablet Take 1 tablet (50 mg total) by mouth at bedtime. *stop zolpidem * (Patient taking differently: Take 50 mg by mouth at bedtime as needed for sleep.) 30 tablet 3   VEVYE 0.1 % SOLN Place 1 drop into both eyes 2 (two) times daily.     zolpidem  (AMBIEN  CR) 6.25 MG CR tablet Take 1 tablet (6.25 mg total) by mouth at bedtime as needed for sleep 30 tablet 0   No current facility-administered medications for this visit.    Allergies  Allergen Reactions   Penicillins Itching   Social History   Socioeconomic History   Marital status: Divorced    Spouse name: Not on file   Number of children: Not on file   Years of education: Not on file   Highest education level: Not on file  Occupational History   Not on file  Tobacco Use   Smoking status: Never   Smokeless tobacco: Never  Vaping Use   Vaping status: Never Used  Substance and Sexual Activity   Alcohol  use: Yes    Comment: ocially   Drug use: No   Sexual activity: Not on file  Other Topics Concern   Not on file  Social History Narrative   Not on file   Social Drivers of Health   Financial Resource Strain: Not on file   Food Insecurity: No Food Insecurity (05/31/2023)   Hunger Vital Sign    Worried About Running Out of Food in the Last Year: Never true    Ran Out of Food in the Last Year: Never true  Transportation Needs: No Transportation Needs (06/01/2023)   PRAPARE - Administrator, Civil Service (Medical): No    Lack of Transportation (Non-Medical): No  Physical Activity: Not on file  Stress: Not on file  Social Connections: Unknown (06/01/2023)   Social Connection and  Isolation Panel [NHANES]    Frequency of Communication with Friends and Family: More than three times a week    Frequency of Social Gatherings with Friends and Family: Not on file    Attends Religious Services: Not on file    Active Member of Clubs or Organizations: Not on file    Attends Banker Meetings: Not on file    Marital Status: Not on file  Intimate Partner Violence: Not At Risk (06/01/2023)   Humiliation, Afraid, Rape, and Kick questionnaire    Fear of Current or Ex-Partner: No    Emotionally Abused: No    Physically Abused: No    Sexually Abused: No      Family History  Problem Relation Age of Onset   Alcohol  abuse Maternal Grandmother    Autism spectrum disorder Sister    Breast cancer Mother    Depression Sister    Hypertension Mother    Hypertension Sister    Osteoarthritis Maternal Grandmother     There were no vitals filed for this visit.  PHYSICAL EXAM: General:  NAD. No resp difficulty HEENT: Normal Neck: Supple. No JVD. Cor: Regular rate & rhythm. No rubs, gallops or murmurs. Lungs: Clear Abdomen: Soft, nontender, nondistended.  Extremities: No cyanosis, clubbing, rash, edema Neuro: Alert & oriented x 3, moves all 4 extremities w/o difficulty. Affect pleasant.  ECG (personally reviewed):   ASSESSMENT & PLAN: Chronic Diastolic Heart Failure - Echo 2/44: EF 60-65%, G1DD, normal RV - Needs aggressive BP control - NYHA, volume - ? Role of Coreg in dHF but will continue  for now - Increase lisinoprol to 40 mg daily - Continue Lasix  40 mg daily, drop KCL supp  HTN - consider renal dopplers - BP better controlled, ? Amb BP monitor? - Continue amlodipine  10 mg daily. - ? Coreg in diastolic HF  Obesity - There is no height or weight on file to calculate BMI. - ? GLP1   NYHA *** GDMT  Diuretic- BB- Ace/ARB/ARNI MRA SGLT2i    Referred to HFSW (PCP, Medications, Transportation, ETOH Abuse, Drug Abuse, Insurance, Financial ): No Refer to Pharmacy: No Refer to Home Health: No Refer to Advanced [facility] Failure Clinic: No Refer to General Cardiology: Yes, refer to [facility]  Follow up

## 2023-06-08 NOTE — Telephone Encounter (Signed)
 Called to confirm/remind patient of their appointment at the Advanced [facility] Failure Clinic on 06/09/2023 9:30.   Appointment:   [] Confirmed  [x] Left mess   [] No answer/No voice mail  [] VM Full/unable to leave message  [] Phone not in service  Patient reminded to bring all medications and/or complete list.  Confirmed patient has transportation. Gave directions, instructed to utilize valet parking.

## 2023-06-09 ENCOUNTER — Encounter (HOSPITAL_COMMUNITY)

## 2023-06-15 ENCOUNTER — Other Ambulatory Visit: Payer: Self-pay

## 2023-06-15 ENCOUNTER — Other Ambulatory Visit (HOSPITAL_COMMUNITY): Payer: Self-pay

## 2023-06-15 DIAGNOSIS — E7800 Pure hypercholesterolemia, unspecified: Secondary | ICD-10-CM | POA: Diagnosis not present

## 2023-06-15 DIAGNOSIS — E876 Hypokalemia: Secondary | ICD-10-CM | POA: Diagnosis not present

## 2023-06-15 DIAGNOSIS — J069 Acute upper respiratory infection, unspecified: Secondary | ICD-10-CM | POA: Diagnosis not present

## 2023-06-15 DIAGNOSIS — R062 Wheezing: Secondary | ICD-10-CM | POA: Diagnosis not present

## 2023-06-15 DIAGNOSIS — Z7951 Long term (current) use of inhaled steroids: Secondary | ICD-10-CM | POA: Diagnosis not present

## 2023-06-15 DIAGNOSIS — R7303 Prediabetes: Secondary | ICD-10-CM | POA: Diagnosis not present

## 2023-06-15 DIAGNOSIS — I509 Heart failure, unspecified: Secondary | ICD-10-CM | POA: Diagnosis not present

## 2023-06-15 DIAGNOSIS — Z6841 Body Mass Index (BMI) 40.0 and over, adult: Secondary | ICD-10-CM | POA: Diagnosis not present

## 2023-06-15 DIAGNOSIS — I110 Hypertensive heart disease with heart failure: Secondary | ICD-10-CM | POA: Diagnosis not present

## 2023-06-15 DIAGNOSIS — I10 Essential (primary) hypertension: Secondary | ICD-10-CM | POA: Diagnosis not present

## 2023-06-15 DIAGNOSIS — I5031 Acute diastolic (congestive) heart failure: Secondary | ICD-10-CM | POA: Diagnosis not present

## 2023-06-15 MED ORDER — ALBUTEROL SULFATE HFA 108 (90 BASE) MCG/ACT IN AERS
2.0000 | INHALATION_SPRAY | Freq: Four times a day (QID) | RESPIRATORY_TRACT | 1 refills | Status: AC | PRN
  Filled 2023-06-15: qty 6.7, 25d supply, fill #0

## 2023-06-15 MED ORDER — OLMESARTAN MEDOXOMIL 20 MG PO TABS
20.0000 mg | ORAL_TABLET | Freq: Every day | ORAL | 3 refills | Status: DC
  Filled 2023-06-15: qty 30, 30d supply, fill #0
  Filled 2023-07-10: qty 30, 30d supply, fill #1
  Filled 2023-08-16: qty 30, 30d supply, fill #2
  Filled 2023-09-04: qty 30, 30d supply, fill #3

## 2023-06-19 ENCOUNTER — Other Ambulatory Visit (HOSPITAL_COMMUNITY): Payer: Self-pay

## 2023-06-21 ENCOUNTER — Other Ambulatory Visit (HOSPITAL_COMMUNITY): Payer: Self-pay

## 2023-06-22 ENCOUNTER — Other Ambulatory Visit (HOSPITAL_COMMUNITY): Payer: Self-pay

## 2023-06-22 MED ORDER — TRAMADOL HCL 50 MG PO TABS
50.0000 mg | ORAL_TABLET | Freq: Four times a day (QID) | ORAL | 0 refills | Status: DC | PRN
  Filled 2023-06-22: qty 120, 30d supply, fill #0

## 2023-06-23 ENCOUNTER — Other Ambulatory Visit (HOSPITAL_COMMUNITY): Payer: Self-pay

## 2023-06-23 ENCOUNTER — Other Ambulatory Visit: Payer: Self-pay

## 2023-06-25 NOTE — Progress Notes (Incomplete)
 HEART & VASCULAR TRANSITION OF CARE CONSULT NOTE     Referring Physician: Dr. Drexel Gentles PCP: Angelia Kelp, NP   Chief Complaint: Recently diagnosed diastolic heart failure  HPI: Referred to clinic by Dr. Drexel Gentles for heart failure consultation.   Ashley Warren is a 68 y.o. female with recently diagnosed diastolic heart failure, hypertension and morbid obesity.  She was recently seen in the ED with high blood pressure and dizziness.  SBP noted to be 210/115.  Echo showed EF 60 to 65%, severe LVH, G1 DD.  Responded very well to IV Lasix .  Some GDMT was started but focus was mostly on blood pressure reduction.  Today she presents for AHF [facility] clinic visit. Overall feeling ***. Denies palpitations, CP, dizziness, edema, or PND/Orthopnea. *** SOB. Appetite ok. No fever or chills. Weight at home *** pounds. Taking all medications. Denies ETOH, tobacco or drug use.  Family history ***   Past Medical History:  Diagnosis Date   Hypertension     Current Outpatient Medications  Medication Sig Dispense Refill   albuterol  (PROVENTIL ) (2.5 MG/3ML) 0.083% nebulizer solution Inhale 3 mL (2.5 mg total) into the lungs via nebulization every 8 (eight) hours as needed for wheezing or shortness of breath. (Patient taking differently: Take 2.5 mg by nebulization every 8 (eight) hours as needed for wheezing or shortness of breath.) 180 mL 0   albuterol  (VENTOLIN  HFA) 108 (90 Base) MCG/ACT inhaler Inhale 2 puffs every 6 (six) hours as needed for wheezing (cough). (Patient taking differently: Inhale 2 puffs into the lungs in the morning and at bedtime.) 6.7 g 1   albuterol  (VENTOLIN  HFA) 108 (90 Base) MCG/ACT inhaler Inhale 2 puffs into the lungs every 6 (six) hours as needed. 6.7 g 1   albuterol  (VENTOLIN  HFA) 108 (90 Base) MCG/ACT inhaler Inhale 2 puffs into the lungs every 6 (six) hours as needed for wheezing (cough). 6.7 g 1   amLODipine  (NORVASC ) 10 MG tablet Take 1 tablet (10 mg total) by mouth  daily. 90 tablet 1   Ascorbic Acid  (VITAMIN C CR) 500 MG CPCR Take 500 mg by mouth daily.     B Complex Vitamins (B COMPLEX PO) Take 1 tablet by mouth daily.     BACILLUS COAGULANS-INULIN PO Take 1 tablet by mouth daily.     Black Cohosh 160 MG CAPS Take 160 mg by mouth daily.     Boswellia Serrata (BOSWELLIA PO) Take 375 mg by mouth daily.     budesonide -formoterol  (SYMBICORT ) 160-4.5 MCG/ACT inhaler Inhale 2 puffs into the lungs in the morning and at bedtime. 40.8 g 1   carvedilol  (COREG ) 12.5 MG tablet Take 1 tablet (12.5 mg total) by mouth 2 (two) times daily with a meal. 60 tablet 0   diclofenac  Sodium (VOLTAREN ) 1 % GEL APPLY 4 GRAMS 3 TIMES DAILY TO AFFECTED AREA(S) (Patient taking differently: Apply 1 Application topically in the morning and at bedtime.) 500 g 2   ergocalciferol (VITAMIN D2) 1.25 MG (50000 UT) capsule Take 50,000 Units by mouth once a week.     fluticasone  (FLONASE ) 50 MCG/ACT nasal spray Administer 2 sprays in each nostril daily. (Patient taking differently: Place 2 sprays into both nostrils as needed for allergies.) 48 g 0   furosemide  (LASIX ) 40 MG tablet Take 1 tablet (40 mg total) by mouth daily. 30 tablet 0   lisinopril  (ZESTRIL ) 20 MG tablet Take 1 tablet (20 mg total) by mouth daily. 30 tablet 0   Milk  Thistle 150 MG CAPS Take 150 mg by mouth daily.     nystatin  (NYAMYC ) powder USE TWICE A DAY FOR 10 DAYS THEN AS NEEDED (Patient taking differently: Apply 1 Application topically daily.) 120 g 1   olmesartan  (BENICAR ) 20 MG tablet Take 1 tablet (20 mg total) by mouth daily. 30 tablet 3   OVER THE COUNTER MEDICATION Take 2 capsules by mouth daily. FISH OIL OMEGA 3 VIT C VIT E 2000-650-12MG      pantoprazole  (PROTONIX ) 40 MG tablet Take 1 tablet (40 mg total) by mouth daily. 90 tablet 0   potassium chloride  SA (KLOR-CON  M) 20 MEQ tablet Take 1 tablet (20 mEq total) by mouth daily. 30 tablet 1   potassium chloride  SA (KLOR-CON  M) 20 MEQ tablet Take 2 tablets (40 mEq  total) by mouth daily. 180 tablet 1   pravastatin  (PRAVACHOL ) 20 MG tablet TAKE 1 TABLET BY MOUTH EVERY EVENING (Patient taking differently: Take 20 mg by mouth every evening.) 90 tablet 2   prednisoLONE  acetate (PRED FORTE ) 1 % ophthalmic suspension Place 1 drop into the right eye 4 (four) times daily. (Patient not taking: Reported on 05/31/2023) 10 mL 0   Semaglutide -Weight Management (WEGOVY ) 0.25 MG/0.5ML SOAJ Inject 0.25 mg into the skin every 7 (seven) days. 2 mL 0   Semaglutide -Weight Management (WEGOVY ) 0.25 MG/0.5ML SOAJ Inject 0.25 mg into the skin every 7 days 2 mL 0   Semaglutide -Weight Management (WEGOVY ) 0.25 MG/0.5ML SOAJ Inject 0.25 mg into the skin every 7 (seven) days. 2 mL 0   Spacer/Aero-Holding [facility] Use as directed 1 each 1   traMADol  (ULTRAM ) 50 MG tablet Take 1 tablet (50 mg total) by mouth every 6 (six) hours as needed for moderate pain (4-6). 120 tablet 0   traZODone  (DESYREL ) 50 MG tablet Take 1 tablet (50 mg total) by mouth at bedtime. *stop zolpidem * (Patient taking differently: Take 50 mg by mouth at bedtime as needed for sleep.) 30 tablet 3   VEVYE 0.1 % SOLN Place 1 drop into both eyes 2 (two) times daily.     zolpidem  (AMBIEN  CR) 6.25 MG CR tablet Take 1 tablet (6.25 mg total) by mouth at bedtime as needed for sleep 30 tablet 0   No current facility-administered medications for this visit.   Allergies  Allergen Reactions   Penicillins Itching    Social History   Socioeconomic History   Marital status: Divorced    Spouse name: Not on file   Number of children: Not on file   Years of education: Not on file   Highest education level: Not on file  Occupational History   Not on file  Tobacco Use   Smoking status: Never   Smokeless tobacco: Never  Vaping Use   Vaping status: Never Used  Substance and Sexual Activity   Alcohol  use: Yes    Comment: ocially   Drug use: No   Sexual activity: Not on file  Other Topics Concern   Not on file  Social  History Narrative   Not on file   Social Drivers of Health   Financial Resource Strain: Not on file  Food Insecurity: No Food Insecurity (05/31/2023)   Hunger Vital Sign    Worried About Running Out of Food in the Last Year: Never true    Ran Out of Food in the Last Year: Never true  Transportation Needs: No Transportation Needs (06/01/2023)   PRAPARE - Administrator, Civil Service (Medical): No  Lack of Transportation (Non-Medical): No  Physical Activity: Not on file  Stress: Not on file  Social Connections: Unknown (06/01/2023)   Social Connection and Isolation Panel    Frequency of Communication with Friends and Family: More than three times a week    Frequency of Social Gatherings with Friends and Family: Not on file    Attends Religious Services: Not on file    Active Member of Clubs or Organizations: Not on file    Attends Banker Meetings: Not on file    Marital Status: Not on file  Intimate Partner Violence: Not At Risk (06/01/2023)   Humiliation, Afraid, Rape, and Kick questionnaire    Fear of Current or Ex-Partner: No    Emotionally Abused: No    Physically Abused: No    Sexually Abused: No    Family History  Problem Relation Age of Onset   Alcohol  abuse Maternal Grandmother    Autism spectrum disorder Sister    Breast cancer Mother    Depression Sister    Hypertension Mother    Hypertension Sister    Osteoarthritis Maternal Grandmother     There were no vitals filed for this visit.  PHYSICAL EXAM: General:  *** appearing.  No respiratory difficulty HEENT: normal Neck: supple. JVD *** cm.  Cor: PMI nondisplaced. Regular rate & rhythm. No rubs, gallops or murmurs. Lungs: clear Abdomen: soft, nontender, nondistended. Good bowel sounds. Extremities: no cyanosis, clubbing, rash, edema  Neuro: alert & oriented x 3. Moves all 4 extremities w/o difficulty. Affect pleasant.   ECG: *** (Personally reviewed)     ASSESSMENT &  PLAN: Diastolic heart failure -Echo 5/22 EF >75%, LV with no RWMA, moderate LVH, G1 DD, RV normal - Echo 5/25 EF 60 to 65%, LV with no RWMA, severe LVH, G1 DD, RV normal, no MR -Suspect hypertensive cardiomyopathy.  Cannot rule out CAD. NYHA *** GDMT  Diuretic-continue Lasix  40 mg daily BB-continue Coreg  12.5 mg twice daily Ace/ARB/ARNI - Continue lisinopril  20 mg daily ??? - Continue olmesartan  20 mg daily *** MRA SGLT2i ***start with hypokalemia recently -Would benefit from RHC +/- L.  Hypertension -BP *** -Continue amlodipine  10 mg daily -Continue Coreg  12.5 mg twice daily - Continue lisinopril  20 mg daily ??? - Continue olmesartan  20 mg daily  Morbid obesity - There is no height or weight on file to calculate BMI.  -Continue Wegovy  for weight loss - Needs sleep study   Referred to HFSW (PCP, Medications, Transportation, ETOH Abuse, Drug Abuse, Insurance, Financial ): Yes or No Refer to Pharmacy: Yes or No Refer to Home Health: Yes on No Refer to Advanced Heart Failure Clinic: Yes or no  Refer to General Cardiology: Yes or No  Follow up

## 2023-06-26 ENCOUNTER — Other Ambulatory Visit (HOSPITAL_COMMUNITY): Payer: Self-pay

## 2023-06-26 ENCOUNTER — Encounter (HOSPITAL_COMMUNITY)

## 2023-06-26 MED ORDER — FUROSEMIDE 40 MG PO TABS
40.0000 mg | ORAL_TABLET | Freq: Every day | ORAL | 0 refills | Status: DC
  Filled 2023-06-26: qty 30, 30d supply, fill #0

## 2023-06-27 ENCOUNTER — Other Ambulatory Visit (HOSPITAL_COMMUNITY): Payer: Self-pay

## 2023-06-27 ENCOUNTER — Other Ambulatory Visit: Payer: Self-pay

## 2023-06-29 ENCOUNTER — Other Ambulatory Visit (HOSPITAL_COMMUNITY): Payer: Self-pay

## 2023-07-03 ENCOUNTER — Other Ambulatory Visit (HOSPITAL_COMMUNITY): Payer: Self-pay

## 2023-07-06 ENCOUNTER — Other Ambulatory Visit (HOSPITAL_COMMUNITY): Payer: Self-pay

## 2023-07-07 ENCOUNTER — Other Ambulatory Visit: Payer: Self-pay

## 2023-07-07 ENCOUNTER — Other Ambulatory Visit (HOSPITAL_COMMUNITY): Payer: Self-pay

## 2023-07-07 MED ORDER — CARVEDILOL 12.5 MG PO TABS
12.5000 mg | ORAL_TABLET | Freq: Two times a day (BID) | ORAL | 1 refills | Status: DC
  Filled 2023-07-07: qty 180, 90d supply, fill #0
  Filled 2023-10-02: qty 180, 90d supply, fill #1

## 2023-07-10 ENCOUNTER — Other Ambulatory Visit (HOSPITAL_COMMUNITY): Payer: Self-pay

## 2023-07-11 ENCOUNTER — Other Ambulatory Visit (HOSPITAL_COMMUNITY): Payer: Self-pay

## 2023-07-11 MED ORDER — PRAVASTATIN SODIUM 20 MG PO TABS
20.0000 mg | ORAL_TABLET | Freq: Every evening | ORAL | 0 refills | Status: DC
  Filled 2023-07-11: qty 90, 90d supply, fill #0

## 2023-07-25 ENCOUNTER — Other Ambulatory Visit: Payer: Self-pay

## 2023-07-25 ENCOUNTER — Other Ambulatory Visit (HOSPITAL_COMMUNITY): Payer: Self-pay

## 2023-07-25 MED ORDER — FUROSEMIDE 40 MG PO TABS
40.0000 mg | ORAL_TABLET | Freq: Every day | ORAL | 0 refills | Status: DC
  Filled 2023-07-25: qty 30, 30d supply, fill #0

## 2023-07-25 MED ORDER — PANTOPRAZOLE SODIUM 40 MG PO TBEC
40.0000 mg | DELAYED_RELEASE_TABLET | Freq: Every day | ORAL | 0 refills | Status: DC
  Filled 2023-07-25: qty 90, 90d supply, fill #0

## 2023-08-16 ENCOUNTER — Other Ambulatory Visit (HOSPITAL_COMMUNITY): Payer: Self-pay

## 2023-08-17 ENCOUNTER — Other Ambulatory Visit (HOSPITAL_COMMUNITY): Payer: Self-pay

## 2023-08-17 MED ORDER — TRAMADOL HCL 50 MG PO TABS
50.0000 mg | ORAL_TABLET | Freq: Four times a day (QID) | ORAL | 0 refills | Status: DC | PRN
  Filled 2023-08-17: qty 120, 30d supply, fill #0

## 2023-08-18 ENCOUNTER — Other Ambulatory Visit: Payer: Self-pay

## 2023-08-23 ENCOUNTER — Other Ambulatory Visit (HOSPITAL_COMMUNITY): Payer: Self-pay

## 2023-08-28 DIAGNOSIS — R7303 Prediabetes: Secondary | ICD-10-CM | POA: Diagnosis not present

## 2023-08-28 DIAGNOSIS — I10 Essential (primary) hypertension: Secondary | ICD-10-CM | POA: Diagnosis not present

## 2023-08-28 DIAGNOSIS — E7800 Pure hypercholesterolemia, unspecified: Secondary | ICD-10-CM | POA: Diagnosis not present

## 2023-09-04 ENCOUNTER — Other Ambulatory Visit (HOSPITAL_COMMUNITY): Payer: Self-pay

## 2023-09-04 ENCOUNTER — Other Ambulatory Visit: Payer: Self-pay

## 2023-09-04 MED ORDER — OLMESARTAN MEDOXOMIL 20 MG PO TABS
20.0000 mg | ORAL_TABLET | Freq: Every day | ORAL | 0 refills | Status: DC
  Filled 2023-09-04: qty 30, 30d supply, fill #0

## 2023-09-04 MED ORDER — FUROSEMIDE 40 MG PO TABS
40.0000 mg | ORAL_TABLET | Freq: Every day | ORAL | 0 refills | Status: DC
  Filled 2023-09-04: qty 30, 30d supply, fill #0

## 2023-09-04 MED ORDER — OLMESARTAN MEDOXOMIL 40 MG PO TABS
40.0000 mg | ORAL_TABLET | Freq: Every day | ORAL | 1 refills | Status: DC
  Filled 2023-09-04: qty 30, 30d supply, fill #0
  Filled 2023-10-02: qty 30, 30d supply, fill #1

## 2023-09-05 ENCOUNTER — Other Ambulatory Visit: Payer: Self-pay

## 2023-09-15 ENCOUNTER — Other Ambulatory Visit: Payer: Self-pay

## 2023-09-15 ENCOUNTER — Other Ambulatory Visit (HOSPITAL_COMMUNITY): Payer: Self-pay

## 2023-09-15 DIAGNOSIS — R062 Wheezing: Secondary | ICD-10-CM | POA: Diagnosis not present

## 2023-09-15 DIAGNOSIS — E7800 Pure hypercholesterolemia, unspecified: Secondary | ICD-10-CM | POA: Diagnosis not present

## 2023-09-15 DIAGNOSIS — Z23 Encounter for immunization: Secondary | ICD-10-CM | POA: Diagnosis not present

## 2023-09-15 DIAGNOSIS — I10 Essential (primary) hypertension: Secondary | ICD-10-CM | POA: Diagnosis not present

## 2023-09-15 DIAGNOSIS — R7303 Prediabetes: Secondary | ICD-10-CM | POA: Diagnosis not present

## 2023-09-15 DIAGNOSIS — Z0000 Encounter for general adult medical examination without abnormal findings: Secondary | ICD-10-CM | POA: Diagnosis not present

## 2023-09-15 DIAGNOSIS — N6323 Unspecified lump in the left breast, lower outer quadrant: Secondary | ICD-10-CM | POA: Diagnosis not present

## 2023-09-15 MED ORDER — BREZTRI AEROSPHERE 160-9-4.8 MCG/ACT IN AERO
2.0000 | INHALATION_SPRAY | Freq: Two times a day (BID) | RESPIRATORY_TRACT | 2 refills | Status: AC
  Filled 2023-09-15: qty 10.7, 30d supply, fill #0
  Filled 2023-11-02: qty 10.7, 30d supply, fill #1
  Filled 2024-01-15: qty 10.7, 30d supply, fill #2

## 2023-09-18 ENCOUNTER — Other Ambulatory Visit: Payer: Self-pay

## 2023-09-19 ENCOUNTER — Other Ambulatory Visit: Payer: Self-pay | Admitting: Nurse Practitioner

## 2023-09-19 DIAGNOSIS — N6323 Unspecified lump in the left breast, lower outer quadrant: Secondary | ICD-10-CM

## 2023-10-02 ENCOUNTER — Other Ambulatory Visit (HOSPITAL_COMMUNITY): Payer: Self-pay

## 2023-10-03 ENCOUNTER — Other Ambulatory Visit (HOSPITAL_COMMUNITY): Payer: Self-pay

## 2023-10-04 ENCOUNTER — Other Ambulatory Visit (HOSPITAL_COMMUNITY): Payer: Self-pay

## 2023-10-05 ENCOUNTER — Other Ambulatory Visit (HOSPITAL_COMMUNITY): Payer: Self-pay

## 2023-10-06 ENCOUNTER — Other Ambulatory Visit: Payer: Self-pay

## 2023-10-06 ENCOUNTER — Other Ambulatory Visit (HOSPITAL_COMMUNITY): Payer: Self-pay

## 2023-10-06 MED ORDER — FUROSEMIDE 40 MG PO TABS
40.0000 mg | ORAL_TABLET | Freq: Every day | ORAL | 0 refills | Status: DC
  Filled 2023-10-06: qty 30, 30d supply, fill #0

## 2023-10-06 MED ORDER — TRAMADOL HCL 50 MG PO TABS
50.0000 mg | ORAL_TABLET | Freq: Four times a day (QID) | ORAL | 0 refills | Status: DC | PRN
  Filled 2023-10-06: qty 120, 30d supply, fill #0

## 2023-10-09 ENCOUNTER — Other Ambulatory Visit (HOSPITAL_COMMUNITY): Payer: Self-pay

## 2023-10-09 ENCOUNTER — Other Ambulatory Visit: Payer: Self-pay

## 2023-10-09 MED ORDER — PRAVASTATIN SODIUM 20 MG PO TABS
20.0000 mg | ORAL_TABLET | Freq: Every evening | ORAL | 0 refills | Status: DC
  Filled 2023-10-09: qty 90, 90d supply, fill #0

## 2023-10-11 ENCOUNTER — Other Ambulatory Visit (HOSPITAL_COMMUNITY): Payer: Self-pay

## 2023-10-20 ENCOUNTER — Other Ambulatory Visit (HOSPITAL_COMMUNITY): Payer: Self-pay

## 2023-10-23 ENCOUNTER — Other Ambulatory Visit (HOSPITAL_COMMUNITY): Payer: Self-pay

## 2023-10-23 ENCOUNTER — Other Ambulatory Visit: Payer: Self-pay

## 2023-10-23 MED ORDER — PANTOPRAZOLE SODIUM 40 MG PO TBEC
40.0000 mg | DELAYED_RELEASE_TABLET | Freq: Every day | ORAL | 0 refills | Status: DC
  Filled 2023-10-23: qty 90, 90d supply, fill #0

## 2023-10-26 DIAGNOSIS — N6325 Unspecified lump in the left breast, overlapping quadrants: Secondary | ICD-10-CM | POA: Diagnosis not present

## 2023-10-26 DIAGNOSIS — R928 Other abnormal and inconclusive findings on diagnostic imaging of breast: Secondary | ICD-10-CM | POA: Diagnosis not present

## 2023-10-30 ENCOUNTER — Other Ambulatory Visit (HOSPITAL_COMMUNITY): Payer: Self-pay

## 2023-10-30 DIAGNOSIS — I10 Essential (primary) hypertension: Secondary | ICD-10-CM | POA: Diagnosis not present

## 2023-10-30 DIAGNOSIS — E7800 Pure hypercholesterolemia, unspecified: Secondary | ICD-10-CM | POA: Diagnosis not present

## 2023-10-30 MED ORDER — AMLODIPINE BESYLATE 10 MG PO TABS
10.0000 mg | ORAL_TABLET | Freq: Every day | ORAL | 1 refills | Status: AC
  Filled 2023-10-30: qty 90, 90d supply, fill #0
  Filled 2024-02-01: qty 90, 90d supply, fill #1

## 2023-10-30 MED ORDER — WEGOVY 0.25 MG/0.5ML ~~LOC~~ SOAJ
0.2500 mg | SUBCUTANEOUS | 0 refills | Status: DC
  Filled 2023-10-30 – 2023-11-03 (×2): qty 2, 28d supply, fill #0

## 2023-10-30 MED ORDER — FUROSEMIDE 40 MG PO TABS
40.0000 mg | ORAL_TABLET | Freq: Every day | ORAL | 0 refills | Status: DC
  Filled 2023-10-30: qty 30, 30d supply, fill #0

## 2023-11-02 ENCOUNTER — Other Ambulatory Visit (HOSPITAL_COMMUNITY): Payer: Self-pay

## 2023-11-03 ENCOUNTER — Other Ambulatory Visit (HOSPITAL_COMMUNITY): Payer: Self-pay

## 2023-11-07 ENCOUNTER — Other Ambulatory Visit (HOSPITAL_COMMUNITY): Payer: Self-pay

## 2023-11-07 MED ORDER — DICLOFENAC SODIUM 1 % EX GEL
CUTANEOUS | 2 refills | Status: AC
Start: 2023-11-03 — End: ?
  Filled 2023-11-07: qty 500, 90d supply, fill #0

## 2023-11-09 ENCOUNTER — Other Ambulatory Visit: Payer: Self-pay

## 2023-11-09 ENCOUNTER — Other Ambulatory Visit (HOSPITAL_COMMUNITY): Payer: Self-pay

## 2023-11-09 DIAGNOSIS — C50512 Malignant neoplasm of lower-outer quadrant of left female breast: Secondary | ICD-10-CM | POA: Diagnosis not present

## 2023-11-09 DIAGNOSIS — R062 Wheezing: Secondary | ICD-10-CM | POA: Diagnosis not present

## 2023-11-09 DIAGNOSIS — D242 Benign neoplasm of left breast: Secondary | ICD-10-CM | POA: Diagnosis not present

## 2023-11-09 DIAGNOSIS — C773 Secondary and unspecified malignant neoplasm of axilla and upper limb lymph nodes: Secondary | ICD-10-CM | POA: Diagnosis not present

## 2023-11-09 DIAGNOSIS — D241 Benign neoplasm of right breast: Secondary | ICD-10-CM | POA: Diagnosis not present

## 2023-11-09 MED ORDER — OLMESARTAN MEDOXOMIL 40 MG PO TABS
40.0000 mg | ORAL_TABLET | Freq: Every day | ORAL | 1 refills | Status: DC
  Filled 2023-11-09 (×2): qty 30, 30d supply, fill #0

## 2023-11-10 ENCOUNTER — Telehealth: Payer: Self-pay | Admitting: *Deleted

## 2023-11-10 LAB — SURGICAL PATHOLOGY

## 2023-11-10 NOTE — Telephone Encounter (Signed)
 Left message to schedule patient for [facility] for 11/12.  Contact info left on identified VM

## 2023-11-13 ENCOUNTER — Telehealth: Payer: Self-pay | Admitting: *Deleted

## 2023-11-13 NOTE — Telephone Encounter (Signed)
 Spoke to patient to confirm upcoming morning [facility] clinic appointment on 11/12, paperwork will be sent via mail  Gave location and time, also informed patient that the surgeon's office would be calling as well to get information from them similar to the packet that they will be receiving so make sure to do both.  Reminded patient that all providers will be coming to the clinic to see them HERE and if they had any questions to not hesitate to reach back out to myself or their navigators.

## 2023-11-13 NOTE — Telephone Encounter (Signed)
 Left voice mail for patient in reference to upcoming [facility] appointment on 11/12 arrival time of 830, mailing packet to patient

## 2023-11-20 ENCOUNTER — Encounter: Payer: Self-pay | Admitting: *Deleted

## 2023-11-20 ENCOUNTER — Other Ambulatory Visit (HOSPITAL_COMMUNITY): Payer: Self-pay

## 2023-11-20 DIAGNOSIS — C50512 Malignant neoplasm of lower-outer quadrant of left female breast: Secondary | ICD-10-CM | POA: Insufficient documentation

## 2023-11-21 ENCOUNTER — Encounter: Payer: Self-pay | Admitting: Nurse Practitioner

## 2023-11-21 ENCOUNTER — Other Ambulatory Visit: Payer: Self-pay

## 2023-11-21 ENCOUNTER — Other Ambulatory Visit (HOSPITAL_COMMUNITY): Payer: Self-pay

## 2023-11-21 MED ORDER — TRAMADOL HCL 50 MG PO TABS
50.0000 mg | ORAL_TABLET | Freq: Four times a day (QID) | ORAL | 0 refills | Status: DC | PRN
  Filled 2023-11-21 – 2023-11-27 (×2): qty 120, 30d supply, fill #0

## 2023-11-21 NOTE — Progress Notes (Signed)
 Radiation Oncology         [phone] ________________________________  Multidisciplinary Breast Oncology Clinic [facility]) Initial Outpatient Consultation  Name: Ashley Warren MRN: [id number]  Date: 11/22/2023  DOB: 1955-03-09  RR:Gnwzd, Santana CROME, NP  Vernetta Berg, MD   REFERRING PHYSICIAN: Vernetta Berg, MD  DIAGNOSIS: The encounter diagnosis was Malignant neoplasm of lower-outer quadrant of left breast of female, estrogen receptor negative (HCC).  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 Malignant neoplasm of lower-outer quadrant of left breast of female, estrogen receptor negative (HCC)  C50.512    Z17.1       HISTORY OF PRESENT ILLNESS::Ashley Warren is a 68 y.o. female who is presenting to the office today for evaluation of her newly diagnosed left breast cancer. She is accompanied by her supportive sister. She is doing well overall.   The patient presented to her PCP in early September (2025) for a routine check-up and was found to have a palpable lump in the left breast. She accordingly underwent a bilateral diagnostic mammogram at [facility] on 10/26/23 which revealed: an indeterminate 0.9 cm oval mass in the upper inner right breast; an indeterminate 2.3 cm oval mass in the 4 o'clock left breast at a posterior depth correlating with the palpable site of concern; and a 1.1 cm mass in the 4 o'clock left breast located behind the palpable mass, and also located at a posterior depth.   A bilateral breast ultrasound was then performed (that same day) which demonstrated: a 0.6 cm oval mass in the 1 o'clock right breast concerning for malignancy (correlating with the mammographic finding in the right breast); a 2.6 cm taller than wide irregular mass in the 4 o'clock left breast at a posterior depth concerning for malignancy and an additional 1.5 cm mass also located in the  4 o'clock left breast at a posterior depth concerning for malignancy (correlating with the two 4 o'clock left breast masses demonstrated mammographically); and four enlarged left axillary lymph nodes, all with features highly suggestive of malignancy.   Biopsies collected on 11/09/23 are detailed as follows:   -- Biopsy of the palpable 4 o'clock left breast mass located 17 cmfn showed: grade 3 invasive ductal carcinoma measuring 1 cm in the greatest linear extent of the sample; negative for LVI. Prognostic indicators significant for: estrogen receptor, 0% negative and progesterone receptor, 0% negative. Proliferation marker Ki67 at 10%. HER2 positive. -- Biopsy of 1 (out of the 4) abnormal left axillary lymph node showed metastatic carcinoma measuring 0.9 cm in the greatest extent. -- Biopsy of the other 4 o'clock left breast mass located 19 cmfn showed no evidence of malignancy (fibroadenoma).  -- Biopsy of the 1 o'clock right breast mass (located 10 cmfn) showed no evidence of malignancy (fibroadenoma).   The patient was referred today for presentation in the multidisciplinary conference.  Radiology studies and pathology slides were presented there for review and discussion of treatment options.  A consensus was discussed regarding potential next steps.  PREVIOUS RADIATION THERAPY: No  PAST MEDICAL HISTORY:  Past Medical History:  Diagnosis Date   Breast cancer (HCC)    Hypertension     PAST SURGICAL HISTORY: Past Surgical History:  Procedure Laterality Date   ABDOMINAL HYSTERECTOMY     APPENDECTOMY     COSMETIC SURGERY     mass removed from shoulder/scapula area    FAMILY  HISTORY:  Family History  Problem Relation Age of Onset   Alcohol  abuse Maternal Grandmother    Autism spectrum disorder Sister    Breast cancer Mother    Depression Sister    Hypertension Mother    Hypertension Sister    Osteoarthritis Maternal Grandmother     SOCIAL HISTORY:  Social History    Socioeconomic History   Marital status: Divorced    Spouse name: Not on file   Number of children: Not on file   Years of education: Not on file   Highest education level: Not on file  Occupational History   Not on file  Tobacco Use   Smoking status: Never   Smokeless tobacco: Never  Vaping Use   Vaping status: Never Used  Substance and Sexual Activity   Alcohol  use: Yes    Comment: ocially   Drug use: No   Sexual activity: Not on file  Other Topics Concern   Not on file  Social History Narrative   Not on file   Social Drivers of Health   Financial Resource Strain: Not on file  Food Insecurity: No Food Insecurity (11/23/2023)   Hunger Vital Sign    Worried About Running Out of Food in the Last Year: Never true    Ran Out of Food in the Last Year: Never true  Transportation Needs: No Transportation Needs (11/23/2023)   PRAPARE - Administrator, Civil Service (Medical): No    Lack of Transportation (Non-Medical): No  Physical Activity: Inactive (07/27/2023)   Received from [facility]   Exercise Vital Sign    On average, how many days per week do you engage in moderate to strenuous exercise (like a brisk walk)?: 0 days    On average, how many minutes do you engage in exercise at this level?: 0 min  Stress: Not on file  Social Connections: Unknown (06/01/2023)   Social Connection and Isolation Panel    Frequency of Communication with Friends and Family: More than three times a week    Frequency of Social Gatherings with Friends and Family: Not on file    Attends Religious Services: Not on file    Active Member of Clubs or Organizations: Not on file    Attends Banker Meetings: Not on file    Marital Status: Not on file    ALLERGIES:  Allergies  Allergen Reactions   Penicillins Itching    MEDICATIONS:  Current Outpatient Medications  Medication Sig Dispense Refill   albuterol  (PROVENTIL ) (2.5 MG/3ML) 0.083% nebulizer  solution Inhale 3 mL (2.5 mg total) into the lungs via nebulization every 8 (eight) hours as needed for wheezing or shortness of breath. (Patient taking differently: Take 2.5 mg by nebulization every 8 (eight) hours as needed for wheezing or shortness of breath.) 180 mL 0   albuterol  (VENTOLIN  HFA) 108 (90 Base) MCG/ACT inhaler Inhale 2 puffs every 6 (six) hours as needed for wheezing (cough). (Patient taking differently: Inhale 2 puffs into the lungs in the morning and at bedtime.) 6.7 g 1   albuterol  (VENTOLIN  HFA) 108 (90 Base) MCG/ACT inhaler Inhale 2 puffs into the lungs every 6 (six) hours as needed. 6.7 g 1   albuterol  (VENTOLIN  HFA) 108 (90 Base) MCG/ACT inhaler Inhale 2 puffs into the lungs every 6 (six) hours as needed for wheezing (cough). 6.7 g 1   amLODipine  (NORVASC ) 10 MG tablet Take 1 tablet (10 mg total) by mouth daily. 90 tablet 1  Ascorbic Acid  (VITAMIN C CR) 500 MG CPCR Take 500 mg by mouth daily.     B Complex Vitamins (B COMPLEX PO) Take 1 tablet by mouth daily.     BACILLUS COAGULANS-INULIN PO Take 1 tablet by mouth daily.     Black Cohosh 160 MG CAPS Take 160 mg by mouth daily.     Boswellia Serrata (BOSWELLIA PO) Take 375 mg by mouth daily.     budesonide -formoterol  (SYMBICORT ) 160-4.5 MCG/ACT inhaler Inhale 2 puffs into the lungs in the morning and at bedtime. 40.8 g 1   budesonide -glycopyrrolate -formoterol  (BREZTRI  AEROSPHERE) 160-9-4.8 MCG/ACT AERO inhaler Inhale 2 puffs into the lungs 2 (two) times daily. 10.7 g 2   carvedilol  (COREG ) 12.5 MG tablet Take 1 tablet (12.5 mg total) by mouth 2 (two) times daily in the morning and evening with a meal. 180 tablet 1   diclofenac  Sodium (VOLTAREN ) 1 % GEL APPLY 4 GRAMS 3 TIMES DAILY TO AFFECTED AREA(S) (Patient taking differently: Apply 1 Application topically in the morning and at bedtime.) 500 g 2   diclofenac  Sodium (VOLTAREN ) 1 % GEL Apply 1 g topically 3 (three) times a day. 500 g 2   ergocalciferol (VITAMIN D2) 1.25 MG  (50000 UT) capsule Take 50,000 Units by mouth once a week.     fluticasone  (FLONASE ) 50 MCG/ACT nasal spray Administer 2 sprays in each nostril daily. 48 g 0   furosemide  (LASIX ) 40 MG tablet Take 1 tablet (40 mg total) by mouth daily. 30 tablet 0   Milk Thistle 150 MG CAPS Take 150 mg by mouth daily.     olmesartan  (BENICAR ) 40 MG tablet Take 1 tablet (40 mg total) by mouth daily. Stop lisinopril . 30 tablet 1   OVER THE COUNTER MEDICATION Take 2 capsules by mouth daily. FISH OIL OMEGA 3 VIT C VIT E 2000-650-12MG      pantoprazole  (PROTONIX ) 40 MG tablet Take 1 tablet (40 mg total) by mouth daily. 90 tablet 0   potassium chloride  SA (KLOR-CON  M) 20 MEQ tablet Take 1 tablet (20 mEq total) by mouth daily. 30 tablet 1   potassium chloride  SA (KLOR-CON  M) 20 MEQ tablet Take 2 tablets (40 mEq total) by mouth daily. 180 tablet 1   pravastatin  (PRAVACHOL ) 20 MG tablet TAKE 1 TABLET BY MOUTH EVERY EVENING (Patient taking differently: Take 20 mg by mouth every evening.) 90 tablet 2   pravastatin  (PRAVACHOL ) 20 MG tablet Take 1 tablet (20 mg total) by mouth nightly. 90 tablet 0   prednisoLONE  acetate (PRED FORTE ) 1 % ophthalmic suspension Place 1 drop into the right eye 4 (four) times daily. (Patient not taking: Reported on 05/31/2023) 10 mL 0   semaglutide -weight management (WEGOVY ) 0.25 MG/0.5ML SOAJ SQ injection Inject 0.25 mg into the skin every 7 (seven) days. 2 mL 0   Semaglutide -Weight Management (WEGOVY ) 0.25 MG/0.5ML SOAJ Inject 0.25 mg into the skin every 7 (seven) days. 2 mL 0   Semaglutide -Weight Management (WEGOVY ) 0.25 MG/0.5ML SOAJ Inject 0.25 mg into the skin every 7 days 2 mL 0   Spacer/Aero-Holding Chambers DEVI Use as directed 1 each 1   traMADol  (ULTRAM ) 50 MG tablet Take 1 tablet (50 mg total) by mouth every 6 (six) hours as needed for moderate pain (4-6). 120 tablet 0   traZODone  (DESYREL ) 50 MG tablet Take 1 tablet (50 mg total) by mouth at bedtime. *stop zolpidem * (Patient taking  differently: Take 50 mg by mouth at bedtime as needed for sleep.) 30 tablet 3   VEVYE  0.1 % SOLN Place 1 drop into both eyes 2 (two) times daily.     zolpidem  (AMBIEN  CR) 6.25 MG CR tablet Take 1 tablet (6.25 mg total) by mouth at bedtime as needed for sleep 30 tablet 0   No current facility-administered medications for this encounter.    REVIEW OF SYSTEMS: A 10+ POINT REVIEW OF SYSTEMS WAS OBTAINED including neurology, dermatology, psychiatry, cardiac, respiratory, lymph, extremities, GI, GU, musculoskeletal, constitutional, reproductive, HEENT.  She denies any pain within the breast area nipple discharge or bleeding prior to diagnosis   PHYSICAL EXAM: Lungs are clear to auscultation bilaterally. Heart has regular rate and rhythm. No palpable cervical, supraclavicular, or axillary adenopathy. Abdomen soft, non-tender, normal bowel sounds. Breast: Right breast with no palpable mass, nipple discharge, or bleeding. Left breast with palpable mass measuring approximately 2.5 cm located in the lower outer aspect of the breast.  No palpable axillary adenopathy.  KPS = 90  100 - Normal; no complaints; no evidence of disease. 90   - Able to carry on normal activity; minor signs or symptoms of disease. 80   - Normal activity with effort; some signs or symptoms of disease. 31   - Cares for self; unable to carry on normal activity or to do active work. 60   - Requires occasional assistance, but is able to care for most of his personal needs. 50   - Requires considerable assistance and frequent medical care. 40   - Disabled; requires special care and assistance. 30   - Severely disabled; hospital admission is indicated although death not imminent. 20   - Very sick; hospital admission necessary; active supportive treatment necessary. 10   - Moribund; fatal processes progressing rapidly. 0     - Dead  Karnofsky DA, Abelmann WH, Craver LS and Burchenal [facility] [phone]) The use of the nitrogen mustards in the  palliative treatment of carcinoma: with particular reference to bronchogenic carcinoma Cancer 1 634-56  LABORATORY DATA:  Lab Results  Component Value Date   WBC 6.5 11/22/2023   HGB 15.3 (H) 11/22/2023   HCT 47.1 (H) 11/22/2023   MCV 77.1 (L) 11/22/2023   PLT 247 11/22/2023   Lab Results  Component Value Date   NA 139 11/22/2023   K 3.7 11/22/2023   CL 103 11/22/2023   CO2 32 11/22/2023   Lab Results  Component Value Date   ALT 18 11/22/2023   AST 18 11/22/2023   ALKPHOS 125 11/22/2023   BILITOT 0.5 11/22/2023    PULMONARY FUNCTION TEST:   Review Flowsheet        No data to display          RADIOGRAPHY: No results found.    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 We discussed the general course of radiation, potential side effects, and toxicities with radiation and the patient is interested in this approach. We will see the patient approximately 4 weeks after her surgery for treatment planning. Dr. Shannon anticipates a 6.5-week course of radiation treatment directed at the left breast and surrounding nodes.   We look forward to participating in this patient's care.    PLAN:  Neoadjuvant chemotherapy Lumpectomy + TAD Adjuvant radiation   ------------------------------------------------   Leeroy Due, PA-C   Deanthony Maull  CHARM Nasuti, PhD, MD   [facility]  Radiation Oncology Direct Dial: [phone]  Fax: [phone] [email or web address]    This document serves as a record of services personally performed by Lynwood Nasuti, MD. It was created on his behalf by Dorthy Fuse, a trained medical scribe. The creation of this record is based on the scribe's personal observations and the provider's statements to them. This  document has been checked and approved by the attending provider.

## 2023-11-22 ENCOUNTER — Inpatient Hospital Stay: Admitting: Hematology and Oncology

## 2023-11-22 ENCOUNTER — Other Ambulatory Visit: Payer: Self-pay | Admitting: *Deleted

## 2023-11-22 ENCOUNTER — Encounter: Payer: Self-pay | Admitting: Physical Therapy

## 2023-11-22 ENCOUNTER — Inpatient Hospital Stay

## 2023-11-22 ENCOUNTER — Other Ambulatory Visit: Payer: Self-pay

## 2023-11-22 ENCOUNTER — Encounter: Payer: Self-pay | Admitting: *Deleted

## 2023-11-22 ENCOUNTER — Ambulatory Visit: Attending: Surgery | Admitting: Physical Therapy

## 2023-11-22 ENCOUNTER — Inpatient Hospital Stay: Admitting: Licensed Clinical Social Worker

## 2023-11-22 ENCOUNTER — Inpatient Hospital Stay: Attending: Licensed Clinical Social Worker

## 2023-11-22 ENCOUNTER — Ambulatory Visit
Admission: RE | Admit: 2023-11-22 | Discharge: 2023-11-22 | Disposition: A | Discharge status: Home / Self Care | Source: Ambulatory Visit | Attending: Radiation Oncology | Admitting: Radiation Oncology

## 2023-11-22 VITALS — BP 171/103 | HR 90 | Temp 97.7°F | Resp 18 | Wt 348.9 lb

## 2023-11-22 DIAGNOSIS — Z803 Family history of malignant neoplasm of breast: Secondary | ICD-10-CM

## 2023-11-22 DIAGNOSIS — Z171 Estrogen receptor negative status [ER-]: Secondary | ICD-10-CM | POA: Insufficient documentation

## 2023-11-22 DIAGNOSIS — C50512 Malignant neoplasm of lower-outer quadrant of left female breast: Secondary | ICD-10-CM

## 2023-11-22 DIAGNOSIS — I110 Hypertensive heart disease with heart failure: Secondary | ICD-10-CM | POA: Diagnosis not present

## 2023-11-22 DIAGNOSIS — I5030 Unspecified diastolic (congestive) heart failure: Secondary | ICD-10-CM | POA: Diagnosis not present

## 2023-11-22 DIAGNOSIS — Z8042 Family history of malignant neoplasm of prostate: Secondary | ICD-10-CM

## 2023-11-22 DIAGNOSIS — C50812 Malignant neoplasm of overlapping sites of left female breast: Secondary | ICD-10-CM | POA: Diagnosis not present

## 2023-11-22 DIAGNOSIS — R293 Abnormal posture: Secondary | ICD-10-CM | POA: Diagnosis not present

## 2023-11-22 DIAGNOSIS — M1990 Unspecified osteoarthritis, unspecified site: Secondary | ICD-10-CM | POA: Diagnosis not present

## 2023-11-22 DIAGNOSIS — I119 Hypertensive heart disease without heart failure: Secondary | ICD-10-CM | POA: Diagnosis not present

## 2023-11-22 DIAGNOSIS — E669 Obesity, unspecified: Secondary | ICD-10-CM | POA: Diagnosis not present

## 2023-11-22 DIAGNOSIS — Z800 Family history of malignant neoplasm of digestive organs: Secondary | ICD-10-CM

## 2023-11-22 LAB — CBC WITH DIFFERENTIAL (CANCER CENTER ONLY)
Abs Immature Granulocytes: 0.01 K/uL (ref 0.00–0.07)
Basophils Absolute: 0.1 K/uL (ref 0.0–0.1)
Basophils Relative: 1 %
Eosinophils Absolute: 0.3 K/uL (ref 0.0–0.5)
Eosinophils Relative: 5 %
HCT: 47.1 % — ABNORMAL HIGH (ref 36.0–46.0)
Hemoglobin: 15.3 g/dL — ABNORMAL HIGH (ref 12.0–15.0)
Immature Granulocytes: 0 %
Lymphocytes Relative: 35 %
Lymphs Abs: 2.3 K/uL (ref 0.7–4.0)
MCH: 25 pg — ABNORMAL LOW (ref 26.0–34.0)
MCHC: 32.5 g/dL (ref 30.0–36.0)
MCV: 77.1 fL — ABNORMAL LOW (ref 80.0–100.0)
Monocytes Absolute: 0.6 K/uL (ref 0.1–1.0)
Monocytes Relative: 10 %
Neutro Abs: 3.2 K/uL (ref 1.7–7.7)
Neutrophils Relative %: 49 %
Platelet Count: 247 K/uL (ref 150–400)
RBC: 6.11 MIL/uL — ABNORMAL HIGH (ref 3.87–5.11)
RDW: 19.3 % — ABNORMAL HIGH (ref 11.5–15.5)
WBC Count: 6.5 K/uL (ref 4.0–10.5)
nRBC: 0 % (ref 0.0–0.2)

## 2023-11-22 LAB — CMP (CANCER CENTER ONLY)
ALT: 18 U/L (ref 10–47)
AST: 18 U/L (ref 11–38)
Albumin: 3.6 g/dL (ref 3.5–5.0)
Alkaline Phosphatase: 125 U/L (ref 38–126)
Anion gap: 4 — ABNORMAL LOW (ref 5–15)
BUN: 11 mg/dL (ref 8–23)
CO2: 32 mmol/L (ref 22–32)
Calcium: 9.6 mg/dL (ref 8.9–10.3)
Chloride: 103 mmol/L (ref 98–111)
Creatinine: 0.6 mg/dL (ref 0.60–1.20)
Glucose, Bld: 112 mg/dL — ABNORMAL HIGH (ref 70–99)
Potassium: 3.7 mmol/L (ref 3.5–5.1)
Sodium: 139 mmol/L (ref 135–145)
Total Bilirubin: 0.5 mg/dL (ref 0.2–1.6)
Total Protein: 7.7 g/dL (ref 6.5–8.1)

## 2023-11-22 NOTE — Progress Notes (Signed)
 Error

## 2023-11-22 NOTE — Progress Notes (Signed)
START ON PATHWAY REGIMEN - Breast     Cycle 1: A cycle is 21 days:     Pertuzumab      Trastuzumab-xxxx      Docetaxel      Carboplatin    Cycles 2 through 6: A cycle is every 21 days:     Pertuzumab      Trastuzumab-xxxx      Docetaxel      Carboplatin   **Always confirm dose/schedule in your pharmacy ordering system**  Patient Characteristics: Preoperative or Nonsurgical Candidate, M0 (Clinical Staging), Up to cT4c, Any N, M0, Neoadjuvant Therapy followed by Surgery, Invasive Disease, Chemotherapy, HER2 Positive, ER Negative Therapeutic Status: Preoperative or Nonsurgical Candidate, M0 (Clinical Staging) AJCC M Category: cM0 AJCC Grade: G3 ER Status: Negative (-) AJCC 8 Stage Grouping: IIB HER2 Status: Positive (+) AJCC T Category: cT2 AJCC N Category: cN1 PR Status: Negative (-) Breast Surgical Plan: Neoadjuvant Therapy followed by Surgery Intent of Therapy: Curative Intent, Discussed with Patient

## 2023-11-22 NOTE — Progress Notes (Signed)
 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 - Initiated neoadjuvant chemotherapy with TCH regimen at 75% dose. - Monitor blood counts, liver, kidney, and cardiac function during chemotherapy. - Administer anti-nausea medications as needed. - Consider Herceptin post-surgery vs adj kadcyla. - Educated on chemotherapy side effects: fatigue, nausea, diarrhea, hair loss, cardiotoxicity, neuropathy, immunocompromised status. - She was not interested in coldcap.  Congestive heart failure with diastolic dysfunction Managed with diuretics and antihypertensives. Cardiac function adequate for chemotherapy. - Continue current heart failure medications. - Monitor cardiac function during chemotherapy.  Hypertension Managed with antihypertensive medications. Blood pressure control essential for chemotherapy tolerance. - Continue current antihypertensive medications.   HISTORY OF PRESENTING ILLNESS:  Ashley Warren 68 y.o. female is here because of new diagnosis of breast cancer.  Discussed the use of AI scribe software for clinical note transcription with the patient, who gave verbal consent to proceed.  History of Present Illness  Ashley Warren is a 68 year old female with breast cancer who presents for  oncology consultation.  She has a mass in her left breast at the four o'clock position, measuring 2.3 cm. The mass is painless and was initially discovered by her doctor. There is another area posterior to the mass that is not well defined. A mammogram and ultrasound showed a benign finding in the right breast. The left breast mass was biopsied and diagnosed as invasive ductal carcinoma, which is estrogen receptor negative, progesterone receptor negative, and HER2 positive. The cancer has spread to at least one lymph node, with suspicion of more lymph nodes being involved, although only one was biopsied.  She has a history of high blood pressure and congestive heart failure, managed with a diuretic and blood pressure medications.   She experiences arthritis, particularly affecting her back and causing sciatic pain. She also reports shortness of breath and congestion, which she attributes to having had COVID-19 three times and RSV earlier this year. Pulmonary function tests have been conducted, but results are pending. No current oxygen  dependency.  Her family history includes her mother, who had breast cancer over 20 years ago and was treated with radiation. She is concerned about her own condition, referencing colleagues who have had breast cancer with varying outcomes.  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  All other systems were reviewed with the patient and are negative.  MEDICAL HISTORY:  Past Medical History:  Diagnosis Date   Hypertension     SURGICAL HISTORY: Past Surgical History:  Procedure Laterality Date   ABDOMINAL HYSTERECTOMY     APPENDECTOMY     COSMETIC SURGERY     mass removed from shoulder/scapula area    SOCIAL HISTORY: Social History   Socioeconomic History   Marital status: Divorced    Spouse name: Not on file   Number of children: Not on file   Years of education: Not on file   Highest education level: Not  on file  Occupational History    Not on file  Tobacco Use   Smoking status: Never   Smokeless tobacco: Never  Vaping Use   Vaping status: Never Used  Substance and Sexual Activity   Alcohol  use: Yes    Comment: ocially   Drug use: No   Sexual activity: Not on file  Other Topics Concern   Not on file  Social History Narrative   Not on file   Social Drivers of Health   Financial Resource Strain: Not on file  Food Insecurity: No Food Insecurity (05/31/2023)   Hunger Vital Sign    Worried About Running Out of Food in the Last Year: Never true    Ran Out of Food in the Last Year: Never true  Transportation Needs: No Transportation Needs (06/01/2023)   PRAPARE - Administrator, Civil Service (Medical): No    Lack of Transportation (Non-Medical): No  Physical Activity: Inactive (07/27/2023)   Received from [facility]   Exercise Vital Sign    On average, how many days per week do you engage in moderate to strenuous exercise (like a brisk walk)?: 0 days    On average, how many minutes do you engage in exercise at this level?: 0 min  Stress: Not on file  Social Connections: Unknown (06/01/2023)   Social Connection and Isolation Panel    Frequency of Communication with Friends and Family: More than three times a week    Frequency of Social Gatherings with Friends and Family: Not on file    Attends Religious Services: Not on file    Active Member of Clubs or Organizations: Not on file    Attends Banker Meetings: Not on file    Marital Status: Not on file  Intimate Partner Violence: Not At Risk (06/01/2023)   Humiliation, Afraid, Rape, and Kick questionnaire    Fear of Current or Ex-Partner: No    Emotionally Abused: No    Physically Abused: No    Sexually Abused: No    FAMILY HISTORY: Family History  Problem Relation Age of Onset   Alcohol  abuse Maternal Grandmother    Autism spectrum disorder Sister    Breast cancer Mother    Depression Sister    Hypertension Mother     Hypertension Sister    Osteoarthritis Maternal Grandmother     ALLERGIES:  is allergic to penicillins.  MEDICATIONS:  Current Outpatient Medications  Medication Sig Dispense Refill   albuterol  (PROVENTIL ) (2.5 MG/3ML) 0.083% nebulizer solution Inhale 3 mL (2.5 mg total) into the lungs via nebulization every 8 (eight) hours as needed for wheezing or shortness of breath. (Patient taking differently: Take 2.5 mg by nebulization every 8 (eight) hours as needed for wheezing or shortness of breath.) 180 mL 0   albuterol  (VENTOLIN  HFA) 108 (90 Base) MCG/ACT inhaler Inhale 2 puffs every 6 (six) hours as needed for wheezing (cough). (Patient taking differently: Inhale 2 puffs into the lungs in the morning and at bedtime.) 6.7 g 1   albuterol  (VENTOLIN  HFA) 108 (90 Base) MCG/ACT inhaler Inhale 2 puffs into the lungs every 6 (six) hours as needed. 6.7 g 1   albuterol  (VENTOLIN  HFA) 108 (90 Base) MCG/ACT inhaler Inhale 2 puffs into the lungs every 6 (six) hours as needed for wheezing (cough). 6.7 g 1   amLODipine  (NORVASC ) 10 MG tablet Take 1 tablet (10 mg total) by mouth daily. 90 tablet 1   Ascorbic Acid  (VITAMIN C CR) 500  MG CPCR Take 500 mg by mouth daily.     B Complex Vitamins (B COMPLEX PO) Take 1 tablet by mouth daily.     BACILLUS COAGULANS-INULIN PO Take 1 tablet by mouth daily.     Black Cohosh 160 MG CAPS Take 160 mg by mouth daily.     Boswellia Serrata (BOSWELLIA PO) Take 375 mg by mouth daily.     budesonide -formoterol  (SYMBICORT ) 160-4.5 MCG/ACT inhaler Inhale 2 puffs into the lungs in the morning and at bedtime. 40.8 g 1   budesonide -glycopyrrolate -formoterol  (BREZTRI  AEROSPHERE) 160-9-4.8 MCG/ACT AERO inhaler Inhale 2 puffs into the lungs 2 (two) times daily. 10.7 g 2   carvedilol  (COREG ) 12.5 MG tablet Take 1 tablet (12.5 mg total) by mouth 2 (two) times daily in the morning and evening with a meal. 180 tablet 1   diclofenac  Sodium (VOLTAREN ) 1 % GEL APPLY 4 GRAMS 3 TIMES DAILY TO  AFFECTED AREA(S) (Patient taking differently: Apply 1 Application topically in the morning and at bedtime.) 500 g 2   diclofenac  Sodium (VOLTAREN ) 1 % GEL Apply 1 g topically 3 (three) times a day. 500 g 2   ergocalciferol (VITAMIN D2) 1.25 MG (50000 UT) capsule Take 50,000 Units by mouth once a week.     furosemide  (LASIX ) 40 MG tablet Take 1 tablet (40 mg total) by mouth daily. 30 tablet 0   Milk Thistle 150 MG CAPS Take 150 mg by mouth daily.     olmesartan  (BENICAR ) 40 MG tablet Take 1 tablet (40 mg total) by mouth daily. Stop lisinopril . 30 tablet 1   OVER THE COUNTER MEDICATION Take 2 capsules by mouth daily. FISH OIL OMEGA 3 VIT C VIT E 2000-650-12MG      pantoprazole  (PROTONIX ) 40 MG tablet Take 1 tablet (40 mg total) by mouth daily. 90 tablet 0   potassium chloride  SA (KLOR-CON  M) 20 MEQ tablet Take 1 tablet (20 mEq total) by mouth daily. 30 tablet 1   potassium chloride  SA (KLOR-CON  M) 20 MEQ tablet Take 2 tablets (40 mEq total) by mouth daily. 180 tablet 1   pravastatin  (PRAVACHOL ) 20 MG tablet TAKE 1 TABLET BY MOUTH EVERY EVENING (Patient taking differently: Take 20 mg by mouth every evening.) 90 tablet 2   pravastatin  (PRAVACHOL ) 20 MG tablet Take 1 tablet (20 mg total) by mouth nightly. 90 tablet 0   prednisoLONE  acetate (PRED FORTE ) 1 % ophthalmic suspension Place 1 drop into the right eye 4 (four) times daily. (Patient not taking: Reported on 05/31/2023) 10 mL 0   semaglutide -weight management (WEGOVY ) 0.25 MG/0.5ML SOAJ SQ injection Inject 0.25 mg into the skin every 7 (seven) days. 2 mL 0   Semaglutide -Weight Management (WEGOVY ) 0.25 MG/0.5ML SOAJ Inject 0.25 mg into the skin every 7 (seven) days. 2 mL 0   Semaglutide -Weight Management (WEGOVY ) 0.25 MG/0.5ML SOAJ Inject 0.25 mg into the skin every 7 days 2 mL 0   Spacer/Aero-Holding Chambers DEVI Use as directed 1 each 1   traMADol  (ULTRAM ) 50 MG tablet Take 1 tablet (50 mg total) by mouth every 6 (six) hours as needed for moderate  pain (4-6). 120 tablet 0   traZODone  (DESYREL ) 50 MG tablet Take 1 tablet (50 mg total) by mouth at bedtime. *stop zolpidem * (Patient taking differently: Take 50 mg by mouth at bedtime as needed for sleep.) 30 tablet 3   VEVYE 0.1 % SOLN Place 1 drop into both eyes 2 (two) times daily.     zolpidem  (AMBIEN  CR) 6.25 MG CR tablet  Take 1 tablet (6.25 mg total) by mouth at bedtime as needed for sleep 30 tablet 0   No current facility-administered medications for this visit.     PHYSICAL EXAMINATION: ECOG PERFORMANCE STATUS: 0 - Asymptomatic  There were no vitals filed for this visit. There were no vitals filed for this visit.  GENERAL:alert, no distress and comfortable Palpable left breast mass at 4 o clock position about 15 cms from nipple measuring about 2.5 cms Left axillary adenopathy not easily felt   LABORATORY DATA:  I have reviewed the data as listed Lab Results  Component Value Date   WBC 7.4 06/02/2023   HGB 15.4 (H) 06/02/2023   HCT 46.9 (H) 06/02/2023   MCV 76.9 (L) 06/02/2023   PLT 320 06/02/2023     Chemistry      Component Value Date/Time   NA 136 06/02/2023 0245   K 3.3 (L) 06/02/2023 0245   CL 101 06/02/2023 0245   CO2 27 06/02/2023 0245   BUN 15 06/02/2023 0245   CREATININE 0.67 06/02/2023 0245      Component Value Date/Time   CALCIUM  10.1 06/02/2023 0245   ALKPHOS 79 05/21/2020 0457   AST 20 05/21/2020 0457   ALT 25 05/21/2020 0457   BILITOT 0.3 05/21/2020 0457       RADIOGRAPHIC STUDIES: I have personally reviewed the radiological images as listed and agreed with the findings in the report. No results found.  All questions were answered. The patient knows to call the clinic with any problems, questions or concerns. I spent 60 minutes in the care of this patient including H and P, review of records, counseling and coordination of care.     Amber Stalls, MD 11/22/2023 12:28 PM

## 2023-11-22 NOTE — Progress Notes (Deleted)
 [facility] CONSULT NOTE  Patient Care Team: Joshua Santana CROME, NP as PCP - General (Nurse Practitioner) Tyree Nanetta SAILOR, RN as Oncology Nurse Navigator Vernetta Berg, MD as Consulting Physician (General Surgery) Loretha Ash, MD as Consulting Physician (Hematology and Oncology) Shannon Agent, MD as Consulting Physician (Radiation Oncology)  CHIEF COMPLAINTS/PURPOSE OF CONSULTATION:  New diagnosis of breast cancer  ASSESSMENT & PLAN:  No problem-specific Assessment & Plan notes found for this encounter.  No orders of the defined types were placed in this encounter.    HISTORY OF PRESENTING ILLNESS:  Ashley Warren 69 y.o. female is here because of left breast cancer  REVIEW OF SYSTEMS:   Constitutional: Denies fevers, chills or abnormal night sweats Eyes: Denies blurriness of vision, double vision or watery eyes Ears, nose, mouth, throat, and face: Denies mucositis or sore throat Respiratory: Denies cough, dyspnea or wheezes Cardiovascular: Denies palpitation, chest discomfort or lower extremity swelling Gastrointestinal:  Denies nausea, heartburn or change in bowel habits Skin: Denies abnormal skin rashes Lymphatics: Denies new lymphadenopathy or easy bruising Neurological:Denies numbness, tingling or new weaknesses Behavioral/Psych: Mood is stable, no new changes  All other systems were reviewed with the patient and are negative.  MEDICAL HISTORY:  Past Medical History:  Diagnosis Date   Hypertension     SURGICAL HISTORY: Past Surgical History:  Procedure Laterality Date   ABDOMINAL HYSTERECTOMY     APPENDECTOMY     COSMETIC SURGERY     mass removed from shoulder/scapula area    SOCIAL HISTORY: Social History   Socioeconomic History   Marital status: Divorced    Spouse name: Not on file   Number of children: Not on file   Years of education: Not on file   Highest education level: Not on file  Occupational History   Not on file   Tobacco Use   Smoking status: Never   Smokeless tobacco: Never  Vaping Use   Vaping status: Never Used  Substance and Sexual Activity   Alcohol  use: Yes    Comment: ocially   Drug use: No   Sexual activity: Not on file  Other Topics Concern   Not on file  Social History Narrative   Not on file   Social Drivers of Health   Financial Resource Strain: Not on file  Food Insecurity: No Food Insecurity (05/31/2023)   Hunger Vital Sign    Worried About Running Out of Food in the Last Year: Never true    Ran Out of Food in the Last Year: Never true  Transportation Needs: No Transportation Needs (06/01/2023)   PRAPARE - Administrator, Civil Service (Medical): No    Lack of Transportation (Non-Medical): No  Physical Activity: Inactive (07/27/2023)   Received from [facility]   Exercise Vital Sign    On average, how many days per week do you engage in moderate to strenuous exercise (like a brisk walk)?: 0 days    On average, how many minutes do you engage in exercise at this level?: 0 min  Stress: Not on file  Social Connections: Unknown (06/01/2023)   Social Connection and Isolation Panel    Frequency of Communication with Friends and Family: More than three times a week    Frequency of Social Gatherings with Friends and Family: Not on file    Attends Religious Services: Not on file    Active Member of Clubs or Organizations: Not on file    Attends Banker  Meetings: Not on file    Marital Status: Not on file  Intimate Partner Violence: Not At Risk (06/01/2023)   Humiliation, Afraid, Rape, and Kick questionnaire    Fear of Current or Ex-Partner: No    Emotionally Abused: No    Physically Abused: No    Sexually Abused: No    FAMILY HISTORY: Family History  Problem Relation Age of Onset   Alcohol  abuse Maternal Grandmother    Autism spectrum disorder Sister    Breast cancer Mother    Depression Sister    Hypertension Mother     Hypertension Sister    Osteoarthritis Maternal Grandmother     ALLERGIES:  is allergic to penicillins.  MEDICATIONS:  Current Outpatient Medications  Medication Sig Dispense Refill   albuterol  (PROVENTIL ) (2.5 MG/3ML) 0.083% nebulizer solution Inhale 3 mL (2.5 mg total) into the lungs via nebulization every 8 (eight) hours as needed for wheezing or shortness of breath. (Patient taking differently: Take 2.5 mg by nebulization every 8 (eight) hours as needed for wheezing or shortness of breath.) 180 mL 0   albuterol  (VENTOLIN  HFA) 108 (90 Base) MCG/ACT inhaler Inhale 2 puffs every 6 (six) hours as needed for wheezing (cough). (Patient taking differently: Inhale 2 puffs into the lungs in the morning and at bedtime.) 6.7 g 1   albuterol  (VENTOLIN  HFA) 108 (90 Base) MCG/ACT inhaler Inhale 2 puffs into the lungs every 6 (six) hours as needed. 6.7 g 1   albuterol  (VENTOLIN  HFA) 108 (90 Base) MCG/ACT inhaler Inhale 2 puffs into the lungs every 6 (six) hours as needed for wheezing (cough). 6.7 g 1   amLODipine  (NORVASC ) 10 MG tablet Take 1 tablet (10 mg total) by mouth daily. 90 tablet 1   Ascorbic Acid  (VITAMIN C CR) 500 MG CPCR Take 500 mg by mouth daily.     B Complex Vitamins (B COMPLEX PO) Take 1 tablet by mouth daily.     BACILLUS COAGULANS-INULIN PO Take 1 tablet by mouth daily.     Black Cohosh 160 MG CAPS Take 160 mg by mouth daily.     Boswellia Serrata (BOSWELLIA PO) Take 375 mg by mouth daily.     budesonide -formoterol  (SYMBICORT ) 160-4.5 MCG/ACT inhaler Inhale 2 puffs into the lungs in the morning and at bedtime. 40.8 g 1   budesonide -glycopyrrolate -formoterol  (BREZTRI  AEROSPHERE) 160-9-4.8 MCG/ACT AERO inhaler Inhale 2 puffs into the lungs 2 (two) times daily. 10.7 g 2   carvedilol  (COREG ) 12.5 MG tablet Take 1 tablet (12.5 mg total) by mouth 2 (two) times daily in the morning and evening with a meal. 180 tablet 1   diclofenac  Sodium (VOLTAREN ) 1 % GEL APPLY 4 GRAMS 3 TIMES DAILY TO  AFFECTED AREA(S) (Patient taking differently: Apply 1 Application topically in the morning and at bedtime.) 500 g 2   diclofenac  Sodium (VOLTAREN ) 1 % GEL Apply 1 g topically 3 (three) times a day. 500 g 2   ergocalciferol (VITAMIN D2) 1.25 MG (50000 UT) capsule Take 50,000 Units by mouth once a week.     furosemide  (LASIX ) 40 MG tablet Take 1 tablet (40 mg total) by mouth daily. 30 tablet 0   Milk Thistle 150 MG CAPS Take 150 mg by mouth daily.     olmesartan  (BENICAR ) 40 MG tablet Take 1 tablet (40 mg total) by mouth daily. Stop lisinopril . 30 tablet 1   OVER THE COUNTER MEDICATION Take 2 capsules by mouth daily. FISH OIL OMEGA 3 VIT C VIT E 2000-650-12MG   pantoprazole  (PROTONIX ) 40 MG tablet Take 1 tablet (40 mg total) by mouth daily. 90 tablet 0   potassium chloride  SA (KLOR-CON  M) 20 MEQ tablet Take 1 tablet (20 mEq total) by mouth daily. 30 tablet 1   potassium chloride  SA (KLOR-CON  M) 20 MEQ tablet Take 2 tablets (40 mEq total) by mouth daily. 180 tablet 1   pravastatin  (PRAVACHOL ) 20 MG tablet TAKE 1 TABLET BY MOUTH EVERY EVENING (Patient taking differently: Take 20 mg by mouth every evening.) 90 tablet 2   pravastatin  (PRAVACHOL ) 20 MG tablet Take 1 tablet (20 mg total) by mouth nightly. 90 tablet 0   prednisoLONE  acetate (PRED FORTE ) 1 % ophthalmic suspension Place 1 drop into the right eye 4 (four) times daily. (Patient not taking: Reported on 05/31/2023) 10 mL 0   semaglutide -weight management (WEGOVY ) 0.25 MG/0.5ML SOAJ SQ injection Inject 0.25 mg into the skin every 7 (seven) days. 2 mL 0   Semaglutide -Weight Management (WEGOVY ) 0.25 MG/0.5ML SOAJ Inject 0.25 mg into the skin every 7 (seven) days. 2 mL 0   Semaglutide -Weight Management (WEGOVY ) 0.25 MG/0.5ML SOAJ Inject 0.25 mg into the skin every 7 days 2 mL 0   Spacer/Aero-Holding Chambers DEVI Use as directed 1 each 1   traMADol  (ULTRAM ) 50 MG tablet Take 1 tablet (50 mg total) by mouth every 6 (six) hours as needed for moderate  pain (4-6). 120 tablet 0   traZODone  (DESYREL ) 50 MG tablet Take 1 tablet (50 mg total) by mouth at bedtime. *stop zolpidem * (Patient taking differently: Take 50 mg by mouth at bedtime as needed for sleep.) 30 tablet 3   VEVYE 0.1 % SOLN Place 1 drop into both eyes 2 (two) times daily.     zolpidem  (AMBIEN  CR) 6.25 MG CR tablet Take 1 tablet (6.25 mg total) by mouth at bedtime as needed for sleep 30 tablet 0   No current facility-administered medications for this visit.     PHYSICAL EXAMINATION: ECOG PERFORMANCE STATUS: {CHL ONC ECOG PS:[id number]}  There were no vitals filed for this visit. There were no vitals filed for this visit.  GENERAL:alert, no distress and comfortable SKIN: skin color, texture, turgor are normal, no rashes or significant lesions EYES: normal, conjunctiva are pink and non-injected, sclera clear OROPHARYNX:no exudate, no erythema and lips, buccal mucosa, and tongue normal  NECK: supple, thyroid  normal size, non-tender, without nodularity LYMPH:  no palpable lymphadenopathy in the cervical, axillary or inguinal LUNGS: clear to auscultation and percussion with normal breathing effort HEART: regular rate & rhythm and no murmurs and no lower extremity edema ABDOMEN:abdomen soft, non-tender and normal bowel sounds Musculoskeletal:no cyanosis of digits and no clubbing  PSYCH: alert & oriented x 3 with fluent speech NEURO: no focal motor/sensory deficits  LABORATORY DATA:  I have reviewed the data as listed Lab Results  Component Value Date   WBC 7.4 06/02/2023   HGB 15.4 (H) 06/02/2023   HCT 46.9 (H) 06/02/2023   MCV 76.9 (L) 06/02/2023   PLT 320 06/02/2023     Chemistry      Component Value Date/Time   NA 136 06/02/2023 0245   K 3.3 (L) 06/02/2023 0245   CL 101 06/02/2023 0245   CO2 27 06/02/2023 0245   BUN 15 06/02/2023 0245   CREATININE 0.67 06/02/2023 0245      Component Value Date/Time   CALCIUM  10.1 06/02/2023 0245   ALKPHOS 79 05/21/2020 0457    AST 20 05/21/2020 0457   ALT 25 05/21/2020 0457  BILITOT 0.3 05/21/2020 0457       RADIOGRAPHIC STUDIES: I have personally reviewed the radiological images as listed and agreed with the findings in the report. No results found.  All questions were answered. The patient knows to call the clinic with any problems, questions or concerns. I spent *** minutes in the care of this patient including H and P, review of records, counseling and coordination of care.     Amber Stalls, MD 11/22/2023 10:50 AM

## 2023-11-22 NOTE — Progress Notes (Addendum)
 REFERRING PROVIDER: Joshua Santana CROME, NP [address] [place],  [place] [address]  PRIMARY PROVIDER:  Joshua Santana CROME, NP  PRIMARY REASON FOR VISIT:  1. Malignant neoplasm of lower-outer quadrant of left breast of female, estrogen receptor negative (HCC)   2. Family history of breast cancer   3. Family history of throat cancer   4. Family history of prostate cancer     HISTORY OF PRESENT ILLNESS:   Ashley Warren, a 68 y.o. female, was seen for a [facility] cancer genetics consultation at the request of Ashley Poli, MD due to a personal history of breast cancer. Ashley Warren presents today the at the [facility] to discuss the possibility of a hereditary predisposition to cancer, genetic testing, and to further clarify her future cancer risks, as well as potential cancer risks for family members.  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 It was ER and PR negative, HER2 positive, and had a Ki-67 of 10%. Biopsy of one of the lymph nodes is positive for metastatic breast cancer. There were 4 abnormal lymph nodes in the axilla as well on the left side.  CANCER HISTORY:  Oncology History  Malignant neoplasm of lower-outer quadrant of left breast of female, estrogen receptor negative (HCC)  11/20/2023 Initial Diagnosis   Malignant neoplasm of lower-outer quadrant of left breast of female, estrogen receptor negative (HCC)   11/22/2023 Cancer Staging   Staging form: Breast, AJCC 8th Edition - Clinical: Stage IIB (cT2, cN1, cM0, G3, ER-, PR-, HER2+) - Signed by Ashley Ash, MD on 11/22/2023 Histologic grading system: 3 grade system   12/06/2023 -  Chemotherapy   Patient is on Treatment Plan : BREAST  Docetaxel + Carboplatin + Trastuzumab + Pertuzumab  (TCHP) q21d        RISK FACTORS:  First live birth at age 82.  Hysterectomy: yes.  Menopausal  status: postmenopausal.  Mammogram within the last year: yes. Number of breast biopsies: 1. Tobacco Use: Never Past Medical History:  Diagnosis Date   Breast cancer (HCC)    Hypertension    Past Surgical History:  Procedure Laterality Date   ABDOMINAL HYSTERECTOMY     APPENDECTOMY     COSMETIC SURGERY     mass removed from shoulder/scapula area   Social History   Socioeconomic History   Marital status: Divorced    Spouse name: Not on file   Number of children: Not on file   Years of education: Not on file   Highest education level: Not on file  Occupational History   Not on file  Tobacco Use   Smoking status: Never   Smokeless tobacco: Never  Vaping Use   Vaping status: Never Used  Substance and Sexual Activity   Alcohol  use: Yes    Comment: ocially   Drug use: No   Sexual activity: Not on file  Other Topics Concern   Not on file  Social History Narrative   Not on file   Social Drivers of Health   Financial Resource Strain: Not on file  Food Insecurity: No Food Insecurity (05/31/2023)   Hunger Vital Sign    Worried About Running Out of Food in the Last Year: Never true    Ran Out of Food in the Last Year: Never true  Transportation Needs: No Transportation Needs (06/01/2023)   PRAPARE - Transportation    Lack of Transportation (  Medical): No    Lack of Transportation (Non-Medical): No  Physical Activity: Inactive (07/27/2023)   Received from [facility]   Exercise Vital Sign    On average, how many days per week do you engage in moderate to strenuous exercise (like a brisk walk)?: 0 days    On average, how many minutes do you engage in exercise at this level?: 0 min  Stress: Not on file  Social Connections: Unknown (06/01/2023)   Social Connection and Isolation Panel    Frequency of Communication with Friends and Family: More than three times a week    Frequency of Social Gatherings with Friends and Family: Not on file    Attends Religious  Services: Not on file    Active Member of Clubs or Organizations: Not on file    Attends Banker Meetings: Not on file    Marital Status: Not on file    FAMILY HISTORY:  We obtained a detailed, 4-generation family history pasted below.   Ashley Warren is unaware of relatives completing genetic testing for hereditary cancer risks.  The patient grew up in [place].  Significant diagnoses are listed below: Family History  Problem Relation Age of Onset   Alcohol  abuse Maternal Grandmother    Autism spectrum disorder Sister    Breast cancer Mother    Depression Sister    Hypertension Mother    Hypertension Sister    Osteoarthritis Maternal Paediatric Nurse Summary Half Sister - Unknown Cancer dx in 60s Mother - Breast Cancer dx at 23 with recent metastasis to bone cancer Maternal Uncle - Prostate Cancer at 46 Maternal 1st Cousin - Cancer of the vocal box/throat at 34  GENETIC COUNSELING ASSESSMENT: Ashley Warren is a 68 y.o. female with a personal and family history of cancer which is somewhat suggestive of a hereditary cancer predisposition syndrome. We, therefore, discussed and recommended the following at today's visit.   DISCUSSION: We discussed that, in general, most cancer is not inherited in families, but instead is sporadic or familial. Sporadic cancers occur by chance and typically happen at older ages (>50 years) as this type of cancer is caused by genetic changes acquired during an individual's lifetime. Some families have more cancers than would be expected by chance; however, the ages or types of cancer are not consistent with a known genetic mutation or known genetic mutations have been ruled out. This type of familial cancer is thought to be due to a combination of multiple genetic, environmental, hormonal, and lifestyle factors. While this combination of factors likely increases the risk of cancer, the exact source of this risk is not currently  identifiable or testable.  We discussed that 5-10% of cancer is the result of germline (heritable) genetic variants. with most cases associated with BRCA1/BRCA2. There are other genes that can be associated with hereditary cancer syndromes. We discussed that testing is beneficial for several reasons including knowing how to follow individuals after completing their treatment, identifying whether potential treatment options such as PARP inhibitors would be beneficial, and understanding if other family members could be at risk for cancer and allow them to undergo genetic testing.   We reviewed the characteristics, features and inheritance patterns of hereditary cancer syndromes. We also discussed genetic testing, including the appropriate family members to test, the process of testing, insurance coverage and turn-around-time for results. We discussed the implications of a negative, positive, carrier and/or variant of uncertain significant result. Ashley Warren  was  offered a common hereditary cancer panel (40 genes) and an expanded pan-cancer panel (70 genes). Ashley Warren was informed of the benefits and limitations of each panel, including that expanded pan-cancer panels contain genes that do not have clear management guidelines at this point in time.  We also discussed that as the number of genes included on a panel increases, the chances of variants of uncertain significance increases.  GENETIC TESTING NATIONAL CRITERIA: Based on Ashley Warren's personal and family history of having three people in the family with breast or prostate cancer. She meets medical criteria for genetic testing based on the [employer] (NCCN) guidelines. Despite that she meets criteria, she may still have an out of pocket cost. We discussed that if her out of pocket cost for testing is over $100, the laboratory will call and confirm whether she wants to proceed with testing.  If the out of  pocket cost of testing is less than $100 she will be billed by the genetic testing laboratory.   GENETIC TESTING CONSENT:  After considering the risks, benefits, and limitations, Ashley Warren did NOT provide informed consent to pursue genetic testing.  She expressed that she feels too overwhelmed and this time and may wish to pursue genetic testing later on. We discussed that this is very understandable and they she can pursue genetic testing at a later  point. Ashley Warren is encouraged to reach out when and if she would like to pursue genetic testing and to remain in contact with cancer genetics annually so that we can continuously update the family history and inform her of any changes in cancer genetics and testing that may be of benefit for this family.   Ashley Warren's questions were answered to her satisfaction today. Our contact information was provided should additional questions or concerns arise. Thank you for the referral and allowing us  to share in the care of your patient.   Resources:  Ashley Warren was provided with the following:  [facility] Hereditary Cancer Testing Patient Guide PLAN:  Testing Ordered: Testing declined and no testing ordered today Clinic Note Faxed/Routed to Ashley Warren's PCP Ashley Santana CROME, NP   Santana Fryer, MS, [facility]  Certified Genetic Counselor  Email: [email or web address]  Phone: [phone]  I personally spent a total of 20 minutes in the care of the patient today including preparing to see the patient, counseling and educating, and documenting clinical information in the EHR. The patient brought her sister, Ashley Warren (biologically her maternal half sister). Drs. Lanny Stalls, and/or Gudena were available for questions, if needed. _______________________________________________________________________ For Office Staff:  Number of people involved in session: 2 Was an Intern/ student involved with case: no

## 2023-11-22 NOTE — Therapy (Signed)
 OUTPATIENT PHYSICAL THERAPY BREAST CANCER BASELINE EVALUATION   Patient Name: Ashley Warren MRN: [id number] DOB:30-Nov-1955, 68 y.o., female Today's Date: 11/22/2023  END OF SESSION:  PT End of Session - 11/22/23 1153     Visit Number 1    Number of Visits 2    Date for Recertification  05/21/24    PT Start Time 1239    PT Stop Time 1308    PT Time Calculation (min) 29 min    Activity Tolerance Patient tolerated treatment well    Behavior During Therapy WFL for tasks assessed/performed          Past Medical History:  Diagnosis Date   Breast cancer (HCC)    Hypertension    Past Surgical History:  Procedure Laterality Date   ABDOMINAL HYSTERECTOMY     APPENDECTOMY     COSMETIC SURGERY     mass removed from shoulder/scapula area   Patient Active Problem List   Diagnosis Date Noted   Malignant neoplasm of lower-outer quadrant of left breast of female, estrogen receptor negative (HCC) 11/20/2023   Acute diastolic heart failure (HCC) 05/31/2023   HTN (hypertension) 05/21/2020   Low back pain 04/29/2015   Lumbar facet arthropathy 04/29/2015   Trochanteric bursitis of both hips 04/29/2015   Morbid obesity (HCC) 04/29/2015    REFERRING PROVIDER: Dr. Vicenta Poli  REFERRING DIAG: Left breast cancer   THERAPY DIAG:  Malignant neoplasm of lower-outer quadrant of left breast of female, estrogen receptor negative (HCC)  Abnormal posture  Rationale for Evaluation and Treatment: Rehabilitation  ONSET DATE: 10/26/2023  SUBJECTIVE:                                                                                                                                                                                           SUBJECTIVE STATEMENT: Patient reports she is here today to be seen by her medical team for her newly diagnosed left breast cancer.   PERTINENT HISTORY:  Patient was diagnosed on 10/26/2023 with left grade 3 invasive ductal carcinoma breast cancer. It  measures 2.6 cm and is located in the lower outer quadrant. It is ER/PR negative and HER2 positive with a Ki67 of 10%. Patient reports having active CHF and HTN.  PATIENT GOALS:   reduce lymphedema risk and learn post op HEP.   PAIN:  Are you having pain? Yes: NPRS scale: 8/10 Pain location: back and legs (sciatica) Pain description: achy Aggravating factors: standing and walking Relieving factors: sitting  PRECAUTIONS: Active CA   RED FLAGS: None   HAND DOMINANCE: right  WEIGHT BEARING RESTRICTIONS: No  FALLS:  Has patient fallen in  last 6 months? No  LIVING ENVIRONMENT: Patient lives with: her Mom, daughter, and daughter-in-law Lives in: House/apartment Has following equipment at home: None  OCCUPATION: telemetry tech at [employer]: She repots doing some seated exercises and some exercises in bed  PRIOR LEVEL OF FUNCTION: Independent   OBJECTIVE: Note: Objective measures were completed at Evaluation unless otherwise noted.  COGNITION: Overall cognitive status: Within functional limits for tasks assessed    POSTURE:  Forward head and rounded shoulders posture  UPPER EXTREMITY AROM/PROM:  A/PROM RIGHT   eval   Shoulder extension 50  Shoulder flexion 141  Shoulder abduction 132  Shoulder internal rotation 52  Shoulder external rotation 73    (Blank rows = not tested)  A/PROM LEFT   eval  Shoulder extension 52  Shoulder flexion 142  Shoulder abduction 148  Shoulder internal rotation 58  Shoulder external rotation 75    (Blank rows = not tested)  CERVICAL AROM: All within normal limits  UPPER EXTREMITY STRENGTH: Functional  LYMPHEDEMA ASSESSMENTS (in cm):   LANDMARK RIGHT   eval  10 cm proximal to olecranon process from proximal aspect of olecranon 55.7  Olecranon process 33.5  10 cm proximal to ulnar styloid process from proximal aspect of styloid process 31.2  Just distal to ulnar styloid process 22.1  Across hand at thumb web  space 22.4  At base of 2nd digit 7.4  (Blank rows = not tested)  LANDMARK LEFT   eval  10 cm proximal to olecranon process from proximal aspect of olecranon 52.7   Olecranon process 34.4  10 cm proximal to ulnar styloid process from proximal aspect of styloid process 30.2  Just distal to ulnar styloid process 22.3  Across hand at thumb web space 21.2  At base of 2nd digit 7.5  (Blank rows = not tested)  L-DEX LYMPHEDEMA SCREENING:  The patient was assessed using the L-Dex machine today to produce a lymphedema index baseline score. The patient will be reassessed on a regular basis (typically every 3 months) to obtain new L-Dex scores. If the score is > 6.5 points away from his/her baseline score indicating onset of subclinical lymphedema, it will be recommended to wear a compression garment for 4 weeks, 12 hours per day and then be reassessed. If the score continues to be > 6.5 points from baseline at reassessment, we will initiate lymphedema treatment. Assessing in this manner has a 95% rate of preventing clinically significant lymphedema.   L-DEX FLOWSHEETS - 11/22/23 1100       L-DEX LYMPHEDEMA SCREENING   Measurement Type Unilateral    L-DEX MEASUREMENT EXTREMITY Upper Extremity    POSITION  Standing    DOMINANT SIDE Right    At Risk Side Left    BASELINE SCORE (UNILATERAL) 4.3          QUICK DASH SURVEY: Pt did not complete  PATIENT EDUCATION:  Education details: Time spent educating patient on aspects of self-care to maximize post op recovery. Patient was educated on where and how to get a post op compression bra to use to reduce post op edema. Patient was also educated on the use of SOZO screenings and surveillance principles for early identification of lymphedema onset. She was instructed to use the post op pillow in the axilla for pressure and pain relief. Patient educated on lymphedema risk reduction and post op shoulder/posture HEP. Person educated: Patient Education  method: Explanation, Demonstration, Handout Education comprehension: Patient verbalized understanding and returned demonstration  HOME  EXERCISE PROGRAM: Patient was instructed today in a home exercise program today for post op shoulder range of motion. These included active assist shoulder flexion in sitting, scapular retraction, wall walking with shoulder abduction, and hands behind head external rotation.  She was encouraged to do these twice a day, holding 3 seconds and repeating 5 times when permitted by her physician.   ASSESSMENT:  CLINICAL IMPRESSION: Patient was diagnosed on 10/26/2023 with left grade 3 invasive ductal carcinoma breast cancer. It measures 2.6 cm and is located in the lower outer quadrant. It is ER/PR negative and HER2 positive with a Ki67 of 10%.Her multidisciplinary medical team met prior to her assessments to determine a recommended treatment plan. She is planning to have neoadjuvant chemotherapy followed by a left lumpectomy and targeted axillary lymph node dissection, radiation, and anti-estrogen therapy. She may consider having bilateral mastectomies She will benefit from a post op PT reassessment to determine needs and from L-Dex screens every 3 months for 2 years to detect subclinical lymphedema.  Pt will benefit from skilled therapeutic intervention to improve on the following deficits: Decreased knowledge of precautions, impaired UE functional use, pain, decreased ROM, postural dysfunction.   PT treatment/interventions: ADL/self-care home management, pt/family education, therapeutic exercise  REHAB POTENTIAL: Good  CLINICAL DECISION MAKING: Stable/uncomplicated  EVALUATION COMPLEXITY: Low   GOALS: Goals reviewed with patient? YES  LONG TERM GOALS: (STG=LTG)    Name Target Date Goal status  1 Pt will be able to verbalize understanding of pertinent lymphedema risk reduction practices relevant to her dx specifically related to skin care.  Baseline:  No  knowledge 11/22/2023 Achieved at eval  2 Pt will be able to return demo and/or verbalize understanding of the post op HEP related to regaining shoulder ROM. Baseline:  No knowledge 11/22/2023 Achieved at eval  3 Pt will be able to verbalize understanding of the importance of viewing the post op After Breast CA Class video for further lymphedema risk reduction education and therapeutic exercise.  Baseline:  No knowledge 11/22/2023 Achieved at eval  4 Pt will demo she has regained full shoulder ROM and function post operatively compared to baselines.  Baseline: See objective measurements taken today. 05/21/2024     PLAN:  PT FREQUENCY/DURATION: EVAL and 1 follow up appointment.   PLAN FOR NEXT SESSION: will reassess 3-4 weeks post op to determine needs.   Patient will follow up at outpatient cancer rehab 3-4 weeks following surgery.  If the patient requires physical therapy at that time, a specific plan will be dictated and sent to the referring physician for approval. The patient was educated today on appropriate basic range of motion exercises to begin post operatively and the importance of viewing the After Breast Cancer class video following surgery.  Patient was educated today on lymphedema risk reduction practices as it pertains to recommendations that will benefit the patient immediately following surgery.  She verbalized good understanding.    Physical Therapy Information for After Breast Cancer Surgery/Treatment:  Lymphedema is a swelling condition that you may be at risk for in your arm if you have lymph nodes removed from the armpit area.  After a sentinel node biopsy, the risk is approximately 5-9% and is higher after an axillary node dissection.  There is treatment available for this condition and it is not life-threatening.  Contact your physician or physical therapist with concerns. You may begin the 4 shoulder/posture exercises (see additional sheet) when permitted by your physician  (typically a week after surgery).  If you have drains, you may need to wait until those are removed before beginning range of motion exercises.  A general recommendation is to not lift your arms above shoulder height until drains are removed.  These exercises should be done to your tolerance and gently.  This is not a no pain/no gain type of recovery so listen to your body and stretch into the range of motion that you can tolerate, stopping if you have pain.  If you are having immediate reconstruction, ask your plastic surgeon about doing exercises as he or she may want you to wait. We encourage you to view the After Breast Cancer class video following surgery.  You will learn information related to lymphedema risk, prevention and treatment and additional exercises to regain mobility following surgery.   While undergoing any medical procedure or treatment, try to avoid blood pressure being taken or needle sticks from occurring on the arm on the side of cancer.   This recommendation begins after surgery and continues for the rest of your life.  This may help reduce your risk of getting lymphedema (swelling in your arm). An excellent resource for those seeking information on lymphedema is the National Lymphedema Network's web site. It can be accessed at www.lymphnet.org If you notice swelling in your hand, arm or breast at any time following surgery (even if it is many years from now), please contact your doctor or physical therapist to discuss this.  Lymphedema can be treated at any time but it is easier for you if it is treated early on.  If you feel like your shoulder motion is not returning to normal in a reasonable amount of time, please contact your surgeon or physical therapist.  [facility] [phone]. [address], [address], [place] [address]  [facility] CLASS After Breast Cancer Class  After Breast Cancer Class is a specially designed exercise class video to  assist you in a safe recover after having breast cancer surgery.  In this video you will learn how to get back to full function whether your drains were just removed or if you had surgery a month ago. The video can be viewed on this page: [email or web address].boyd-meyer.org/ or on YouTube here: https://youtu.[email or web address].  Class Goals  Understand specific stretches to improve the flexibility of you chest and shoulder. Learn ways to safely strengthen your upper body and improve your posture. Understand the warning signs of infection and why you may be at risk for an arm infection. Learn about Lymphedema and prevention.  ** You do not need to view this video until after surgery.  Drains should be removed to participate in the recommended exercises on the video.  Patient was instructed today in a home exercise program today for post op shoulder range of motion. These included active assist shoulder flexion in sitting, scapular retraction, wall walking with shoulder abduction, and hands behind head external rotation.  She was encouraged to do these twice a day, holding 3 seconds and repeating 5 times when permitted by her physician.  Eward Wonda Sharps, [place] 11/22/23 2:15 PM

## 2023-11-23 ENCOUNTER — Inpatient Hospital Stay: Admitting: Licensed Clinical Social Worker

## 2023-11-23 ENCOUNTER — Telehealth: Payer: Self-pay

## 2023-11-23 ENCOUNTER — Other Ambulatory Visit: Payer: Self-pay

## 2023-11-23 DIAGNOSIS — C50512 Malignant neoplasm of lower-outer quadrant of left female breast: Secondary | ICD-10-CM

## 2023-11-23 NOTE — Progress Notes (Signed)
 [facility] Clinical Social Work  Initial Assessment   Ashley Warren is a 68 y.o. year old female contacted by phone. Clinical Social Work was referred by [facility] for assessment of psychosocial needs.   SDOH (Social Determinants of Health) assessments performed: Yes SDOH Interventions    Flowsheet Row Clinical Support from 11/23/2023 in [facility] - A Dept Of [facility]  SDOH Interventions   Food Insecurity Interventions Intervention Not Indicated  Housing Interventions Intervention Not Indicated  Transportation Interventions Intervention Not Indicated  Utilities Interventions Intervention Not Indicated    SDOH Screenings   Food Insecurity: No Food Insecurity (11/23/2023)  Housing: Low Risk  (11/23/2023)  Transportation Needs: No Transportation Needs (11/23/2023)  Utilities: Not At Risk (11/23/2023)  Depression (PHQ2-9): Low Risk  (11/23/2023)  Physical Activity: Inactive (07/27/2023)   Received from [facility]  Social Connections: Unknown (06/01/2023)  Tobacco Use: Low Risk  (11/22/2023)   Received from [facility]    PHQ 2/9:    11/23/2023   11:46 AM 04/29/2014    2:46 PM  Depression screen PHQ 2/9  Decreased Interest 0 0  Down, Depressed, Hopeless 0 0  PHQ - 2 Score 0 0  Altered sleeping  0  Tired, decreased energy  0  Change in appetite  0  Feeling bad or failure about yourself   0  Trouble concentrating  0  Moving slowly or fidgety/restless  0  Suicidal thoughts  0   PHQ-9 Score  0   Difficult doing work/chores  --     Data saved with a previous flowsheet row definition     Distress Screen completed: No     No data to display            Family/Social Information:  Housing Arrangement: patient lives with her mom, daughter, and daughter in law Family members/support persons in your life? Family, Friends, and Art Gallery Manager concerns: no  Employment: Working full time as a  transport planner for [employer]. She is contemplating retiring vs going PRN vs continuing to work. She does have a supportive team and FMLA available  Income source: Employment Financial concerns: No Type of concern: None Food access concerns: no Religious or spiritual practice: Alfornia Handler faith is extremely important to her and is what she turns to to help make decisions Advanced directives: Not known Services Currently in place:  [employer]  Coping/ Adjustment to diagnosis: Patient understands treatment plan and what happens next? yes, she was overwhelmed with the information during clinic yesterday, but does understand the proposed treatment plan. She is taking time to pray about it and talk with her family to decide on what she wants to do Patient reported stressors: Adjusting to my illness Hopes and/or priorities: pt's faith and following her faith is of utmost importance to her Current coping skills/ strengths: Manufacturing systems engineer , Religious Affiliation , and Supportive family/friends     SUMMARY: Current SDOH Barriers:  No major barriers identified today  Clinical Social Work Clinical Goal(s):  No clinical social work goals at this time  Interventions: Discussed common feeling and emotions when being diagnosed with cancer, and the importance of support during treatment Informed patient of the support team roles and support services at [facility] Provided CSW contact information and encouraged patient to call with any questions or concerns   Follow Up Plan: Patient will contact CSW with any support or resource needs Patient verbalizes understanding of plan: Yes  Ladarren Steiner E Becker Christopher, LCSW Clinical Social Worker [facility]

## 2023-11-23 NOTE — Telephone Encounter (Signed)
 [employer] 2021-05 - Specimen Collection Study to Evaluate Biomarkers in Subjects with Cancer    Patient Ashley Warren was identified by Dr. Loretha as a potential candidate for the above listed study.  This Clinical Research Coordinator spoke with Ashley Warren, [id number], via phone in a manner that ensures patient privacy to discuss participation in the above listed research study.  A copy of the informed consent document with embedded HIPAA language was offered to the patient.  Patient reads, speaks, and understands English.   Approximately 10 minutes were spent with the patient reviewing the study.  Patient was provided the option of receiving informed consent documents via email to review. Patient declined study interest. Patient has contact information if she has any questions. Dr. Loretha notified.  Laury Quale, MPH  Clinical Research Coordinator

## 2023-11-23 NOTE — Telephone Encounter (Signed)
 D7794, ICE COMPRESS: RANDOMIZED TRIAL OF LIMB CRYOCOMPRESSION VERSUS CONTINUOUS COMPRESSION VERSUS LOW CYCLIC COMPRESSION FOR THE PREVENTION  OF TAXANE-INDUCED PERIPHERAL NEUROPATHY   Patient Ashley Warren was identified by Dr. Loretha as a potential candidate for the above listed study.  This Clinical Research Coordinator spoke with Gabrielly O Hua, [id number], via phone in a manner that ensures patient privacy to discuss participation in the above listed research study.  A copy of the informed consent document with embedded HIPAA language was offered to the patient.  Patient reads, speaks, and understands English.   Approximately 10 minutes were spent with the patient reviewing the study.  Patient was provided the option of receiving informed consent documents via email to review. Patient declined study interest. Patient has contact information if she has any questions. Dr. Loretha notified.  Laury Quale, MPH  Clinical Research Coordinator

## 2023-11-24 ENCOUNTER — Other Ambulatory Visit: Payer: Self-pay | Admitting: Hematology and Oncology

## 2023-11-27 ENCOUNTER — Other Ambulatory Visit: Payer: Self-pay

## 2023-11-27 ENCOUNTER — Other Ambulatory Visit (HOSPITAL_COMMUNITY): Payer: Self-pay

## 2023-11-27 MED ORDER — FLUTICASONE PROPIONATE 50 MCG/ACT NA SUSP
2.0000 | Freq: Every day | NASAL | 0 refills | Status: AC
  Filled 2023-11-27: qty 48, 90d supply, fill #0

## 2023-11-29 ENCOUNTER — Telehealth: Payer: Self-pay | Admitting: *Deleted

## 2023-11-29 ENCOUNTER — Encounter: Payer: Self-pay | Admitting: *Deleted

## 2023-11-29 NOTE — Telephone Encounter (Signed)
 Spoke with patient to follow up from [facility] and assess navigation needs.  Reviewed patient's schedule and upcoming appts. Encouraged her to call should she have any additional questions. Patient verbalized understanding.

## 2023-11-30 ENCOUNTER — Other Ambulatory Visit (HOSPITAL_COMMUNITY): Payer: Self-pay | Admitting: Nurse Practitioner

## 2023-11-30 ENCOUNTER — Telehealth: Payer: Self-pay

## 2023-11-30 ENCOUNTER — Other Ambulatory Visit: Payer: Self-pay

## 2023-11-30 ENCOUNTER — Encounter: Payer: Self-pay | Admitting: Hematology and Oncology

## 2023-11-30 DIAGNOSIS — C50512 Malignant neoplasm of lower-outer quadrant of left female breast: Secondary | ICD-10-CM

## 2023-11-30 DIAGNOSIS — R0609 Other forms of dyspnea: Secondary | ICD-10-CM

## 2023-11-30 NOTE — Telephone Encounter (Signed)
 DCP-001: Use of a Clinical Trial Screening Tool to Address Cancer Health Disparities in the [employer] Research Program [facility])   Patient returned call and declined one time questionnaire. Patient has been provided contact information if she has any questions.  Laury Quale, MPH  Clinical Research Coordinator

## 2023-11-30 NOTE — Telephone Encounter (Signed)
 T/c with pt on 11/28/23. Discussed abnormal PFT showing asthma/obstruction with response. To inhaler.  Reports she has had dyspnea with exertion since having hospitalization for rsv. Does have hx of CHF but improved and she has fu with cardiology In near future. She has not had CT scan. Will do CT chest. Is in process of being tx for stage 3 breast cancer. Has to start chemo. Will order ct. Pt to fu with cardiology, to ED if worsening.

## 2023-11-30 NOTE — Telephone Encounter (Signed)
 DCP-001: Use of a Clinical Trial Screening Tool to Address Cancer Health Disparities in the [employer] Community [employer]cology Research Program [facility])   Called patient left voicemail about this one time questionnaire study. Patient provided with contact information. Will follow patient on study interest.  Laury Quale, MPH  Clinical Research Coordinator

## 2023-12-02 ENCOUNTER — Ambulatory Visit (HOSPITAL_BASED_OUTPATIENT_CLINIC_OR_DEPARTMENT_OTHER)
Admission: RE | Admit: 2023-12-02 | Discharge: 2023-12-02 | Disposition: A | Discharge status: Home / Self Care | Source: Ambulatory Visit | Attending: Nurse Practitioner | Admitting: Nurse Practitioner

## 2023-12-02 DIAGNOSIS — R0602 Shortness of breath: Secondary | ICD-10-CM | POA: Diagnosis not present

## 2023-12-02 DIAGNOSIS — R0609 Other forms of dyspnea: Secondary | ICD-10-CM | POA: Diagnosis not present

## 2023-12-02 DIAGNOSIS — I700 Atherosclerosis of aorta: Secondary | ICD-10-CM | POA: Diagnosis not present

## 2023-12-04 ENCOUNTER — Other Ambulatory Visit (HOSPITAL_COMMUNITY): Payer: Self-pay

## 2023-12-04 ENCOUNTER — Other Ambulatory Visit: Payer: Self-pay

## 2023-12-04 MED ORDER — OLMESARTAN MEDOXOMIL 40 MG PO TABS
40.0000 mg | ORAL_TABLET | Freq: Every day | ORAL | 1 refills | Status: DC
  Filled 2023-12-04: qty 30, 30d supply, fill #0
  Filled 2024-01-05: qty 30, 30d supply, fill #1

## 2023-12-04 NOTE — Unmapped External Note (Signed)
 Diagnosed with stage 3 Cancer.

## 2023-12-05 ENCOUNTER — Ambulatory Visit (HOSPITAL_COMMUNITY)
Admission: RE | Admit: 2023-12-05 | Discharge: 2023-12-05 | Disposition: A | Discharge status: Home / Self Care | Source: Ambulatory Visit | Attending: Hematology and Oncology | Admitting: Hematology and Oncology

## 2023-12-05 ENCOUNTER — Other Ambulatory Visit: Payer: Self-pay

## 2023-12-05 ENCOUNTER — Inpatient Hospital Stay: Admitting: Pharmacist

## 2023-12-05 ENCOUNTER — Inpatient Hospital Stay

## 2023-12-05 ENCOUNTER — Encounter: Payer: Self-pay | Admitting: Hematology and Oncology

## 2023-12-05 ENCOUNTER — Other Ambulatory Visit: Payer: Self-pay | Admitting: Radiology

## 2023-12-05 ENCOUNTER — Other Ambulatory Visit (HOSPITAL_COMMUNITY): Payer: Self-pay

## 2023-12-05 VITALS — BP 160/90 | HR 60 | Temp 98.0°F | Resp 20 | Wt 340.4 lb

## 2023-12-05 DIAGNOSIS — I119 Hypertensive heart disease without heart failure: Secondary | ICD-10-CM | POA: Diagnosis not present

## 2023-12-05 DIAGNOSIS — E669 Obesity, unspecified: Secondary | ICD-10-CM | POA: Insufficient documentation

## 2023-12-05 DIAGNOSIS — I110 Hypertensive heart disease with heart failure: Secondary | ICD-10-CM | POA: Diagnosis not present

## 2023-12-05 DIAGNOSIS — Z171 Estrogen receptor negative status [ER-]: Secondary | ICD-10-CM | POA: Diagnosis not present

## 2023-12-05 DIAGNOSIS — Z79899 Other long term (current) drug therapy: Secondary | ICD-10-CM

## 2023-12-05 DIAGNOSIS — C50512 Malignant neoplasm of lower-outer quadrant of left female breast: Secondary | ICD-10-CM | POA: Diagnosis not present

## 2023-12-05 DIAGNOSIS — Z803 Family history of malignant neoplasm of breast: Secondary | ICD-10-CM | POA: Diagnosis not present

## 2023-12-05 DIAGNOSIS — M1990 Unspecified osteoarthritis, unspecified site: Secondary | ICD-10-CM | POA: Diagnosis not present

## 2023-12-05 LAB — ECHOCARDIOGRAM COMPLETE
Area-P 1/2: 3.03 cm2
S' Lateral: 2.5 cm

## 2023-12-05 MED ORDER — DEXAMETHASONE 4 MG PO TABS
ORAL_TABLET | ORAL | 1 refills | Status: AC
  Filled 2023-12-05: qty 30, 15d supply, fill #0

## 2023-12-05 MED ORDER — ONDANSETRON HCL 8 MG PO TABS
8.0000 mg | ORAL_TABLET | Freq: Three times a day (TID) | ORAL | 1 refills | Status: AC | PRN
  Filled 2023-12-05: qty 30, 10d supply, fill #0

## 2023-12-05 MED ORDER — PROCHLORPERAZINE MALEATE 10 MG PO TABS
10.0000 mg | ORAL_TABLET | Freq: Four times a day (QID) | ORAL | 1 refills | Status: AC | PRN
Start: 2023-12-05 — End: ?
  Filled 2023-12-05: qty 30, 8d supply, fill #0

## 2023-12-05 MED ORDER — LIDOCAINE-PRILOCAINE 2.5-2.5 % EX CREA
TOPICAL_CREAM | CUTANEOUS | 3 refills | Status: AC
  Filled 2023-12-05: qty 30, 30d supply, fill #0

## 2023-12-05 NOTE — Progress Notes (Signed)
 [facility]        Telephone: [phone]?Fax: [phone]   Oncology Clinical Pharmacist Practitioner Initial Assessment   Ashley Warren is a 68 y.o. female with a diagnosis of breast cancer. They were contacted today via in-person visit.  We discussed that with regard to vitamins, minerals, and herbal medications, it is important to realize that these are not tested or regulated by the FDA. Therefore, they have not been studied as extensively as prescription medications, and their safety profile is not as well known. In addition, their purity cannot be guaranteed. Many studies have shown that the ingredients listed on a bottle of an herbal supplement may not actually be contained inside.   The [employer] website: vegetablebeverage.com.cy may provide some useful information.     She will hold herbal supplements until finished with chemotherapy and information was given to patient regarding potential concerns  Indication/Regimen TCHP: Trastuzumab (Herceptin) and pertuzumab (Perjeta) and docetaxel (Taxotere) and carboplatin (Paraplatin) are being used appropriately for treatment of breast cancer by Dr. Amber Stalls.      Wt Readings from Last 1 Encounters:  11/22/23 (!) 348 lb 14.4 oz (158.3 kg)    Estimated body surface area is 2.63 meters squared as calculated from the following:   Height as of 05/31/23: 5' 2 (1.575 m).   Weight as of 11/22/23: 348 lb 14.4 oz (158.3 kg).  The dosing regimen is every 21 days for 6 cycles  Trastuzumab (8 mg/kg load, 6 mg/kg maintenance) on Day 1 Pertuzumab (840 mg load, 420 mg maintenance) on Day 1 Docetaxel (75 mg/m2) on Day 1 Carboplatin (AUC 6) on Day 1 Pegfilgrastim (6 mg) on Day 3  Dose Modifications Dr. Stalls is reducing the docetaxel to 50 mg/m2 and the carboplatin to AUC 4   Allergies Allergies  Allergen Reactions    Penicillins Itching    Vitals    11/22/2023   11:17 AM 06/02/2023    8:27 AM 06/02/2023    4:15 AM  Oncology Vitals  Weight 158.26 kg  153.1 kg  Weight (lbs) 348 lbs 14 oz  337 lbs 8 oz  BMI 63.81 kg/m2  61.73 kg/m2  Temp 97.7 F (36.5 C) 98.3 F (36.8 C) 98.3 F (36.8 C)  Pulse Rate 90 75 74  BP 171/103 138/74 146/56  Resp 18 20 20   SpO2 95 % 97 % 96 %  BSA (m2) 2.63 m2  2.59 m2     Laboratory Data    Latest Ref Rng & Units 11/22/2023    2:13 PM 06/02/2023    2:45 AM 05/31/2023   12:50 PM  CBC EXTENDED  WBC 4.0 - 10.5 K/uL 6.5  7.4  8.2   RBC 3.87 - 5.11 MIL/uL 6.11  6.10  5.45   Hemoglobin 12.0 - 15.0 g/dL 84.6  84.5  86.0   HCT 36.0 - 46.0 % 47.1  46.9  41.8   Platelets 150 - 400 K/uL 247  320  283   NEUT# 1.7 - 7.7 K/uL 3.2     Lymph# 0.7 - 4.0 K/uL 2.3          Latest Ref Rng & Units 11/22/2023    2:13 PM 06/02/2023    2:45 AM 06/01/2023    9:31 AM  CMP  Glucose 70 - 99 mg/dL 887  879  886   BUN 8 - 23 mg/dL 11  15  11    Creatinine 0.60 - 1.20 mg/dL 9.39  9.32  9.41  Sodium 135 - 145 mmol/L 139  136  137   Potassium 3.5 - 5.1 mmol/L 3.7  3.3  3.4   Chloride 98 - 111 mmol/L 103  101  99   CO2 22 - 32 mmol/L 32  27  24   Calcium  8.9 - 10.3 mg/dL 9.6  89.8  89.9   Total Protein 6.5 - 8.1 g/dL 7.7     Total Bilirubin 0.2 - 1.6 mg/dL 0.5     Alkaline Phos 38 - 126 U/L 125     AST 11 - 38 U/L 18     ALT 10 - 47 U/L 18      Contraindications Contraindications were reviewed? Yes Contraindications to therapy were identified? No   Safety Precautions (written information also provided) The following safety precautions for the use of TCHP were reviewed:  Decreased hemoglobin, part of the red blood cells that carry iron and oxygen  Decreased platelet count and increased risk of bleeding Decreased white blood cells (WBCs) and increased risk for infection Fever: reviewed the importance of having a thermometer and the Centers for Disease Control and Prevention (CDC)  definition of fever which is 100.44F (38C) or higher. Patient should call 24/7 triage at [phone] if experiencing a fever or any other symptoms Hair Loss Muscle or joint pain or weakness Fatigue Nausea or vomiting Mouth sores Diarrhea Irregular menses (if applicable) Peripheral neuropathy: numbness or tingling in hands and feet Fluid retention or swelling (edema) Nail Changes Rash or itchy skin Fluid retention or swelling (edema) Taste changes Changes in kidney function Changes in electrolytes and other laboratory values (low potassium, low magnesium ) Cardiotoxicity from trastuzumab Infusion reactions Pneumonitis from trastuzumab Docetaxel irritating veins Liver toxicity from docetaxel Docetaxel can cause eye pain, blurred vision, tearing, and light sensitivity Handling body fluids and waste Intimacy, sexual activity, contraception, fertility  Medication Reconciliation Current Outpatient Medications  Medication Sig Dispense Refill   albuterol  (PROVENTIL ) (2.5 MG/3ML) 0.083% nebulizer solution Inhale 3 mL (2.5 mg total) into the lungs via nebulization every 8 (eight) hours as needed for wheezing or shortness of breath. (Patient taking differently: Take 2.5 mg by nebulization every 8 (eight) hours as needed for wheezing or shortness of breath.) 180 mL 0   albuterol  (VENTOLIN  HFA) 108 (90 Base) MCG/ACT inhaler Inhale 2 puffs every 6 (six) hours as needed for wheezing (cough). (Patient taking differently: Inhale 2 puffs into the lungs in the morning and at bedtime.) 6.7 g 1   albuterol  (VENTOLIN  HFA) 108 (90 Base) MCG/ACT inhaler Inhale 2 puffs into the lungs every 6 (six) hours as needed. 6.7 g 1   albuterol  (VENTOLIN  HFA) 108 (90 Base) MCG/ACT inhaler Inhale 2 puffs into the lungs every 6 (six) hours as needed for wheezing (cough). 6.7 g 1   amLODipine  (NORVASC ) 10 MG tablet Take 1 tablet (10 mg total) by mouth daily. 90 tablet 1   Ascorbic Acid  (VITAMIN C CR) 500 MG CPCR Take  500 mg by mouth daily.     B Complex Vitamins (B COMPLEX PO) Take 1 tablet by mouth daily.     BACILLUS COAGULANS-INULIN PO Take 1 tablet by mouth daily.     Black Cohosh 160 MG CAPS Take 160 mg by mouth daily.     Boswellia Serrata (BOSWELLIA PO) Take 375 mg by mouth daily.     budesonide -formoterol  (SYMBICORT ) 160-4.5 MCG/ACT inhaler Inhale 2 puffs into the lungs in the morning and at bedtime. 40.8 g 1   budesonide -glycopyrrolate -formoterol  (BREZTRI  AEROSPHERE) 160-9-4.8 MCG/ACT  AERO inhaler Inhale 2 puffs into the lungs 2 (two) times daily. 10.7 g 2   carvedilol  (COREG ) 12.5 MG tablet Take 1 tablet (12.5 mg total) by mouth 2 (two) times daily in the morning and evening with a meal. 180 tablet 1   diclofenac  Sodium (VOLTAREN ) 1 % GEL APPLY 4 GRAMS 3 TIMES DAILY TO AFFECTED AREA(S) (Patient taking differently: Apply 1 Application topically in the morning and at bedtime.) 500 g 2   diclofenac  Sodium (VOLTAREN ) 1 % GEL Apply 1 g topically 3 (three) times a day. 500 g 2   ergocalciferol (VITAMIN D2) 1.25 MG (50000 UT) capsule Take 50,000 Units by mouth once a week.     fluticasone  (FLONASE ) 50 MCG/ACT nasal spray Administer 2 sprays in each nostril daily. 48 g 0   furosemide  (LASIX ) 40 MG tablet Take 1 tablet (40 mg total) by mouth daily. 30 tablet 0   Milk Thistle 150 MG CAPS Take 150 mg by mouth daily.     olmesartan  (BENICAR ) 40 MG tablet Take 1 tablet (40 mg total) by mouth daily. Stop lisinopril . 30 tablet 1   OVER THE COUNTER MEDICATION Take 2 capsules by mouth daily. FISH OIL OMEGA 3 VIT C VIT E 2000-650-12MG      pantoprazole  (PROTONIX ) 40 MG tablet Take 1 tablet (40 mg total) by mouth daily. 90 tablet 0   potassium chloride  SA (KLOR-CON  M) 20 MEQ tablet Take 1 tablet (20 mEq total) by mouth daily. 30 tablet 1   potassium chloride  SA (KLOR-CON  M) 20 MEQ tablet Take 2 tablets (40 mEq total) by mouth daily. 180 tablet 1   pravastatin  (PRAVACHOL ) 20 MG tablet TAKE 1 TABLET BY MOUTH EVERY EVENING  (Patient taking differently: Take 20 mg by mouth every evening.) 90 tablet 2   pravastatin  (PRAVACHOL ) 20 MG tablet Take 1 tablet (20 mg total) by mouth nightly. 90 tablet 0   prednisoLONE  acetate (PRED FORTE ) 1 % ophthalmic suspension Place 1 drop into the right eye 4 (four) times daily. (Patient not taking: Reported on 05/31/2023) 10 mL 0   semaglutide -weight management (WEGOVY ) 0.25 MG/0.5ML SOAJ SQ injection Inject 0.25 mg into the skin every 7 (seven) days. 2 mL 0   Semaglutide -Weight Management (WEGOVY ) 0.25 MG/0.5ML SOAJ Inject 0.25 mg into the skin every 7 (seven) days. 2 mL 0   Semaglutide -Weight Management (WEGOVY ) 0.25 MG/0.5ML SOAJ Inject 0.25 mg into the skin every 7 days 2 mL 0   Spacer/Aero-Holding Chambers DEVI Use as directed 1 each 1   traMADol  (ULTRAM ) 50 MG tablet Take 1 tablet (50 mg total) by mouth every 6 (six) hours as needed for moderate pain (4-6). 120 tablet 0   traZODone  (DESYREL ) 50 MG tablet Take 1 tablet (50 mg total) by mouth at bedtime. *stop zolpidem * (Patient taking differently: Take 50 mg by mouth at bedtime as needed for sleep.) 30 tablet 3   VEVYE 0.1 % SOLN Place 1 drop into both eyes 2 (two) times daily.     zolpidem  (AMBIEN  CR) 6.25 MG CR tablet Take 1 tablet (6.25 mg total) by mouth at bedtime as needed for sleep 30 tablet 0   No current facility-administered medications for this visit.    Medication reconciliation is based on the patient's most recent medication list in the electronic medical record (EMR) including herbal products and OTC medications.   The patient's medication list was reviewed today with the patient? Yes   Drug-drug interactions (DDIs) DDIs were evaluated? Yes Significant DDIs identified? No but she  is on several duplicate prescriptions which she has due to needing refills. We removed several medications she stated she no longer is taking  Drug-Food Interactions Drug-food interactions were evaluated? Yes Drug-food interactions  identified? Avoid grapefruit products  Follow-up Plan  Treatment start date: 12/13/23 Port placement date: 12/06/23 ECHO date: 12/05/23 Prescriptions released to local pharmacy of choice We reviewed the prescriptions, premedications, and treatment regimen with the patient. Possible side effects of the treatment regimen were reviewed and management strategies were discussed. Can use over-the-counter (OTC) options of loperamide (Imodium) as needed for diarrhea, loratadine (Claritin) as needed for G-CSF bone pain, and docusate + senna (Senna-S) as needed for constipation.  She sees Santana Molt for her hypertension management monthly. She is asymptomatic today in clinic but her blood pressure was elevated. She states it is normal at home Clinical pharmacy will assist Dr. Praveena Iruku and Sivan O Hutsell on an as needed basis going forward  Amesha MALVA Gearing participated in the discussion, expressed understanding, and voiced agreement with the above plan. All questions were answered to her satisfaction. The patient was advised to contact the clinic at [phone] with any questions or concerns prior to her return visit.   I spent 60 minutes assessing the patient.  Nachelle Negrette A. Lucila, PharmD, BCOP, CPP  Norleen DELENA Lucila, RPH-CPP, 12/05/2023 2:03 PM  **Disclaimer: This note was dictated with voice recognition software. Similar sounding words can inadvertently be transcribed and this note may contain transcription errors which may not have been corrected upon publication of note.**

## 2023-12-05 NOTE — Progress Notes (Signed)
 Pharmacist Chemotherapy Monitoring - Initial Assessment    Anticipated start date: 12/13/23   The following has been reviewed per standard work regarding the patient's treatment regimen: The patient's diagnosis, treatment plan and drug doses, and organ/hematologic function Lab orders and baseline tests specific to treatment regimen  The treatment plan start date, drug sequencing, and pre-medications Prior authorization status  Patient's documented medication list, including drug-drug interaction screen and prescriptions for anti-emetics and supportive care specific to the treatment regimen The drug concentrations, fluid compatibility, administration routes, and timing of the medications to be used The patient's access for treatment and lifetime cumulative dose history, if applicable  The patient's medication allergies and previous infusion related reactions, if applicable   Changes made to treatment plan:  The following biosimilar Kanjinti (trastuzumab-anns) has been selected for use in this patient.   Follow up needed:  Pending authorization for treatment  Need home antiemetics   Ashley Warren, Pharm.D., CPP 12/05/2023@11 :26 AM

## 2023-12-05 NOTE — H&P (Signed)
 Chief Complaint: Left breast cancer; referred for Port-A-Cath placement to assist with treatment  Referring Provider(s): Iruku,P  Supervising Physician: Jennefer Rover  Patient Status: [facility] - Out-pt  History of Present Illness: Ashley Warren is a 68 y.o. female with past medical history significant for hypertension, CHF with diastolic dysfunction who presents now with newly diagnosed left breast cancer.  She is scheduled today for Port-A-Cath placement to assist with treatment.  *** Patient is Full Code  Past Medical History:  Diagnosis Date   Breast cancer (HCC)    Hypertension     Past Surgical History:  Procedure Laterality Date   ABDOMINAL HYSTERECTOMY     APPENDECTOMY     COSMETIC SURGERY     mass removed from shoulder/scapula area    Allergies: Penicillins  Medications: Prior to Admission medications   Medication Sig Start Date End Date Taking? Authorizing Provider  albuterol  (PROVENTIL ) (2.5 MG/3ML) 0.083% nebulizer solution Inhale 3 mL (2.5 mg total) into the lungs via nebulization every 8 (eight) hours as needed for wheezing or shortness of breath. Patient taking differently: Take 2.5 mg by nebulization every 8 (eight) hours as needed for wheezing or shortness of breath. 03/08/23     albuterol  (VENTOLIN  HFA) 108 (90 Base) MCG/ACT inhaler Inhale 2 puffs every 6 (six) hours as needed for wheezing (cough). Patient taking differently: Inhale 2 puffs into the lungs in the morning and at bedtime. 02/08/23     albuterol  (VENTOLIN  HFA) 108 (90 Base) MCG/ACT inhaler Inhale 2 puffs into the lungs every 6 (six) hours as needed. 03/08/23     albuterol  (VENTOLIN  HFA) 108 (90 Base) MCG/ACT inhaler Inhale 2 puffs into the lungs every 6 (six) hours as needed for wheezing (cough). 06/15/23     amLODipine  (NORVASC ) 10 MG tablet Take 1 tablet (10 mg total) by mouth daily. 10/30/23     Ascorbic Acid  (VITAMIN C CR) 500 MG CPCR Take 500 mg by mouth daily.    [provider]   B Complex Vitamins (B COMPLEX PO) Take 1 tablet by mouth daily.    [provider]  BACILLUS COAGULANS-INULIN PO Take 1 tablet by mouth daily.    [provider]  Black Cohosh 160 MG CAPS Take 160 mg by mouth daily.    [provider]  Boswellia Serrata (BOSWELLIA PO) Take 375 mg by mouth daily.    [provider]  budesonide -formoterol  (SYMBICORT ) 160-4.5 MCG/ACT inhaler Inhale 2 puffs into the lungs in the morning and at bedtime. 03/27/23     budesonide -glycopyrrolate -formoterol  (BREZTRI  AEROSPHERE) 160-9-4.8 MCG/ACT AERO inhaler Inhale 2 puffs into the lungs 2 (two) times daily. 09/15/23     carvedilol  (COREG ) 12.5 MG tablet Take 1 tablet (12.5 mg total) by mouth 2 (two) times daily in the morning and evening with a meal. 07/07/23     diclofenac  Sodium (VOLTAREN ) 1 % GEL APPLY 4 GRAMS 3 TIMES DAILY TO AFFECTED AREA(S) Patient taking differently: Apply 1 Application topically in the morning and at bedtime. 01/11/22     diclofenac  Sodium (VOLTAREN ) 1 % GEL Apply 1 g topically 3 (three) times a day. 11/03/23     ergocalciferol (VITAMIN D2) 1.25 MG (50000 UT) capsule Take 50,000 Units by mouth once a week.    [provider]  fluticasone  (FLONASE ) 50 MCG/ACT nasal spray Administer 2 sprays in each nostril daily. 11/27/23     furosemide  (LASIX ) 40 MG tablet Take 1 tablet (40 mg total) by mouth daily. 10/30/23  Milk Thistle 150 MG CAPS Take 150 mg by mouth daily.    [provider]  olmesartan  (BENICAR ) 40 MG tablet Take 1 tablet (40 mg total) by mouth daily. Stop lisinopril . 12/04/23     OVER THE COUNTER MEDICATION Take 2 capsules by mouth daily. FISH OIL OMEGA 3 VIT C VIT E 2000-650-12MG     [provider]  pantoprazole  (PROTONIX ) 40 MG tablet Take 1 tablet (40 mg total) by mouth daily. 10/23/23     potassium chloride  SA (KLOR-CON  M) 20 MEQ tablet Take 1 tablet (20 mEq total) by mouth daily. 06/02/23   Fairy Frames, MD  potassium  chloride SA (KLOR-CON  M) 20 MEQ tablet Take 2 tablets (40 mEq total) by mouth daily. 06/02/23     pravastatin  (PRAVACHOL ) 20 MG tablet TAKE 1 TABLET BY MOUTH EVERY EVENING Patient taking differently: Take 20 mg by mouth every evening. 01/16/20 05/31/23  Joshua Santana CROME, NP  pravastatin  (PRAVACHOL ) 20 MG tablet Take 1 tablet (20 mg total) by mouth nightly. 10/09/23     prednisoLONE  acetate (PRED FORTE ) 1 % ophthalmic suspension Place 1 drop into the right eye 4 (four) times daily. Patient not taking: Reported on 05/31/2023 03/07/23     semaglutide -weight management (WEGOVY ) 0.25 MG/0.5ML SOAJ SQ injection Inject 0.25 mg into the skin every 7 (seven) days. 10/30/23     Semaglutide -Weight Management (WEGOVY ) 0.25 MG/0.5ML SOAJ Inject 0.25 mg into the skin every 7 (seven) days. 09/28/22     Semaglutide -Weight Management (WEGOVY ) 0.25 MG/0.5ML SOAJ Inject 0.25 mg into the skin every 7 days 12/05/22     Spacer/Aero-Holding Chambers DEVI Use as directed 09/30/13   Remonia Alm PARAS, MD  traMADol  (ULTRAM ) 50 MG tablet Take 1 tablet (50 mg total) by mouth every 6 (six) hours as needed for moderate pain (4-6). 11/20/23     traZODone  (DESYREL ) 50 MG tablet Take 1 tablet (50 mg total) by mouth at bedtime. *stop zolpidem * Patient taking differently: Take 50 mg by mouth at bedtime as needed for sleep. 03/27/23     VEVYE 0.1 % SOLN Place 1 drop into both eyes 2 (two) times daily.    [provider]  zolpidem  (AMBIEN  CR) 6.25 MG CR tablet Take 1 tablet (6.25 mg total) by mouth at bedtime as needed for sleep 12/15/21     lisinopril  (ZESTRIL ) 20 MG tablet Take 1 tablet (20 mg total) by mouth daily. 06/02/23 11/09/23  Fairy Frames, MD     Family History  Problem Relation Age of Onset   Alcohol  abuse Maternal Grandmother    Autism spectrum disorder Sister    Breast cancer Mother    Depression Sister    Hypertension Mother    Hypertension Sister    Osteoarthritis Maternal Grandmother     Social History    Socioeconomic History   Marital status: Divorced    Spouse name: Not on file   Number of children: Not on file   Years of education: Not on file   Highest education level: Not on file  Occupational History   Not on file  Tobacco Use   Smoking status: Never   Smokeless tobacco: Never  Vaping Use   Vaping status: Never Used  Substance and Sexual Activity   Alcohol  use: Yes    Comment: ocially   Drug use: No   Sexual activity: Not on file  Other Topics Concern   Not on file  Social History Narrative   Not on file   Social Drivers of Health  Financial Resource Strain: Not on file  Food Insecurity: No Food Insecurity (11/23/2023)   Hunger Vital Sign    Worried About Running Out of Food in the Last Year: Never true    Ran Out of Food in the Last Year: Never true  Transportation Needs: No Transportation Needs (11/23/2023)   PRAPARE - Administrator, Civil Service (Medical): No    Lack of Transportation (Non-Medical): No  Physical Activity: Inactive (07/27/2023)   Received from [facility]   Exercise Vital Sign    On average, how many days per week do you engage in moderate to strenuous exercise (like a brisk walk)?: 0 days    On average, how many minutes do you engage in exercise at this level?: 0 min  Stress: Not on file  Social Connections: Unknown (06/01/2023)   Social Connection and Isolation Panel    Frequency of Communication with Friends and Family: More than three times a week    Frequency of Social Gatherings with Friends and Family: Not on file    Attends Religious Services: Not on file    Active Member of Clubs or Organizations: Not on file    Attends Banker Meetings: Not on file    Marital Status: Not on file      Review of Systems  Vital Signs:   Advance Care Plan: no documents on file    Physical Exam  Imaging: No results found.  Labs:  CBC: Recent Labs    05/31/23 0451 05/31/23 1250  06/02/23 0245 11/22/23 1413  WBC 8.8 8.2 7.4 6.5  HGB 13.8 13.9 15.4* 15.3*  HCT 41.9 41.8 46.9* 47.1*  PLT 282 283 320 247    COAGS: No results for input(s): INR, APTT in the last 8760 hours.  BMP: Recent Labs    05/31/23 0451 05/31/23 1250 06/01/23 0931 06/02/23 0245 11/22/23 1413  NA 140  --  137 136 139  K 2.9*  --  3.4* 3.3* 3.7  CL 99  --  99 101 103  CO2 30  --  24 27 32  GLUCOSE 138*  --  113* 120* 112*  BUN 16  --  11 15 11   CALCIUM  9.7  --  10.0 10.1 9.6  CREATININE 0.86 0.71 0.58 0.67 0.60  GFRNONAA >60 >60 >60 >60  --     LIVER FUNCTION TESTS: Recent Labs    11/22/23 1413  BILITOT 0.5  AST 18  ALT 18  ALKPHOS 125  PROT 7.7  ALBUMIN 3.6    TUMOR MARKERS: No results for input(s): AFPTM, CEA, CA199, CHROMGRNA in the last 8760 hours.  Assessment and Plan: 68 y.o. female with past medical history significant for hypertension, CHF with diastolic dysfunction who presents now with newly diagnosed left breast cancer.  She is scheduled today for Port-A-Cath placement to assist with treatment.Risks and benefits of image guided port-a-catheter placement was discussed with the patient including, but not limited to bleeding, infection, pneumothorax, or fibrin sheath development and need for additional procedures.  All of the patient's questions were answered, patient is agreeable to proceed. Consent signed and in chart.  Patient known to IR team from PICC placement in 2005  Thank you for allowing our service to participate in Adanna MALVA Gearing 's care.  Electronically Signed: D. Franky Rakers, PA-C   12/05/2023, 1:20 PM      I spent a total of 20 minutes    in face to face in clinical consultation, greater  than 50% of which was counseling/coordinating care for port a cath placement

## 2023-12-05 NOTE — Progress Notes (Signed)
  Echocardiogram 2D Echocardiogram has been performed.  Ashley Warren, RDCS 12/05/2023, 1:47 PM

## 2023-12-06 ENCOUNTER — Ambulatory Visit (HOSPITAL_COMMUNITY)
Admission: RE | Admit: 2023-12-06 | Discharge: 2023-12-06 | Disposition: A | Discharge status: Home / Self Care | Source: Ambulatory Visit | Attending: Hematology and Oncology | Admitting: Hematology and Oncology

## 2023-12-06 ENCOUNTER — Other Ambulatory Visit: Payer: Self-pay

## 2023-12-06 ENCOUNTER — Encounter (HOSPITAL_COMMUNITY): Payer: Self-pay

## 2023-12-06 DIAGNOSIS — Z79899 Other long term (current) drug therapy: Secondary | ICD-10-CM | POA: Diagnosis not present

## 2023-12-06 DIAGNOSIS — I5032 Chronic diastolic (congestive) heart failure: Secondary | ICD-10-CM | POA: Diagnosis not present

## 2023-12-06 DIAGNOSIS — C50512 Malignant neoplasm of lower-outer quadrant of left female breast: Secondary | ICD-10-CM | POA: Insufficient documentation

## 2023-12-06 DIAGNOSIS — C50919 Malignant neoplasm of unspecified site of unspecified female breast: Secondary | ICD-10-CM | POA: Diagnosis not present

## 2023-12-06 DIAGNOSIS — I110 Hypertensive heart disease with heart failure: Secondary | ICD-10-CM | POA: Diagnosis not present

## 2023-12-06 HISTORY — PX: IR IMAGING GUIDED PORT INSERTION: IMG5740

## 2023-12-06 HISTORY — DX: Unspecified osteoarthritis, unspecified site: M19.90

## 2023-12-06 HISTORY — DX: Heart failure, unspecified: I50.9

## 2023-12-06 MED ORDER — SODIUM CHLORIDE 0.9 % IV SOLN: INTRAVENOUS | Status: DC

## 2023-12-06 MED ORDER — MIDAZOLAM HCL 2 MG/2ML IJ SOLN
INTRAMUSCULAR | Status: AC
Start: 2023-12-06 — End: 2023-12-06
  Filled 2023-12-06: qty 2

## 2023-12-06 MED ORDER — HEPARIN SOD (PORK) LOCK FLUSH 100 UNIT/ML IV SOLN
500.0000 [IU] | Freq: Once | INTRAVENOUS | Status: AC
  Administered 2023-12-06: 500 [IU] via INTRAVENOUS

## 2023-12-06 MED ORDER — LIDOCAINE-EPINEPHRINE 1 %-1:100000 IJ SOLN
INTRAMUSCULAR | Status: AC
  Filled 2023-12-06: qty 1

## 2023-12-06 MED ORDER — HEPARIN SOD (PORK) LOCK FLUSH 100 UNIT/ML IV SOLN
INTRAVENOUS | Status: AC
  Filled 2023-12-06: qty 5

## 2023-12-06 MED ORDER — MIDAZOLAM HCL (PF) 2 MG/2ML IJ SOLN
INTRAMUSCULAR | Status: AC | PRN
Start: 2023-12-06 — End: 2023-12-06
  Administered 2023-12-06 (×2): 1 mg via INTRAVENOUS

## 2023-12-06 MED ORDER — FENTANYL CITRATE (PF) 100 MCG/2ML IJ SOLN
INTRAMUSCULAR | Status: AC | PRN
  Administered 2023-12-06 (×2): 50 ug via INTRAVENOUS

## 2023-12-06 MED ORDER — LIDOCAINE-EPINEPHRINE 1 %-1:100000 IJ SOLN
20.0000 mL | Freq: Once | INTRAMUSCULAR | Status: AC
  Administered 2023-12-06: 20 mL via INTRADERMAL

## 2023-12-06 MED ORDER — FENTANYL CITRATE (PF) 100 MCG/2ML IJ SOLN
INTRAMUSCULAR | Status: AC
  Filled 2023-12-06: qty 2

## 2023-12-06 NOTE — Progress Notes (Signed)
 1315 Ice bag given to use to neck and chest as needed for comfort as instructed.

## 2023-12-06 NOTE — Procedures (Signed)
 Interventional Radiology Procedure Note  Procedure: Single Lumen Power Port Placement    Access:  Right internal jugular vein  Findings: Catheter tip positioned at cavoatrial junction. Port is ready for immediate use.   Complications: None  EBL: < 10 mL  Recommendations:  - Ok to shower in 24 hours - Do not submerge for 7 days - Routine line care    Ester Sides, MD

## 2023-12-06 NOTE — Discharge Instructions (Signed)
Discharge Instructions:   Please call Interventional Radiology clinic [phone] with any questions or concerns.  You may remove your dressing and shower tomorrow.  Do not use EMLA / Lidocaine cream for 2 weeks post Port Insertion this will remove the surgical glue.  Moderate Conscious Sedation, Adult, Care After This sheet gives you information about how to care for yourself after your procedure. Your health care provider may also give you more specific instructions. If you have problems or questions, contact your health care provider. What can I expect after the procedure? After the procedure, it is common to have: Sleepiness for several hours. Impaired judgment for several hours. Difficulty with balance. Vomiting if you eat too soon. Follow these instructions at home: For the time period you were told by your health care provider: Rest. Do not participate in activities where you could fall or become injured. Do not drive or use machinery. Do not drink alcohol. Do not take sleeping pills or medicines that cause drowsiness. Do not make important decisions or sign legal documents. Do not take care of children on your own. Eating and drinking  Follow the diet recommended by your health care provider. Drink enough fluid to keep your urine pale yellow. If you vomit: Drink water, juice, or soup when you can drink without vomiting. Make sure you have little or no nausea before eating solid foods. General instructions Take over-the-counter and prescription medicines only as told by your health care provider. Have a responsible adult stay with you for the time you are told. It is important to have someone help care for you until you are awake and alert. Do not smoke. Keep all follow-up visits as told by your health care provider. This is important. Contact a health care provider if: You are still sleepy or having trouble with balance after 24 hours. You feel light-headed. You keep  feeling nauseous or you keep vomiting. You develop a rash. You have a fever. You have redness or swelling around the IV site. Get help right away if: You have trouble breathing. You have new-onset confusion at home. Summary After the procedure, it is common to feel sleepy, have impaired judgment, or feel nauseous if you eat too soon. Rest after you get home. Know the things you should not do after the procedure. Follow the diet recommended by your health care provider and drink enough fluid to keep your urine pale yellow. Get help right away if you have trouble breathing or new-onset confusion at home. This information is not intended to replace advice given to you by your health care provider. Make sure you discuss any questions you have with your health care provider. Document Revised: 04/26/2019 Document Reviewed: 11/22/2018 Elsevier Patient Education  2023 Elsevier Inc.  Implanted Port Insertion, Care After The following information offers guidance on how to care for yourself after your procedure. Your health care provider may also give you more specific instructions. If you have problems or questions, contact your health care provider. What can I expect after the procedure? After the procedure, it is common to have: Discomfort at the port insertion site. Bruising on the skin over the port. This should improve over 3-4 days. Follow these instructions at home: Port care After your port is placed, you will get a manufacturer's information card. The card has information about your port. Keep this card with you at all times. Take care of the port as told by your health care provider. Ask your health care provider if you or   a family member can get training for taking care of the port at home. A home health care nurse will be be available to help care for the port. Make sure to remember what type of port you have. Incision care     Follow instructions from your health care provider  about how to take care of your port insertion site. Make sure you: Wash your hands with soap and water for at least 20 seconds before and after you change your bandage (dressing). If soap and water are not available, use hand sanitizer. Change your dressing as told by your health care provider. Leave stitches (sutures), skin glue, or adhesive strips in place. These skin closures may need to stay in place for 2 weeks or longer. If adhesive strip edges start to loosen and curl up, you may trim the loose edges. Do not remove adhesive strips completely unless your health care provider tells you to do that. Check your port insertion site every day for signs of infection. Check for: Redness, swelling, or pain. Fluid or blood. Warmth. Pus or a bad smell. Activity Return to your normal activities as told by your health care provider. Ask your health care provider what activities are safe for you. You may have to avoid lifting. Ask your health care provider how much you can safely lift. General instructions Take over-the-counter and prescription medicines only as told by your health care provider. Do not take baths, swim, or use a hot tub until your health care provider approves. Ask your health care provider if you may take showers. You may only be allowed to take sponge baths. If you were given a sedative during the procedure, it can affect you for several hours. Do not drive or operate machinery until your health care provider says that it is safe. Wear a medical alert bracelet in case of an emergency. This will tell any health care providers that you have a port. Keep all follow-up visits. This is important. Contact a health care provider if: You cannot flush your port with saline as directed, or you cannot draw blood from the port. You have a fever or chills. You have redness, swelling, or pain around your port insertion site. You have fluid or blood coming from your port insertion site. Your port  insertion site feels warm to the touch. You have pus or a bad smell coming from the port insertion site. Get help right away if: You have chest pain or shortness of breath. You have bleeding from your port that you cannot control. These symptoms may be an emergency. Get help right away. Call 911. Do not wait to see if the symptoms will go away. Do not drive yourself to the hospital. Summary Take care of the port as told by your health care provider. Keep the manufacturer's information card with you at all times. Change your dressing as told by your health care provider. Contact a health care provider if you have a fever or chills or if you have redness, swelling, or pain around your port insertion site. Keep all follow-up visits. This information is not intended to replace advice given to you by your health care provider. Make sure you discuss any questions you have with your health care provider. Document Revised: 06/30/2020 Document Reviewed: 06/30/2020 Elsevier Patient Education  2023 Elsevier Inc.  

## 2023-12-08 ENCOUNTER — Other Ambulatory Visit (HOSPITAL_COMMUNITY): Payer: Self-pay

## 2023-12-11 ENCOUNTER — Telehealth: Payer: Self-pay | Admitting: *Deleted

## 2023-12-11 MED ORDER — FUROSEMIDE 40 MG PO TABS
40.0000 mg | ORAL_TABLET | Freq: Every day | ORAL | 0 refills | Status: DC
  Filled 2023-12-11 – 2023-12-12 (×2): qty 30, 30d supply, fill #0

## 2023-12-11 NOTE — Telephone Encounter (Signed)
 Pt called requesting information on decadron  administration and injection as well as FMLA paperwork. Left message on pt private vm as she requested with direction on how to take medication as well to make aware that FMLA paperwork was faxed on 11/28 and has been turned in and advised that it takes 7-14 days turn around. Advised to call office for any questions or direction.

## 2023-12-12 ENCOUNTER — Telehealth: Payer: Self-pay | Admitting: Pharmacist

## 2023-12-12 ENCOUNTER — Other Ambulatory Visit (HOSPITAL_COMMUNITY): Payer: Self-pay

## 2023-12-12 NOTE — Telephone Encounter (Signed)
 [facility]        Telephone: [phone]?Fax: [phone]   Oncology Clinical Pharmacist Practitioner Progress Note   Ashley Warren is a 68 y.o. female with a diagnosis of breast cancer currently on TCHP which will be starting tomorrow under the care of Dr. Amber Iruku.   I connected with Mahaley O Lamora today by telephone and verified that I was speaking with the correct person using two patient identifiers. I discussed the limitations, risks, security and privacy concerns of performing an evaluation and management service by telemedicine and the availability of in-person appointments. The patient/caregiver expressed understanding and agreed to proceed.  Other persons participating in the visit and their role in the encounter: none   Patient's location: home  Provider's location: clinic  Ms. Hedgecock contacted clinical pharmacy and had questions about how to take loratadine (Claritin) for minimizing GCSF associated bone pain. We again reviewed this information and discussed that she could use the generic alternative loratadine. She starts chemotherapy tomorrow and will receiving her GCSF injection on Friday.  Philamena O Comas participated in the discussion, expressed understanding, and voiced agreement with the above plan. All questions were answered to her satisfaction. The patient was advised to contact the clinic at [phone] with any questions or concerns prior to her return visit.  Clinical pharmacy will continue to support Tashawna MALVA Gearing and Dr. Praveena Iruku as needed.  Mabelle Mungin A. Lucila, PharmD, BCOP, CPP  Norleen DELENA Lucila, RPH-CPP,  12/12/2023  2:56 PM   **Disclaimer: This note was dictated with voice recognition software. Similar sounding words can inadvertently be transcribed and this note may contain transcription errors which may not have been corrected upon publication of note.**

## 2023-12-13 ENCOUNTER — Inpatient Hospital Stay: Attending: Licensed Clinical Social Worker

## 2023-12-13 ENCOUNTER — Inpatient Hospital Stay

## 2023-12-13 ENCOUNTER — Inpatient Hospital Stay: Admitting: Hematology and Oncology

## 2023-12-13 VITALS — BP 168/91 | HR 90 | Temp 98.2°F | Resp 20

## 2023-12-13 VITALS — BP 177/115 | HR 84 | Temp 98.4°F | Resp 20 | Ht 66.0 in | Wt 344.9 lb

## 2023-12-13 DIAGNOSIS — C50512 Malignant neoplasm of lower-outer quadrant of left female breast: Secondary | ICD-10-CM | POA: Insufficient documentation

## 2023-12-13 DIAGNOSIS — C773 Secondary and unspecified malignant neoplasm of axilla and upper limb lymph nodes: Secondary | ICD-10-CM | POA: Diagnosis not present

## 2023-12-13 DIAGNOSIS — Z171 Estrogen receptor negative status [ER-]: Secondary | ICD-10-CM | POA: Insufficient documentation

## 2023-12-13 DIAGNOSIS — Z5189 Encounter for other specified aftercare: Secondary | ICD-10-CM | POA: Insufficient documentation

## 2023-12-13 DIAGNOSIS — Z5112 Encounter for antineoplastic immunotherapy: Secondary | ICD-10-CM | POA: Diagnosis present

## 2023-12-13 LAB — CBC WITH DIFFERENTIAL (CANCER CENTER ONLY)
Abs Immature Granulocytes: 0.03 K/uL (ref 0.00–0.07)
Basophils Absolute: 0 K/uL (ref 0.0–0.1)
Basophils Relative: 0 %
Eosinophils Absolute: 0 K/uL (ref 0.0–0.5)
Eosinophils Relative: 0 %
HCT: 42.7 % (ref 36.0–46.0)
Hemoglobin: 14.1 g/dL (ref 12.0–15.0)
Immature Granulocytes: 0 %
Lymphocytes Relative: 13 %
Lymphs Abs: 1.1 K/uL (ref 0.7–4.0)
MCH: 25 pg — ABNORMAL LOW (ref 26.0–34.0)
MCHC: 33 g/dL (ref 30.0–36.0)
MCV: 75.8 fL — ABNORMAL LOW (ref 80.0–100.0)
Monocytes Absolute: 0.3 K/uL (ref 0.1–1.0)
Monocytes Relative: 3 %
Neutro Abs: 7.3 K/uL (ref 1.7–7.7)
Neutrophils Relative %: 84 %
Platelet Count: 253 K/uL (ref 150–400)
RBC: 5.63 MIL/uL — ABNORMAL HIGH (ref 3.87–5.11)
RDW: 18 % — ABNORMAL HIGH (ref 11.5–15.5)
WBC Count: 8.8 K/uL (ref 4.0–10.5)
nRBC: 0 % (ref 0.0–0.2)

## 2023-12-13 LAB — CMP (CANCER CENTER ONLY)
ALT: 20 U/L (ref 0–44)
AST: 19 U/L (ref 15–41)
Albumin: 3.8 g/dL (ref 3.5–5.0)
Alkaline Phosphatase: 130 U/L — ABNORMAL HIGH (ref 38–126)
Anion gap: 8 (ref 5–15)
BUN: 16 mg/dL (ref 8–23)
CO2: 30 mmol/L (ref 22–32)
Calcium: 9.9 mg/dL (ref 8.9–10.3)
Chloride: 104 mmol/L (ref 98–111)
Creatinine: 0.67 mg/dL (ref 0.44–1.00)
GFR, Estimated: >60 mL/min (ref >60)
Glucose, Bld: 133 mg/dL — ABNORMAL HIGH (ref 70–99)
Potassium: 3.9 mmol/L (ref 3.5–5.1)
Sodium: 142 mmol/L (ref 135–145)
Total Bilirubin: 0.4 mg/dL (ref 0.0–1.2)
Total Protein: 8 g/dL (ref 6.5–8.1)

## 2023-12-13 MED ORDER — SODIUM CHLORIDE 0.9 % IV SOLN
50.0000 mg/m2 | Freq: Once | INTRAVENOUS | Status: AC
  Administered 2023-12-13: 132 mg via INTRAVENOUS
  Filled 2023-12-13: qty 13.2

## 2023-12-13 MED ORDER — DEXAMETHASONE SOD PHOSPHATE PF 10 MG/ML IJ SOLN
10.0000 mg | Freq: Once | INTRAMUSCULAR | Status: AC
  Administered 2023-12-13: 10 mg via INTRAVENOUS

## 2023-12-13 MED ORDER — ACETAMINOPHEN 325 MG PO TABS
650.0000 mg | ORAL_TABLET | Freq: Once | ORAL | Status: AC
  Administered 2023-12-13: 650 mg via ORAL
  Filled 2023-12-13: qty 2

## 2023-12-13 MED ORDER — APREPITANT 130 MG/18ML IV EMUL
130.0000 mg | Freq: Once | INTRAVENOUS | Status: AC
  Administered 2023-12-13: 130 mg via INTRAVENOUS
  Filled 2023-12-13: qty 18

## 2023-12-13 MED ORDER — SODIUM CHLORIDE 0.9 % IV SOLN: INTRAVENOUS | Status: DC

## 2023-12-13 MED ORDER — PALONOSETRON HCL INJECTION 0.25 MG/5ML
0.2500 mg | Freq: Once | INTRAVENOUS | Status: AC
  Administered 2023-12-13: 0.25 mg via INTRAVENOUS
  Filled 2023-12-13: qty 5

## 2023-12-13 MED ORDER — SODIUM CHLORIDE 0.9 % IV SOLN
600.0000 mg | Freq: Once | INTRAVENOUS | Status: AC
  Administered 2023-12-13: 600 mg via INTRAVENOUS
  Filled 2023-12-13: qty 60

## 2023-12-13 MED ORDER — DIPHENHYDRAMINE HCL 25 MG PO CAPS
50.0000 mg | ORAL_CAPSULE | Freq: Once | ORAL | Status: AC
  Administered 2023-12-13: 50 mg via ORAL
  Filled 2023-12-13: qty 2

## 2023-12-13 MED ORDER — TRASTUZUMAB-ANNS CHEMO 150 MG IV SOLR
8.0000 mg/kg | Freq: Once | INTRAVENOUS | Status: AC
  Administered 2023-12-13: 1260 mg via INTRAVENOUS
  Filled 2023-12-13: qty 60

## 2023-12-13 NOTE — Progress Notes (Signed)
 Pt hypertensive in our infusion suite today with a reported max BP of 189/93. Pt states she took her antihypertensive med at 3 AM d/t her work schedule. B/P rechecked and was a reported 168/91. OK to d/c per MD with instructions to follow up with her PCP to further manage her BP.

## 2023-12-13 NOTE — Progress Notes (Signed)
 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 - Initiated neoadjuvant chemotherapy with TCH regimen at 75% dose. - Baseline ECHO performed, satisfactory - Ok to proceed with treatment as scheduled.  Congestive heart failure with diastolic dysfunction Managed with diuretics and antihypertensives. Cardiac function adequate for chemotherapy. - Continue current heart failure medications. - Monitor cardiac function during chemotherapy.  SOB likely deconditioning Recent CT chest and ECHO with no def cardiac or pulmonary issues No anemia noted Offered 6 min walk test and oxygen  at home, she is not interested in this at this time  RTC as scheduled.     HISTORY OF PRESENTING ILLNESS:  Ashley Warren 68 y.o. female is here because of new diagnosis of breast cancer.  Discussed the use of AI scribe software for clinical note transcription with the patient, who gave verbal consent to proceed.  History of Present Illness  Ashley Warren is a 68 year old female with breast cancer who presents for chemotherapy initiation.  She is starting chemotherapy today. Dexamethasone  affected her sleep, making her feel hyperactive. There have been no changes in the breast mass, and she has a port placed for  chemotherapy administration.  She experiences shortness of breath and a feeling of congestion, particularly when walking or climbing stairs, which has been an issue since an RSV infection in February. She describes the sensation as 'running out of air' and needing to sit after climbing stairs. Despite these symptoms, her oxygen  levels and hemoglobin are normal.  Her blood pressure medication is usually taken at 11 PM, and she confirms regular adherence to her medication regimen, although she did not take any vitamins or medications today. Her recent echocardiogram was performed  and didn't reveal any major cardiac abnormalities.  All other systems were reviewed with the patient and are negative.  MEDICAL HISTORY:  Past Medical History:  Diagnosis Date   Arthritis    Breast cancer (HCC)    CHF (congestive heart failure) (HCC)    Hypertension     SURGICAL HISTORY: Past Surgical History:  Procedure Laterality Date   ABDOMINAL HYSTERECTOMY     APPENDECTOMY     COSMETIC SURGERY     mass removed from shoulder/scapula area   IR IMAGING GUIDED PORT INSERTION  12/06/2023    SOCIAL HISTORY: Social History   Socioeconomic History   Marital status: Divorced    Spouse name: Not on file   Number of children: Not on file   Years of education: Not on file   Highest education level: Not on file  Occupational History   Not on file  Tobacco Use   Smoking status: Never   Smokeless tobacco: Never  Vaping Use   Vaping status: Never Used  Substance and Sexual Activity   Alcohol  use: Yes    Comment: ocially   Drug use: No   Sexual activity: Not on file  Other Topics Concern   Not on file  Social History Narrative   Not on file   Social Drivers of Health   Financial Resource Strain: Not on file  Food Insecurity: No Food Insecurity (11/23/2023)   Hunger Vital Sign    Worried About Running Out of Food in the Last Year: Never true    Ran Out of Food in the Last Year: Never true   Transportation Needs: No Transportation Needs (11/23/2023)   PRAPARE - Administrator, Civil Service (Medical): No    Lack of Transportation (Non-Medical): No  Physical Activity: Inactive (07/27/2023)   Received from [facility]   Exercise Vital Sign    On average, how many days per week do you engage in moderate to strenuous exercise (like a brisk walk)?: 0 days    On average, how many minutes do you engage in exercise at this level?: 0 min  Stress: Not on file  Social Connections: Unknown (06/01/2023)   Social Connection and Isolation Panel    Frequency of Communication with Friends and Family: More than three times a week    Frequency of Social Gatherings with Friends and Family: Not on file    Attends Religious Services: Not on file    Active Member of Clubs or Organizations: Not on file    Attends Banker Meetings: Not on file    Marital Status: Not on file  Intimate Partner Violence: Not At Risk (06/01/2023)   Humiliation, Afraid, Rape, and Kick questionnaire    Fear of Current or Ex-Partner: No    Emotionally Abused: No    Physically Abused: No    Sexually Abused: No    FAMILY HISTORY: Family History  Problem Relation Age of Onset   Alcohol  abuse Maternal Grandmother    Autism spectrum disorder Sister    Breast cancer Mother    Depression Sister    Hypertension Mother    Hypertension Sister    Osteoarthritis Maternal Grandmother     ALLERGIES:  is allergic to penicillins.  MEDICATIONS:  Current Outpatient Medications  Medication Sig Dispense Refill   albuterol  (PROVENTIL ) (2.5 MG/3ML) 0.083% nebulizer solution Inhale 3 mL (2.5 mg total) into the lungs via nebulization every 8 (eight) hours as needed for wheezing or shortness of breath. 180 mL 0   albuterol  (VENTOLIN  HFA) 108 (90 Base) MCG/ACT inhaler Inhale 2 puffs every 6 (six) hours as needed for wheezing (cough). 6.7 g 1   albuterol  (VENTOLIN  HFA) 108 (90 Base) MCG/ACT  inhaler Inhale 2 puffs into the lungs every 6 (six) hours as needed. 6.7 g 1   albuterol  (VENTOLIN  HFA) 108 (90 Base) MCG/ACT inhaler Inhale 2 puffs into the lungs every 6 (six) hours as needed for wheezing (cough). 6.7 g 1   amLODipine  (NORVASC ) 10 MG tablet Take 1 tablet (10 mg total) by mouth daily. 90 tablet 1   Ascorbic Acid  (VITAMIN C CR) 500 MG CPCR Take 500 mg by mouth daily.     BACILLUS COAGULANS-INULIN PO Take 1 tablet by mouth daily.     budesonide -formoterol  (SYMBICORT ) 160-4.5 MCG/ACT inhaler Inhale 2 puffs into the lungs in the morning and at bedtime. 40.8 g 1   budesonide -glycopyrrolate -formoterol  (BREZTRI  AEROSPHERE) 160-9-4.8 MCG/ACT AERO inhaler Inhale 2 puffs into the lungs 2 (two) times daily. 10.7 g 2   carvedilol  (COREG ) 12.5 MG tablet Take 1 tablet (12.5 mg total) by mouth 2 (two) times daily in the morning and evening with a meal. 180 tablet 1   dexamethasone  (DECADRON ) 4 MG tablet  Take 2 tabs by mouth 2 times daily starting day before chemo. Then take 2 tabs daily for 2 days starting day after chemo. Take with food. 30 tablet 1   diclofenac  Sodium (VOLTAREN ) 1 % GEL Apply 1 g topically 3 (three) times a day. 500 g 2   ergocalciferol (VITAMIN D2) 1.25 MG (50000 UT) capsule Take 50,000 Units by mouth once a week.     fluticasone  (FLONASE ) 50 MCG/ACT nasal spray Administer 2 sprays in each nostril daily. 48 g 0   furosemide  (LASIX ) 40 MG tablet Take 1 tablet (40 mg total) by mouth daily. 30 tablet 0   lidocaine -prilocaine  (EMLA ) cream Apply to affected area once as directed. 30 g 3   olmesartan  (BENICAR ) 40 MG tablet Take 1 tablet (40 mg total) by mouth daily. Stop lisinopril . 30 tablet 1   ondansetron  (ZOFRAN ) 8 MG tablet Take 1 tablet (8 mg total) by mouth every 8 (eight) hours as needed for nausea or vomiting. Start on the third day after chemotherapy. 30 tablet 1   OVER THE COUNTER MEDICATION Take 2 capsules by mouth daily. FISH OIL OMEGA 3 VIT C VIT E 2000-650-12MG       pantoprazole  (PROTONIX ) 40 MG tablet Take 1 tablet (40 mg total) by mouth daily. 90 tablet 0   potassium chloride  SA (KLOR-CON  M) 20 MEQ tablet Take 2 tablets (40 mEq total) by mouth daily. 180 tablet 1   pravastatin  (PRAVACHOL ) 20 MG tablet Take 1 tablet (20 mg total) by mouth nightly. 90 tablet 0   prednisoLONE  acetate (PRED FORTE ) 1 % ophthalmic suspension Place 1 drop into the right eye 4 (four) times daily. 10 mL 0   prochlorperazine  (COMPAZINE ) 10 MG tablet Take 1 tablet (10 mg total) by mouth every 6 (six) hours as needed for nausea or vomiting. 30 tablet 1   Spacer/Aero-Holding Chambers DEVI Use as directed 1 each 1   traMADol  (ULTRAM ) 50 MG tablet Take 1 tablet (50 mg total) by mouth every 6 (six) hours as needed for moderate pain (4-6). 120 tablet 0   VEVYE 0.1 % SOLN Place 1 drop into both eyes 2 (two) times daily.     zolpidem  (AMBIEN  CR) 6.25 MG CR tablet Take 1 tablet (6.25 mg total) by mouth at bedtime as needed for sleep 30 tablet 0   No current facility-administered medications for this visit.   Facility-Administered Medications Ordered in Other Visits  Medication Dose Route Frequency Provider Last Rate Last Admin   0.9 %  sodium chloride  infusion   Intravenous Continuous Haruye Lainez, MD 10 mL/hr at 12/13/23 0910 New Bag at 12/13/23 0910   acetaminophen  (TYLENOL ) tablet 650 mg  650 mg Oral Once Albert Devaul, MD       aprepitant (CINVANTI) injection 130 mg  130 mg Intravenous Once Callan Norden, MD       CARBOplatin (PARAPLATIN) 600 mg in sodium chloride  0.9 % 250 mL chemo infusion  600 mg Intravenous Once Dulcy Sida, MD       dexamethasone  (DECADRON ) injection 10 mg  10 mg Intravenous Once Terez Montee, MD       diphenhydrAMINE (BENADRYL) capsule 50 mg  50 mg Oral Once Jazma Pickel, MD       DOCEtaxel (TAXOTERE) 132 mg in sodium chloride  0.9 % 250 mL chemo infusion  50 mg/m2 (Treatment Plan Recorded) Intravenous Once Ezra Denne, MD       palonosetron  (ALOXI) injection 0.25 mg  0.25 mg Intravenous Once Denia Mcvicar, MD  trastuzumab-anns (KANJINTI) 1,260 mg in sodium chloride  0.9 % 250 mL chemo infusion  8 mg/kg (Treatment Plan Recorded) Intravenous Once Armin Yerger, MD         PHYSICAL EXAMINATION: ECOG PERFORMANCE STATUS: 0 - Asymptomatic  BP (!) 177/115   Pulse 84   Temp 98.4 F (36.9 C) (Temporal)   Resp 20   Ht 5' 6 (1.676 m)   Wt (!) 344 lb 14.4 oz (156.4 kg)   SpO2 100%   BMI 55.67 kg/m    GENERAL:alert, no distress and comfortable Palpable left breast mass at 4 o clock position about 15 cms from nipple measuring about 2.5 cms Left axillary adenopathy not palpable   LABORATORY DATA:  I have reviewed the data as listed Lab Results  Component Value Date   WBC 8.8 12/13/2023   HGB 14.1 12/13/2023   HCT 42.7 12/13/2023   MCV 75.8 (L) 12/13/2023   PLT 253 12/13/2023     Chemistry      Component Value Date/Time   NA 142 12/13/2023 0816   K 3.9 12/13/2023 0816   CL 104 12/13/2023 0816   CO2 30 12/13/2023 0816   BUN 16 12/13/2023 0816   CREATININE 0.67 12/13/2023 0816      Component Value Date/Time   CALCIUM  9.9 12/13/2023 0816   ALKPHOS 130 (H) 12/13/2023 0816   AST 19 12/13/2023 0816   ALT 20 12/13/2023 0816   BILITOT 0.4 12/13/2023 0816       RADIOGRAPHIC STUDIES: I have personally reviewed the radiological images as listed and agreed with the findings in the report. CT CHEST WO CONTRAST Result Date: 12/07/2023 EXAM: CT CHEST WITHOUT CONTRAST 12/02/2023 01:32:26 PM TECHNIQUE: CT of the chest was performed without the administration of intravenous contrast. Multiplanar reformatted images are provided for review. Automated exposure control, iterative reconstruction, and/or weight based adjustment of the mA/kV was utilized to reduce the radiation dose to as low as reasonably achievable. COMPARISON: None available. CLINICAL HISTORY: Shortness of breath with exertion. Recent breast cancer  diagnosis. FINDINGS: MEDIASTINUM: Heart and pericardium are unremarkable. Scattered coronary artery and aortic atherosclerosis. The central airways are clear. LYMPH NODES: Several small and borderline left axillary lymph nodes measuring up to 10 mm in short axis diameter. No mediastinal or hilar lymphadenopathy. LUNGS AND PLEURA: No focal consolidation or pulmonary edema. No pleural effusion or pneumothorax. SOFT TISSUES/BONES: Left breast nodule measures 2.4 x 2.0 cm on image 15 with central calcification. No acute abnormality of the bones. UPPER ABDOMEN: Limited images of the upper abdomen demonstrates no acute abnormality. IMPRESSION: 1. No acute cardiopulmonary disease. 2. Coronary artery disease, aortic atherosclerosis. 3. Left breast mass measuring 2.4 x 2.0 cm with central calcification, possibly known breast cancer. Recommend clinical correlation and correlation with mammography. Electronically signed by: Franky Crease MD 12/07/2023 09:47 PM EST RP Workstation: [id number]   IR IMAGING GUIDED PORT INSERTION Result Date: 12/06/2023 INDICATION: 68 year old female with history of breast cancer requiring central venous access for chemotherapy administration. EXAM: IMPLANTED PORT A CATH PLACEMENT WITH ULTRASOUND AND FLUOROSCOPIC GUIDANCE COMPARISON:  None Available. MEDICATIONS: None. ANESTHESIA/SEDATION: Moderate (conscious) sedation was employed during this procedure. A total of Versed  2 mg and Fentanyl  100 mcg was administered intravenously. Moderate Sedation Time: 17 minutes. The patient's level of consciousness and vital signs were monitored continuously by radiology nursing throughout the procedure under my direct supervision. CONTRAST:  None FLUOROSCOPY TIME:  4.8 mGy reference air kerma COMPLICATIONS: None immediate. PROCEDURE: The procedure, risks, benefits, and alternatives were explained  to the patient. Questions regarding the procedure were encouraged and answered. The patient understands and  consents to the procedure. The right neck and chest were prepped with chlorhexidine in a sterile fashion, and a sterile drape was applied covering the operative field. Maximum barrier sterile technique with sterile gowns and gloves were used for the procedure. A timeout was performed prior to the initiation of the procedure. Ultrasound was used to examine the jugular vein which was compressible and free of internal echoes. A skin marker was used to demarcate the planned venotomy and port pocket incision sites. Local anesthesia was provided to these sites and the subcutaneous tunnel track with 1% lidocaine  with 1:100,000 epinephrine . A small incision was created at the jugular access site and blunt dissection was performed of the subcutaneous tissues. Under ultrasound guidance, the jugular vein was accessed with a 21 ga micropuncture needle and an 0.018 wire was inserted to the superior vena cava. Real-time ultrasound guidance was utilized for vascular access including the acquisition of a permanent ultrasound image documenting patency of the accessed vessel. A 5 Fr micopuncture set was then used, through which a 0.035 Rosen wire was passed under fluoroscopic guidance into the inferior vena cava. An 8 Fr dilator was then placed over the wire. A subcutaneous port pocket was then created along the upper chest wall utilizing a combination of sharp and blunt dissection. The pocket was irrigated with sterile saline, packed with gauze, and observed for hemorrhage. A single lumen clear view power injectable port was chosen for placement. The 8 Fr catheter was tunneled from the port pocket site to the venotomy incision. The port was placed in the pocket. The external catheter was trimmed to appropriate length. The dilator was exchanged for an 8 Fr peel-away sheath under fluoroscopic guidance. The catheter was then placed through the sheath and the sheath was removed. Final catheter positioning was confirmed and documented  with a fluoroscopic spot radiograph. The port was accessed with a Huber needle, aspirated, and flushed with heparinized saline. The deep dermal layer of the port pocket incision was closed with interrupted 3-0 Vicryl suture. The skin was opposed with a running subcuticular 4-0 Monocryl suture. Dermabond was then placed over the port pocket and neck incisions. The patient tolerated the procedure well without immediate post procedural complication. FINDINGS: After catheter placement, the tip lies within the superior cavoatrial junction. The catheter aspirates and flushes normally and is ready for immediate use. IMPRESSION: Successful placement of a power injectable Port-A-Cath via the right internal jugular vein. The catheter is ready for immediate use. Ester Sides, MD [facility] Electronically Signed   By: Ester Sides M.D.   On: 12/06/2023 13:45   ECHOCARDIOGRAM COMPLETE Result Date: 12/05/2023    ECHOCARDIOGRAM REPORT   Patient Name:   Ashley Warren Date of Exam: 12/05/2023 Medical Rec #:  [id number]           Height:       62.0 in Accession #:    [id number]          Weight:       348.9 lb Date of Birth:  1955-05-21           BSA:          2.422 m Patient Age:    68 years            BP:           187/96 mmHg Patient Gender: F  HR:           58 bpm. Exam Location:  Outpatient Procedure: 2D Echo, 3D Echo, Cardiac Doppler, Color Doppler and Strain Analysis            (Both Spectral and Color Flow Doppler were utilized during            procedure). Indications:    Chemo Z09  History:        Patient has prior history of Echocardiogram examinations.                 Cancer; Risk Factors:Hypertension.  Sonographer:    Koleen Popper RDCS Referring Phys: [id number] Arlis Yale  Sonographer Comments: Patient is obese. Image acquisition challenging due to patient body habitus. Global longitudinal strain was attempted. IMPRESSIONS  1. Left  ventricular ejection fraction, by estimation, is 65 to 70%. The left ventricle has normal function. The left ventricle has no regional wall motion abnormalities. There is moderate left ventricular hypertrophy. Left ventricular diastolic parameters were normal. The average left ventricular global longitudinal strain is -17.3 %. The global longitudinal strain is grossly normal.  2. Right ventricular systolic function is normal. The right ventricular size is normal. Tricuspid regurgitation signal is inadequate for assessing PA pressure.  3. The mitral valve is grossly normal. Trivial mitral valve regurgitation. No evidence of mitral stenosis.  4. The aortic valve was not well visualized. Aortic valve regurgitation is not visualized. No aortic stenosis is present.  5. The inferior vena cava is dilated in size with >50% respiratory variability, suggesting right atrial pressure of 8 mmHg. FINDINGS  Left Ventricle: Left ventricular ejection fraction, by estimation, is 65 to 70%. The left ventricle has normal function. The left ventricle has no regional wall motion abnormalities. The average left ventricular global longitudinal strain is -17.3 %. Strain was performed and the global longitudinal strain is normal. 3D ejection fraction reviewed and evaluated as part of the interpretation. Alternate measurement of EF is felt to be most reflective of LV function. The left ventricular internal cavity size was normal in size. There is moderate left ventricular hypertrophy. Left ventricular diastolic parameters were normal. Right Ventricle: The right ventricular size is normal. No increase in right ventricular wall thickness. Right ventricular systolic function is normal. Tricuspid regurgitation signal is inadequate for assessing PA pressure. Left Atrium: Left atrial size was normal in size. Right Atrium: Right atrial size was normal in size. Pericardium: There is no evidence of pericardial effusion. Mitral Valve: The mitral valve  is grossly normal. Trivial mitral valve regurgitation. No evidence of mitral valve stenosis. Tricuspid Valve: The tricuspid valve is normal in structure. Tricuspid valve regurgitation is trivial. No evidence of tricuspid stenosis. Aortic Valve: The aortic valve was not well visualized. Aortic valve regurgitation is not visualized. No aortic stenosis is present. Pulmonic Valve: The pulmonic valve was normal in structure. Pulmonic valve regurgitation is not visualized. No evidence of pulmonic stenosis. Aorta: The aortic root is normal in size and structure. Venous: The inferior vena cava is dilated in size with greater than 50% respiratory variability, suggesting right atrial pressure of 8 mmHg. IAS/Shunts: The interatrial septum was not well visualized. Additional Comments: 3D was performed not requiring image post processing on an independent workstation and was indeterminate.  LEFT VENTRICLE PLAX 2D LVIDd:         4.50 cm   Diastology LVIDs:         2.50 cm   LV e' medial:    7.07 cm/s LV  PW:         1.40 cm   LV E/e' medial:  13.6 LV IVS:        1.40 cm   LV e' lateral:   9.03 cm/s LVOT diam:     1.60 cm   LV E/e' lateral: 10.6 LV SV:         69 LV SV Index:   28        2D Longitudinal Strain LVOT Area:     2.01 cm  2D Strain GLS Avg:     -17.3 %                           3D Volume EF:                          3D EF:        64 %                          LV EDV:       159 ml                          LV ESV:       57 ml                          LV SV:        102 ml RIGHT VENTRICLE             IVC RV S prime:     10.90 cm/s  IVC diam: 3.60 cm TAPSE (M-mode): 2.0 cm LEFT ATRIUM             Index LA diam:        3.70 cm 1.53 cm/m LA Vol (A2C):   44.9 ml 18.54 ml/m LA Vol (A4C):   47.0 ml 19.41 ml/m LA Biplane Vol: 49.7 ml 20.52 ml/m  AORTIC VALVE LVOT Vmax:   141.00 cm/s LVOT Vmean:  95.500 cm/s LVOT VTI:    0.342 m  AORTA Ao Root diam: 2.70 cm Ao Asc diam:  3.60 cm MITRAL VALVE MV Area (PHT): 3.03 cm    SHUNTS  MV Decel Time: 250 msec    Systemic VTI:  0.34 m MV E velocity: 96.10 cm/s  Systemic Diam: 1.60 cm MV A velocity: 99.80 cm/s MV E/A ratio:  0.96 Soyla Merck MD Electronically signed by Soyla Merck MD Signature Date/Time: 12/05/2023/3:13:56 PM    Final     All questions were answered. The patient knows to call the clinic with any problems, questions or concerns. I spent 30 minutes in the care of this patient including H and P, review of records, counseling and coordination of care.     Amber Stalls, MD 12/13/2023 9:17 AM

## 2023-12-13 NOTE — Patient Instructions (Signed)
 [facility] A DEPT OF [facility]  Discharge Instructions: Thank you for choosing [facility] to provide your oncology and hematology care.   If you have a lab appointment with the Cancer Center, please go directly to the Cancer Center and check in at the registration area.   Wear comfortable clothing and clothing appropriate for easy access to any Portacath or PICC line.   We strive to give you quality time with your provider. You may need to reschedule your appointment if you arrive late (15 or more minutes).  Arriving late affects you and other patients whose appointments are after yours.  Also, if you miss three or more appointments without notifying the office, you may be dismissed from the clinic at the provider's discretion.      For prescription refill requests, have your pharmacy contact our office and allow 72 hours for refills to be completed.    Today you received the following chemotherapy and/or immunotherapy agents Kanjinti, Taxotere, Carboplatin.      To help prevent nausea and vomiting after your treatment, we encourage you to take your nausea medication as directed.  BELOW ARE SYMPTOMS THAT SHOULD BE REPORTED IMMEDIATELY: *FEVER GREATER THAN 100.4 F (38 C) OR HIGHER *CHILLS OR SWEATING *NAUSEA AND VOMITING THAT IS NOT CONTROLLED WITH YOUR NAUSEA MEDICATION *UNUSUAL SHORTNESS OF BREATH *UNUSUAL BRUISING OR BLEEDING *URINARY PROBLEMS (pain or burning when urinating, or frequent urination) *BOWEL PROBLEMS (unusual diarrhea, constipation, pain near the anus) TENDERNESS IN MOUTH AND THROAT WITH OR WITHOUT PRESENCE OF ULCERS (sore throat, sores in mouth, or a toothache) UNUSUAL RASH, SWELLING OR PAIN  UNUSUAL VAGINAL DISCHARGE OR ITCHING   Items with * indicate a potential emergency and should be followed up as soon as possible or go to the Emergency Department if any problems should occur.  Please show the CHEMOTHERAPY ALERT  CARD or IMMUNOTHERAPY ALERT CARD at check-in to the Emergency Department and triage nurse.  Should you have questions after your visit or need to cancel or reschedule your appointment, please contact [facility] - A DEPT OF [facility]  Dept: [phone]  and follow the prompts.  Office hours are 8:00 a.m. to 4:30 p.m. Monday - Friday. Please note that voicemails left after 4:00 p.m. may not be returned until the following business day.  We are closed weekends and major holidays. You have access to a nurse at all times for urgent questions. Please call the main number to the clinic Dept: [phone] and follow the prompts.   For any non-urgent questions, you may also contact your provider using MyChart. We now offer e-Visits for anyone 63 and older to request care online for non-urgent symptoms. For details visit [email or web address].   Also download the MyChart app! Go to the app store, search "MyChart", open the app, select [place], and log in with your MyChart username and password.

## 2023-12-15 ENCOUNTER — Inpatient Hospital Stay

## 2023-12-15 ENCOUNTER — Telehealth: Payer: Self-pay | Admitting: *Deleted

## 2023-12-15 ENCOUNTER — Other Ambulatory Visit (HOSPITAL_COMMUNITY): Payer: Self-pay

## 2023-12-15 VITALS — BP 192/95 | HR 70 | Temp 98.8°F | Resp 18

## 2023-12-15 DIAGNOSIS — Z5112 Encounter for antineoplastic immunotherapy: Secondary | ICD-10-CM | POA: Diagnosis not present

## 2023-12-15 DIAGNOSIS — C50512 Malignant neoplasm of lower-outer quadrant of left female breast: Secondary | ICD-10-CM

## 2023-12-15 MED ORDER — OLMESARTAN MEDOXOMIL 40 MG PO TABS
40.0000 mg | ORAL_TABLET | Freq: Every day | ORAL | 1 refills | Status: AC
  Filled 2024-02-01: qty 90, 90d supply, fill #0

## 2023-12-15 MED ORDER — FUROSEMIDE 40 MG PO TABS
40.0000 mg | ORAL_TABLET | Freq: Every day | ORAL | 1 refills | Status: AC
  Filled 2024-01-05: qty 90, 90d supply, fill #0

## 2023-12-15 MED ORDER — PEGFILGRASTIM-FPGK 6 MG/0.6ML ~~LOC~~ SOSY
6.0000 mg | PREFILLED_SYRINGE | Freq: Once | SUBCUTANEOUS | Status: AC
  Administered 2023-12-15: 6 mg via SUBCUTANEOUS
  Filled 2023-12-15: qty 0.6

## 2023-12-15 NOTE — Telephone Encounter (Signed)
 Per Dr. Loretha, called pt and left vm on pt personal cell to make aware that providers do not recommend doing Pre Neoadjuvant CEM in clinic setting due to hx difficulty breathing/CHF. Was recommended to monitor tumor with mammogram and US . Advised to call office if there are any concerns.

## 2023-12-18 ENCOUNTER — Telehealth: Payer: Self-pay

## 2023-12-18 NOTE — Telephone Encounter (Signed)
 LVM for pt regarding her FMLA/disability forms ae completed,faxed, and confirmation received. Left call back number.

## 2023-12-21 ENCOUNTER — Other Ambulatory Visit: Payer: Self-pay | Admitting: Hematology and Oncology

## 2023-12-21 DIAGNOSIS — C50512 Malignant neoplasm of lower-outer quadrant of left female breast: Secondary | ICD-10-CM

## 2023-12-27 ENCOUNTER — Other Ambulatory Visit: Payer: Self-pay

## 2023-12-27 ENCOUNTER — Other Ambulatory Visit (HOSPITAL_COMMUNITY): Payer: Self-pay

## 2023-12-27 ENCOUNTER — Telehealth: Payer: Self-pay

## 2023-12-27 ENCOUNTER — Encounter: Payer: Self-pay | Admitting: *Deleted

## 2023-12-27 NOTE — Telephone Encounter (Signed)
 LVM for pt regarding her Disability forma being  completed,faxed,and confirmation received. Left call back number.

## 2023-12-31 NOTE — Progress Notes (Unsigned)
 "     [facility]   [phone] [phone]    Patient Care Team: Joshua Santana CROME, NP as PCP - General (Nurse Practitioner) Tyree Nanetta SAILOR, RN as Oncology Nurse Navigator Vernetta Berg, MD as Consulting Physician (General Surgery) Loretha Ash, MD as Consulting Physician (Hematology and Oncology) Shannon Agent, MD as Consulting Physician (Radiation Oncology)   CHIEF COMPLAINT: Follow up HER2 + breast cancer   CURRENT THERAPY: Neoadjuvant TCH with 25% dose reduction; starting 12/13/23  INTERVAL HISTORY Ms. Haran returns for follow up as scheduled. Completed cycle 1 TCH 12/3.   ROS   Past Medical History:  Diagnosis Date   Arthritis    Breast cancer (HCC)    CHF (congestive heart failure) (HCC)    Hypertension      Past Surgical History:  Procedure Laterality Date   ABDOMINAL HYSTERECTOMY     APPENDECTOMY     COSMETIC SURGERY     mass removed from shoulder/scapula area   IR IMAGING GUIDED PORT INSERTION  12/06/2023     Outpatient Encounter Medications as of 01/02/2024  Medication Sig Note   albuterol  (PROVENTIL ) (2.5 MG/3ML) 0.083% nebulizer solution Inhale 3 mL (2.5 mg total) into the lungs via nebulization every 8 (eight) hours as needed for wheezing or shortness of breath.    albuterol  (VENTOLIN  HFA) 108 (90 Base) MCG/ACT inhaler Inhale 2 puffs every 6 (six) hours as needed for wheezing (cough). 05/31/2023: Stated she is using this medication BID.,   albuterol  (VENTOLIN  HFA) 108 (90 Base) MCG/ACT inhaler Inhale 2 puffs into the lungs every 6 (six) hours as needed. 05/31/2023: Unable to remove duplicate entry without DC refills.    albuterol  (VENTOLIN  HFA) 108 (90 Base) MCG/ACT inhaler Inhale 2 puffs into the lungs every 6 (six) hours as needed for wheezing (cough).    amLODipine  (NORVASC ) 10 MG tablet Take 1 tablet (10 mg total) by mouth daily.    Ascorbic Acid  (VITAMIN C CR) 500 MG CPCR Take 500 mg by mouth daily.    BACILLUS  COAGULANS-INULIN PO Take 1 tablet by mouth daily. 12/06/2023: Hasn't started   budesonide -formoterol  (SYMBICORT ) 160-4.5 MCG/ACT inhaler Inhale 2 puffs into the lungs in the morning and at bedtime. 12/06/2023: Not using causes her to wheeze, her doctor is aware   budesonide -glycopyrrolate -formoterol  (BREZTRI  AEROSPHERE) 160-9-4.8 MCG/ACT AERO inhaler Inhale 2 puffs into the lungs 2 (two) times daily.    carvedilol  (COREG ) 12.5 MG tablet Take 1 tablet (12.5 mg total) by mouth 2 (two) times daily in the morning and evening with a meal.    dexamethasone  (DECADRON ) 4 MG tablet Take 2 tabs by mouth 2 times daily starting day before chemo. Then take 2 tabs daily for 2 days starting day after chemo. Take with food. 12/06/2023: Hasn't started   diclofenac  Sodium (VOLTAREN ) 1 % GEL Apply 1 g topically 3 (three) times a day.    ergocalciferol (VITAMIN D2) 1.25 MG (50000 UT) capsule Take 50,000 Units by mouth once a week.    fluticasone  (FLONASE ) 50 MCG/ACT nasal spray Administer 2 sprays in each nostril daily. 12/06/2023: Hasn't started   furosemide  (LASIX ) 40 MG tablet Take 1 tablet (40 mg total) by mouth daily.    lidocaine -prilocaine  (EMLA ) cream Apply to affected area once as directed. 12/06/2023: Hasn't started   olmesartan  (BENICAR ) 40 MG tablet Take 1 tablet (40 mg total) by mouth daily. Stop lisinopril .    olmesartan  (BENICAR ) 40 MG tablet Take 1 tablet (40 mg total) by mouth daily.  ondansetron  (ZOFRAN ) 8 MG tablet Take 1 tablet (8 mg total) by mouth every 8 (eight) hours as needed for nausea or vomiting. Start on the third day after chemotherapy. 12/06/2023: Hasn't started   OVER THE COUNTER MEDICATION Take 2 capsules by mouth daily. FISH OIL OMEGA 3 VIT C VIT E 2000-650-12MG     pantoprazole  (PROTONIX ) 40 MG tablet Take 1 tablet (40 mg total) by mouth daily.    potassium chloride  SA (KLOR-CON  M) 20 MEQ tablet Take 2 tablets (40 mEq total) by mouth daily.    pravastatin  (PRAVACHOL ) 20 MG tablet Take  1 tablet (20 mg total) by mouth nightly.    prednisoLONE  acetate (PRED FORTE ) 1 % ophthalmic suspension Place 1 drop into the right eye 4 (four) times daily.    prochlorperazine  (COMPAZINE ) 10 MG tablet Take 1 tablet (10 mg total) by mouth every 6 (six) hours as needed for nausea or vomiting. 12/06/2023: Hasn't started   Spacer/Aero-Holding Raguel DEVI Use as directed    traMADol  (ULTRAM ) 50 MG tablet Take 1 tablet (50 mg total) by mouth every 6 (six) hours as needed for moderate pain (4-6).    VEVYE 0.1 % SOLN Place 1 drop into both eyes 2 (two) times daily.    zolpidem  (AMBIEN  CR) 6.25 MG CR tablet Take 1 tablet (6.25 mg total) by mouth at bedtime as needed for sleep    [DISCONTINUED] lisinopril  (ZESTRIL ) 20 MG tablet Take 1 tablet (20 mg total) by mouth daily.    No facility-administered encounter medications on file as of 01/02/2024.     There were no vitals filed for this visit. There is no height or weight on file to calculate BMI.   ECOG PERFORMANCE STATUS: {CHL ONC ECOG [phone]}  PHYSICAL EXAM GENERAL:alert, no distress and comfortable SKIN: no rash  EYES: sclera clear NECK: without mass LYMPH:  no palpable cervical or supraclavicular lymphadenopathy  LUNGS: clear with normal breathing effort HEART: regular rate & rhythm, no lower extremity edema ABDOMEN: abdomen soft, non-tender and normal bowel sounds NEURO: alert & oriented x 3 with fluent speech, no focal motor/sensory deficits Breast exam:  PAC without erythema    CBC    Latest Ref Rng & Units 12/13/2023    8:16 AM 11/22/2023    2:13 PM 06/02/2023    2:45 AM  CBC  WBC 4.0 - 10.5 K/uL 8.8  6.5  7.4   Hemoglobin 12.0 - 15.0 g/dL 85.8  84.6  84.5   Hematocrit 36.0 - 46.0 % 42.7  47.1  46.9   Platelets 150 - 400 K/uL 253  247  320       CMP     Latest Ref Rng & Units 12/13/2023    8:16 AM 11/22/2023    2:13 PM 06/02/2023    2:45 AM  CMP  Glucose 70 - 99 mg/dL 866  887  879   BUN 8 - 23 mg/dL 16  11  15     Creatinine 0.44 - 1.00 mg/dL 9.32  9.39  9.32   Sodium 135 - 145 mmol/L 142  139  136   Potassium 3.5 - 5.1 mmol/L 3.9  3.7  3.3   Chloride 98 - 111 mmol/L 104  103  101   CO2 22 - 32 mmol/L 30  32  27   Calcium  8.9 - 10.3 mg/dL 9.9  9.6  89.8   Total Protein 6.5 - 8.1 g/dL 8.0  7.7    Total Bilirubin 0.0 - 1.2 mg/dL 0.4  0.5  Alkaline Phos 38 - 126 U/L 130  125    AST 15 - 41 U/L 19  18    ALT 0 - 44 U/L 20  18        ASSESSMENT & PLAN:  # 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 - Initiated neoadjuvant chemotherapy with TCH regimen at 75% dose. - Baseline ECHO performed, satisfactory   PLAN:  No orders of the defined types were placed in this encounter.     All questions were answered. The patient knows to call the clinic with any problems, questions or concerns. No barriers to learning were detected. I spent *** counseling the patient face to face. The total time spent in the appointment was *** and more than 50% was on counseling, review of test results, and coordination of care.   Crews Mccollam K Brylee Mcgreal, NP 12/31/2023 8:07 PM  "

## 2024-01-02 ENCOUNTER — Other Ambulatory Visit: Payer: Self-pay

## 2024-01-02 ENCOUNTER — Other Ambulatory Visit (HOSPITAL_COMMUNITY): Payer: Self-pay

## 2024-01-02 ENCOUNTER — Inpatient Hospital Stay

## 2024-01-02 ENCOUNTER — Inpatient Hospital Stay: Admitting: Nurse Practitioner

## 2024-01-02 ENCOUNTER — Encounter: Payer: Self-pay | Admitting: Nurse Practitioner

## 2024-01-02 VITALS — BP 138/88 | HR 86 | Temp 98.6°F | Resp 17 | Wt 344.8 lb

## 2024-01-02 DIAGNOSIS — Z171 Estrogen receptor negative status [ER-]: Secondary | ICD-10-CM

## 2024-01-02 DIAGNOSIS — R0600 Dyspnea, unspecified: Secondary | ICD-10-CM | POA: Diagnosis not present

## 2024-01-02 DIAGNOSIS — C50512 Malignant neoplasm of lower-outer quadrant of left female breast: Secondary | ICD-10-CM | POA: Diagnosis not present

## 2024-01-02 DIAGNOSIS — Z5112 Encounter for antineoplastic immunotherapy: Secondary | ICD-10-CM | POA: Diagnosis not present

## 2024-01-02 LAB — CBC WITH DIFFERENTIAL (CANCER CENTER ONLY)
Abs Immature Granulocytes: 0.02 K/uL (ref 0.00–0.07)
Basophils Absolute: 0 K/uL (ref 0.0–0.1)
Basophils Relative: 1 %
Eosinophils Absolute: 0 K/uL (ref 0.0–0.5)
Eosinophils Relative: 0 %
HCT: 40.6 % (ref 36.0–46.0)
Hemoglobin: 13.4 g/dL (ref 12.0–15.0)
Immature Granulocytes: 0 %
Lymphocytes Relative: 10 %
Lymphs Abs: 0.6 K/uL — ABNORMAL LOW (ref 0.7–4.0)
MCH: 25.6 pg — ABNORMAL LOW (ref 26.0–34.0)
MCHC: 33 g/dL (ref 30.0–36.0)
MCV: 77.6 fL — ABNORMAL LOW (ref 80.0–100.0)
Monocytes Absolute: 0.1 K/uL (ref 0.1–1.0)
Monocytes Relative: 1 %
Neutro Abs: 5 K/uL (ref 1.7–7.7)
Neutrophils Relative %: 88 %
Platelet Count: 268 K/uL (ref 150–400)
RBC: 5.23 MIL/uL — ABNORMAL HIGH (ref 3.87–5.11)
RDW: 20.2 % — ABNORMAL HIGH (ref 11.5–15.5)
WBC Count: 5.6 K/uL (ref 4.0–10.5)
nRBC: 0.5 % — ABNORMAL HIGH (ref 0.0–0.2)

## 2024-01-02 LAB — CMP (CANCER CENTER ONLY)
ALT: 21 U/L (ref 0–44)
AST: 21 U/L (ref 15–41)
Albumin: 4.3 g/dL (ref 3.5–5.0)
Alkaline Phosphatase: 146 U/L — ABNORMAL HIGH (ref 38–126)
Anion gap: 8 (ref 5–15)
BUN: 14 mg/dL (ref 8–23)
CO2: 30 mmol/L (ref 22–32)
Calcium: 9.9 mg/dL (ref 8.9–10.3)
Chloride: 103 mmol/L (ref 98–111)
Creatinine: 0.69 mg/dL (ref 0.44–1.00)
GFR, Estimated: >60 mL/min
Glucose, Bld: 151 mg/dL — ABNORMAL HIGH (ref 70–99)
Potassium: 4.1 mmol/L (ref 3.5–5.1)
Sodium: 141 mmol/L (ref 135–145)
Total Bilirubin: 0.5 mg/dL (ref 0.0–1.2)
Total Protein: 8.1 g/dL (ref 6.5–8.1)

## 2024-01-02 MED ORDER — ACETAMINOPHEN 325 MG PO TABS
650.0000 mg | ORAL_TABLET | Freq: Once | ORAL | Status: AC
  Administered 2024-01-02: 650 mg via ORAL
  Filled 2024-01-02: qty 2

## 2024-01-02 MED ORDER — PALONOSETRON HCL INJECTION 0.25 MG/5ML
0.2500 mg | Freq: Once | INTRAVENOUS | Status: AC
  Administered 2024-01-02: 0.25 mg via INTRAVENOUS
  Filled 2024-01-02: qty 5

## 2024-01-02 MED ORDER — NYSTATIN 100000 UNIT/ML MT SUSP
5.0000 mL | Freq: Four times a day (QID) | OROMUCOSAL | 1 refills | Status: AC | PRN
  Filled 2024-01-02: qty 140, 7d supply, fill #0

## 2024-01-02 MED ORDER — DIPHENHYDRAMINE HCL 25 MG PO CAPS
50.0000 mg | ORAL_CAPSULE | Freq: Once | ORAL | Status: AC
  Administered 2024-01-02: 50 mg via ORAL
  Filled 2024-01-02: qty 2

## 2024-01-02 MED ORDER — SODIUM CHLORIDE 0.9 % IV SOLN
50.0000 mg/m2 | Freq: Once | INTRAVENOUS | Status: AC
  Administered 2024-01-02: 132 mg via INTRAVENOUS
  Filled 2024-01-02: qty 13.2

## 2024-01-02 MED ORDER — SODIUM CHLORIDE 0.9 % IV SOLN: INTRAVENOUS | Status: DC

## 2024-01-02 MED ORDER — TRASTUZUMAB-ANNS CHEMO 150 MG IV SOLR
6.0000 mg/kg | Freq: Once | INTRAVENOUS | Status: AC
  Administered 2024-01-02: 1050 mg via INTRAVENOUS
  Filled 2024-01-02: qty 50

## 2024-01-02 MED ORDER — APREPITANT 130 MG/18ML IV EMUL
130.0000 mg | Freq: Once | INTRAVENOUS | Status: AC
  Administered 2024-01-02: 130 mg via INTRAVENOUS
  Filled 2024-01-02: qty 18

## 2024-01-02 MED ORDER — SODIUM CHLORIDE 0.9 % IV SOLN
600.0000 mg | Freq: Once | INTRAVENOUS | Status: AC
  Administered 2024-01-02: 600 mg via INTRAVENOUS
  Filled 2024-01-02: qty 60

## 2024-01-02 MED ORDER — SODIUM CHLORIDE 0.9% FLUSH: 10.0000 mL | INTRAVENOUS | Status: DC | PRN

## 2024-01-02 MED ORDER — DEXAMETHASONE SOD PHOSPHATE PF 10 MG/ML IJ SOLN
10.0000 mg | Freq: Once | INTRAMUSCULAR | Status: AC
  Administered 2024-01-02: 10 mg via INTRAVENOUS

## 2024-01-02 NOTE — Patient Instructions (Signed)
 [facility] - A DEPT OF [facility]  Discharge Instructions: Thank you for choosing [facility] to provide your oncology and hematology care.   If you have a lab appointment with the Cancer Center, please go directly to the Cancer Center and check in at the registration area.   Wear comfortable clothing and clothing appropriate for easy access to any Portacath or PICC line.   We strive to give you quality time with your provider. You may need to reschedule your appointment if you arrive late (15 or more minutes).  Arriving late affects you and other patients whose appointments are after yours.  Also, if you miss three or more appointments without notifying the office, you may be dismissed from the clinic at the providers discretion.      For prescription refill requests, have your pharmacy contact our office and allow 72 hours for refills to be completed.    Today you received the following chemotherapy and/or immunotherapy agents kanjinti  docetaxel  and carboplatin        To help prevent nausea and vomiting after your treatment, we encourage you to take your nausea medication as directed.  BELOW ARE SYMPTOMS THAT SHOULD BE REPORTED IMMEDIATELY: *FEVER GREATER THAN 100.4 F (38 C) OR HIGHER *CHILLS OR SWEATING *NAUSEA AND VOMITING THAT IS NOT CONTROLLED WITH YOUR NAUSEA MEDICATION *UNUSUAL SHORTNESS OF BREATH *UNUSUAL BRUISING OR BLEEDING *URINARY PROBLEMS (pain or burning when urinating, or frequent urination) *BOWEL PROBLEMS (unusual diarrhea, constipation, pain near the anus) TENDERNESS IN MOUTH AND THROAT WITH OR WITHOUT PRESENCE OF ULCERS (sore throat, sores in mouth, or a toothache) UNUSUAL RASH, SWELLING OR PAIN  UNUSUAL VAGINAL DISCHARGE OR ITCHING   Items with * indicate a potential emergency and should be followed up as soon as possible or go to the Emergency Department if any problems should occur.  Please show the CHEMOTHERAPY  ALERT CARD or IMMUNOTHERAPY ALERT CARD at check-in to the Emergency Department and triage nurse.  Should you have questions after your visit or need to cancel or reschedule your appointment, please contact [facility]  Dept: [phone]  and follow the prompts.  Office hours are 8:00 a.m. to 4:30 p.m. Monday - Friday. Please note that voicemails left after 4:00 p.m. may not be returned until the following business day.  We are closed weekends and major holidays. You have access to a nurse at all times for urgent questions. Please call the main number to the clinic Dept: [phone] and follow the prompts.   For any non-urgent questions, you may also contact your provider using MyChart. We now offer e-Visits for anyone 62 and older to request care online for non-urgent symptoms. For details visit [email or web address].   Also download the MyChart app! Go to the app store, search MyChart, open the app, select [place], and log in with your MyChart username and password.

## 2024-01-03 ENCOUNTER — Other Ambulatory Visit (HOSPITAL_COMMUNITY): Payer: Self-pay

## 2024-01-05 ENCOUNTER — Other Ambulatory Visit (HOSPITAL_COMMUNITY): Payer: Self-pay

## 2024-01-05 ENCOUNTER — Inpatient Hospital Stay

## 2024-01-05 ENCOUNTER — Other Ambulatory Visit: Payer: Self-pay

## 2024-01-05 ENCOUNTER — Telehealth: Payer: Self-pay | Admitting: Hematology and Oncology

## 2024-01-05 ENCOUNTER — Other Ambulatory Visit (HOSPITAL_BASED_OUTPATIENT_CLINIC_OR_DEPARTMENT_OTHER): Payer: Self-pay

## 2024-01-05 MED ORDER — PRAVASTATIN SODIUM 20 MG PO TABS
20.0000 mg | ORAL_TABLET | Freq: Every evening | ORAL | 0 refills | Status: AC
  Filled 2024-01-05: qty 90, 90d supply, fill #0

## 2024-01-05 MED ORDER — TRAMADOL HCL 50 MG PO TABS
50.0000 mg | ORAL_TABLET | Freq: Four times a day (QID) | ORAL | 0 refills | Status: AC | PRN
  Filled 2024-01-06 – 2024-01-15 (×2): qty 120, 30d supply, fill #0

## 2024-01-05 MED ORDER — POTASSIUM CHLORIDE CRYS ER 20 MEQ PO TBCR
40.0000 meq | EXTENDED_RELEASE_TABLET | Freq: Every day | ORAL | 1 refills | Status: AC
  Filled 2024-01-05: qty 180, 90d supply, fill #0

## 2024-01-05 NOTE — Telephone Encounter (Signed)
 I SPOKE TO PATIENT AND SHE IS AWARE OF RESCHEDULED INJECTION APPOINTMENT ON 01/06/2024.

## 2024-01-06 ENCOUNTER — Other Ambulatory Visit: Payer: Self-pay

## 2024-01-06 ENCOUNTER — Inpatient Hospital Stay

## 2024-01-06 VITALS — BP 195/107 | HR 68 | Temp 97.6°F | Resp 17

## 2024-01-06 DIAGNOSIS — Z5112 Encounter for antineoplastic immunotherapy: Secondary | ICD-10-CM | POA: Diagnosis not present

## 2024-01-06 DIAGNOSIS — Z171 Estrogen receptor negative status [ER-]: Secondary | ICD-10-CM

## 2024-01-06 MED ORDER — PEGFILGRASTIM-FPGK 6 MG/0.6ML ~~LOC~~ SOSY
6.0000 mg | PREFILLED_SYRINGE | Freq: Once | SUBCUTANEOUS | Status: AC
  Administered 2024-01-06: 6 mg via SUBCUTANEOUS

## 2024-01-08 ENCOUNTER — Other Ambulatory Visit: Payer: Self-pay

## 2024-01-09 ENCOUNTER — Other Ambulatory Visit: Payer: Self-pay

## 2024-01-09 ENCOUNTER — Encounter: Payer: Self-pay | Admitting: Pharmacist

## 2024-01-09 ENCOUNTER — Other Ambulatory Visit (HOSPITAL_COMMUNITY): Payer: Self-pay

## 2024-01-15 ENCOUNTER — Other Ambulatory Visit (HOSPITAL_COMMUNITY): Payer: Self-pay

## 2024-01-15 ENCOUNTER — Other Ambulatory Visit: Payer: Self-pay

## 2024-01-15 MED ORDER — CARVEDILOL 12.5 MG PO TABS
12.5000 mg | ORAL_TABLET | Freq: Two times a day (BID) | ORAL | 1 refills | Status: AC
  Filled 2024-01-15: qty 180, 90d supply, fill #0

## 2024-01-15 MED ORDER — PANTOPRAZOLE SODIUM 40 MG PO TBEC
40.0000 mg | DELAYED_RELEASE_TABLET | Freq: Every day | ORAL | 0 refills | Status: AC
  Filled 2024-01-15: qty 90, 90d supply, fill #0

## 2024-01-15 MED ORDER — ALBUTEROL SULFATE HFA 108 (90 BASE) MCG/ACT IN AERS
INHALATION_SPRAY | RESPIRATORY_TRACT | 1 refills | Status: AC
  Filled 2024-01-15: qty 6.7, 25d supply, fill #0

## 2024-01-23 ENCOUNTER — Other Ambulatory Visit (HOSPITAL_BASED_OUTPATIENT_CLINIC_OR_DEPARTMENT_OTHER): Payer: Self-pay

## 2024-01-25 ENCOUNTER — Inpatient Hospital Stay

## 2024-01-25 ENCOUNTER — Inpatient Hospital Stay: Attending: Licensed Clinical Social Worker | Admitting: Hematology and Oncology

## 2024-01-25 ENCOUNTER — Inpatient Hospital Stay: Attending: Licensed Clinical Social Worker

## 2024-01-25 VITALS — BP 180/92 | HR 78 | Temp 97.6°F | Resp 18 | Wt 356.4 lb

## 2024-01-25 VITALS — BP 135/72 | HR 55 | Temp 98.1°F | Resp 19

## 2024-01-25 DIAGNOSIS — Z95828 Presence of other vascular implants and grafts: Secondary | ICD-10-CM | POA: Insufficient documentation

## 2024-01-25 DIAGNOSIS — Z79620 Long term (current) use of immunosuppressive biologic: Secondary | ICD-10-CM | POA: Diagnosis not present

## 2024-01-25 DIAGNOSIS — Z5189 Encounter for other specified aftercare: Secondary | ICD-10-CM | POA: Insufficient documentation

## 2024-01-25 DIAGNOSIS — Z171 Estrogen receptor negative status [ER-]: Secondary | ICD-10-CM | POA: Diagnosis not present

## 2024-01-25 DIAGNOSIS — Z5112 Encounter for antineoplastic immunotherapy: Secondary | ICD-10-CM | POA: Diagnosis present

## 2024-01-25 DIAGNOSIS — C50512 Malignant neoplasm of lower-outer quadrant of left female breast: Secondary | ICD-10-CM

## 2024-01-25 DIAGNOSIS — Z5111 Encounter for antineoplastic chemotherapy: Secondary | ICD-10-CM | POA: Diagnosis present

## 2024-01-25 DIAGNOSIS — Z79633 Long term (current) use of mitotic inhibitor: Secondary | ICD-10-CM | POA: Diagnosis not present

## 2024-01-25 DIAGNOSIS — Z79630 Long term (current) use of alkylating agent: Secondary | ICD-10-CM | POA: Diagnosis not present

## 2024-01-25 LAB — CMP (CANCER CENTER ONLY)
ALT: 31 U/L (ref 0–44)
AST: 25 U/L (ref 15–41)
Albumin: 4 g/dL (ref 3.5–5.0)
Alkaline Phosphatase: 117 U/L (ref 38–126)
Anion gap: 9 (ref 5–15)
BUN: 13 mg/dL (ref 8–23)
CO2: 29 mmol/L (ref 22–32)
Calcium: 9.5 mg/dL (ref 8.9–10.3)
Chloride: 104 mmol/L (ref 98–111)
Creatinine: 0.76 mg/dL (ref 0.44–1.00)
GFR, Estimated: >60 mL/min
Glucose, Bld: 105 mg/dL — ABNORMAL HIGH (ref 70–99)
Potassium: 3.7 mmol/L (ref 3.5–5.1)
Sodium: 143 mmol/L (ref 135–145)
Total Bilirubin: 0.4 mg/dL (ref 0.0–1.2)
Total Protein: 7.3 g/dL (ref 6.5–8.1)

## 2024-01-25 LAB — CBC WITH DIFFERENTIAL (CANCER CENTER ONLY)
Abs Immature Granulocytes: 0.02 K/uL (ref 0.00–0.07)
Basophils Absolute: 0.1 K/uL (ref 0.0–0.1)
Basophils Relative: 1 %
Eosinophils Absolute: 0.2 K/uL (ref 0.0–0.5)
Eosinophils Relative: 4 %
HCT: 35.7 % — ABNORMAL LOW (ref 36.0–46.0)
Hemoglobin: 11.8 g/dL — ABNORMAL LOW (ref 12.0–15.0)
Immature Granulocytes: 0 %
Lymphocytes Relative: 32 %
Lymphs Abs: 1.7 K/uL (ref 0.7–4.0)
MCH: 26 pg (ref 26.0–34.0)
MCHC: 33.1 g/dL (ref 30.0–36.0)
MCV: 78.8 fL — ABNORMAL LOW (ref 80.0–100.0)
Monocytes Absolute: 0.8 K/uL (ref 0.1–1.0)
Monocytes Relative: 16 %
Neutro Abs: 2.5 K/uL (ref 1.7–7.7)
Neutrophils Relative %: 47 %
Platelet Count: 268 K/uL (ref 150–400)
RBC: 4.53 MIL/uL (ref 3.87–5.11)
RDW: 21.8 % — ABNORMAL HIGH (ref 11.5–15.5)
WBC Count: 5.3 K/uL (ref 4.0–10.5)
nRBC: 0 % (ref 0.0–0.2)

## 2024-01-25 MED ORDER — DIPHENHYDRAMINE HCL 25 MG PO CAPS
50.0000 mg | ORAL_CAPSULE | Freq: Once | ORAL | Status: AC
  Administered 2024-01-25: 50 mg via ORAL
  Filled 2024-01-25: qty 2

## 2024-01-25 MED ORDER — SODIUM CHLORIDE 0.9 % IV SOLN
600.0000 mg | Freq: Once | INTRAVENOUS | Status: AC
  Administered 2024-01-25: 600 mg via INTRAVENOUS
  Filled 2024-01-25: qty 60

## 2024-01-25 MED ORDER — SODIUM CHLORIDE 0.9% FLUSH: 10.0000 mL | INTRAVENOUS | Status: DC | PRN

## 2024-01-25 MED ORDER — CLONIDINE HCL 0.1 MG PO TABS
0.1000 mg | ORAL_TABLET | Freq: Once | ORAL | Status: AC
  Administered 2024-01-25: 0.1 mg via ORAL
  Filled 2024-01-25: qty 1

## 2024-01-25 MED ORDER — TRASTUZUMAB-ANNS CHEMO 150 MG IV SOLR
6.0000 mg/kg | Freq: Once | INTRAVENOUS | Status: AC
  Administered 2024-01-25: 1050 mg via INTRAVENOUS
  Filled 2024-01-25: qty 50

## 2024-01-25 MED ORDER — PALONOSETRON HCL INJECTION 0.25 MG/5ML
0.2500 mg | Freq: Once | INTRAVENOUS | Status: AC
  Administered 2024-01-25: 0.25 mg via INTRAVENOUS
  Filled 2024-01-25: qty 5

## 2024-01-25 MED ORDER — ACETAMINOPHEN 325 MG PO TABS
650.0000 mg | ORAL_TABLET | Freq: Once | ORAL | Status: AC
  Administered 2024-01-25: 650 mg via ORAL
  Filled 2024-01-25: qty 2

## 2024-01-25 MED ORDER — DEXAMETHASONE SOD PHOSPHATE PF 10 MG/ML IJ SOLN
10.0000 mg | Freq: Once | INTRAMUSCULAR | Status: AC
  Administered 2024-01-25: 10 mg via INTRAVENOUS
  Filled 2024-01-25: qty 1

## 2024-01-25 MED ORDER — APREPITANT 130 MG/18ML IV EMUL
130.0000 mg | Freq: Once | INTRAVENOUS | Status: AC
  Administered 2024-01-25: 130 mg via INTRAVENOUS
  Filled 2024-01-25: qty 18

## 2024-01-25 MED ORDER — SODIUM CHLORIDE 0.9 % IV SOLN
50.0000 mg/m2 | Freq: Once | INTRAVENOUS | Status: AC
  Administered 2024-01-25: 132 mg via INTRAVENOUS
  Filled 2024-01-25: qty 13.2

## 2024-01-25 MED ORDER — SODIUM CHLORIDE 0.9% FLUSH
10.0000 mL | Freq: Once | INTRAVENOUS | Status: AC
  Administered 2024-01-25: 10 mL

## 2024-01-25 MED ORDER — SODIUM CHLORIDE 0.9 % IV SOLN: INTRAVENOUS | Status: DC

## 2024-01-25 NOTE — Patient Instructions (Addendum)
 [facility]  Discharge Instructions: Thank you for choosing [facility] to provide your oncology and hematology care.   If you have a lab appointment with the Cancer Center, please go directly to the Cancer Center and check in at the registration area.   Wear comfortable clothing and clothing appropriate for easy access to any Portacath or PICC line.   We strive to give you quality time with your provider. You may need to reschedule your appointment if you arrive late (15 or more minutes).  Arriving late affects you and other patients whose appointments are after yours.  Also, if you miss three or more appointments without notifying the office, you may be dismissed from the clinic at the provider's discretion.      For prescription refill requests, have your pharmacy contact our office and allow 72 hours for refills to be completed.    Today you received the following chemotherapy and/or immunotherapy agents: Kanjinti/Docetaxel/Carboplatin      To help prevent nausea and vomiting after your treatment, we encourage you to take your nausea medication as directed.  BELOW ARE SYMPTOMS THAT SHOULD BE REPORTED IMMEDIATELY: *FEVER GREATER THAN 100.4 F (38 C) OR HIGHER *CHILLS OR SWEATING *NAUSEA AND VOMITING THAT IS NOT CONTROLLED WITH YOUR NAUSEA MEDICATION *UNUSUAL SHORTNESS OF BREATH *UNUSUAL BRUISING OR BLEEDING *URINARY PROBLEMS (pain or burning when urinating, or frequent urination) *BOWEL PROBLEMS (unusual diarrhea, constipation, pain near the anus) TENDERNESS IN MOUTH AND THROAT WITH OR WITHOUT PRESENCE OF ULCERS (sore throat, sores in mouth, or a toothache) UNUSUAL RASH, SWELLING OR PAIN  UNUSUAL VAGINAL DISCHARGE OR ITCHING   Items with * indicate a potential emergency and should be followed up as soon as possible or go to the Emergency Department if any problems should occur.  Please show the CHEMOTHERAPY ALERT  CARD or IMMUNOTHERAPY ALERT CARD at check-in to the Emergency Department and triage nurse.  Should you have questions after your visit or need to cancel or reschedule your appointment, please contact [facility] - A DEPT OF [facility]  Dept: [phone]  and follow the prompts.  Office hours are 8:00 a.m. to 4:30 p.m. Monday - Friday. Please note that voicemails left after 4:00 p.m. may not be returned until the following business day.  We are closed weekends and major holidays. You have access to a nurse at all times for urgent questions. Please call the main number to the clinic Dept: [phone] and follow the prompts.   For any non-urgent questions, you may also contact your provider using MyChart. We now offer e-Visits for anyone 69 and older to request care online for non-urgent symptoms. For details visit [email or web address].   Also download the MyChart app! Go to the app store, search "MyChart", open the app, select [place], and log in with your MyChart username and password.

## 2024-01-25 NOTE — Progress Notes (Signed)
 "     [facility]   [phone] [phone]    Patient Care Team: Joshua Santana CROME, NP as PCP - General (Nurse Practitioner) Tyree Nanetta SAILOR, RN as Oncology Nurse Navigator Vernetta Berg, MD as Consulting Physician (General Surgery) Loretha Ash, MD as Consulting Physician (Hematology and Oncology) Shannon Agent, MD as Consulting Physician (Radiation Oncology)   CHIEF COMPLAINT: Follow up HER2 + breast cancer   CURRENT THERAPY: Neoadjuvant TCH with 25% dose reduction; starting 12/13/23  INTERVAL HISTORY   Discussed the use of AI scribe software for clinical note transcription with the patient, who gave verbal consent to proceed.  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  She reports significant fatigue, particularly during the first five days post-infusion, describing the sensation as similar to intoxication. She denies peripheral neuropathy, including tingling or numbness in her extremities.  She has developed dysgeusia, with persistent loss of taste and aversion to food, resulting in reduced oral intake to once or twice daily. Despite this, she maintains adequate hydration and occasionally supplements with Ensure. She notes a weight increase from 344.6 lbs to 354.4 lbs since her last visit, without perceiving increased tightness in her clothing, and suspects fluid retention related to her medications. She is unable to assess the current status of her left breast mass but recalls that it was not palpable at her last visit and can still identify the previous location.  She continues to experience exertional dyspnea, which she attributes to fatigue, but denies dyspnea at rest. She recently had a productive cough with significant sputum, which resolved with  home remedies. She does not require supplemental oxygen  and prefers to manage her respiratory symptoms independently. She has previously been evaluated by pulmonology. She missed a recent follow-up call and requests assistance in rescheduling.  She is currently taking olmesartan , carvedilol , and amlodipine  for hypertension, with medication timing potentially contributing to morning blood pressure fluctuations. She is not aware of the exact dose of olmesartan  but confirms the dosing schedule for her other antihypertensives. She expresses concern regarding the complexity of her regimen and is open to adjusting the schedule to improve blood pressure control.   Past Medical History:  Diagnosis Date   Arthritis    Breast cancer (HCC)    CHF (congestive heart failure) (HCC)    Hypertension      Past Surgical History:  Procedure Laterality Date   ABDOMINAL HYSTERECTOMY     APPENDECTOMY     COSMETIC SURGERY     mass removed from shoulder/scapula area   IR IMAGING GUIDED PORT INSERTION  12/06/2023     Outpatient Encounter Medications as of 01/25/2024  Medication Sig Note   albuterol  (PROVENTIL ) (2.5 MG/3ML) 0.083% nebulizer solution Inhale 3 mL (2.5 mg total) into the lungs via nebulization every 8 (eight) hours as needed for wheezing or shortness of breath.    albuterol  (VENTOLIN  HFA) 108 (90 Base) MCG/ACT inhaler Inhale 2 puffs into the lungs every 6 (six) hours as needed. 05/31/2023: Unable to remove duplicate entry without DC refills.    albuterol  (VENTOLIN  HFA) 108 (90 Base) MCG/ACT inhaler Inhale 2 puffs into the lungs every 6 (six) hours as needed for wheezing (cough).    albuterol  (VENTOLIN  HFA) 108 (90 Base) MCG/ACT inhaler Inhale 2 puffs every 6 (six) hours as needed for wheezing (cough).    amLODipine  (NORVASC )  10 MG tablet Take 1 tablet (10 mg total) by mouth daily.    Ascorbic Acid  (VITAMIN C CR) 500 MG CPCR Take 500 mg by mouth daily.    BACILLUS COAGULANS-INULIN PO Take 1 tablet by  mouth daily. 12/06/2023: Hasn't started   budesonide -formoterol  (SYMBICORT ) 160-4.5 MCG/ACT inhaler Inhale 2 puffs into the lungs in the morning and at bedtime. 12/06/2023: Not using causes her to wheeze, her doctor is aware   budesonide -glycopyrrolate -formoterol  (BREZTRI  AEROSPHERE) 160-9-4.8 MCG/ACT AERO inhaler Inhale 2 puffs into the lungs 2 (two) times daily.    carvedilol  (COREG ) 12.5 MG tablet Take 1 tablet (12.5 mg total) by mouth 2 (two) times daily in the morning and evening with a meal.    dexamethasone  (DECADRON ) 4 MG tablet Take 2 tabs by mouth 2 times daily starting day before chemo. Then take 2 tabs daily for 2 days starting day after chemo. Take with food. 12/06/2023: Hasn't started   diclofenac  Sodium (VOLTAREN ) 1 % GEL Apply 1 g topically 3 (three) times a day.    ergocalciferol (VITAMIN D2) 1.25 MG (50000 UT) capsule Take 50,000 Units by mouth once a week.    fluticasone  (FLONASE ) 50 MCG/ACT nasal spray Administer 2 sprays in each nostril daily. 12/06/2023: Hasn't started   furosemide  (LASIX ) 40 MG tablet Take 1 tablet (40 mg total) by mouth daily.    lidocaine -prilocaine  (EMLA ) cream Apply to affected area once as directed. 12/06/2023: Hasn't started   magic mouthwash (nystatin , lidocaine , diphenhydrAMINE , alum & mag hydroxide) suspension Take 5 mLs by mouth 4 (four) times daily as needed for mouth pain.    olmesartan  (BENICAR ) 40 MG tablet Take 1 tablet (40 mg total) by mouth daily. Stop lisinopril .    olmesartan  (BENICAR ) 40 MG tablet Take 1 tablet (40 mg total) by mouth daily.    ondansetron  (ZOFRAN ) 8 MG tablet Take 1 tablet (8 mg total) by mouth every 8 (eight) hours as needed for nausea or vomiting. Start on the third day after chemotherapy. 12/06/2023: Hasn't started   OVER THE COUNTER MEDICATION Take 2 capsules by mouth daily. FISH OIL OMEGA 3 VIT C VIT E 2000-650-12MG     pantoprazole  (PROTONIX ) 40 MG tablet Take 1 tablet (40 mg total) by mouth daily.    potassium chloride   SA (KLOR-CON  M) 20 MEQ tablet Take 2 tablets (40 mEq total) by mouth daily.    pravastatin  (PRAVACHOL ) 20 MG tablet Take 1 tablet (20 mg total) by mouth nightly.    prednisoLONE  acetate (PRED FORTE ) 1 % ophthalmic suspension Place 1 drop into the right eye 4 (four) times daily.    prochlorperazine  (COMPAZINE ) 10 MG tablet Take 1 tablet (10 mg total) by mouth every 6 (six) hours as needed for nausea or vomiting. 12/06/2023: Hasn't started   Spacer/Aero-Holding [facility] Use as directed    traMADol  (ULTRAM ) 50 MG tablet Take 1 tablet (50 mg total) by mouth every 6 (six) hours as needed for moderate pain (4-6)    VEVYE 0.1 % SOLN Place 1 drop into both eyes 2 (two) times daily.    zolpidem  (AMBIEN  CR) 6.25 MG CR tablet Take 1 tablet (6.25 mg total) by mouth at bedtime as needed for sleep    [DISCONTINUED] lisinopril  (ZESTRIL ) 20 MG tablet Take 1 tablet (20 mg total) by mouth daily.    No facility-administered encounter medications on file as of 01/25/2024.     Today's Vitals   01/25/24 1022 01/25/24 1026  BP: (!) 187/95 (!) 180/92  Pulse: 78  Resp: 18   Temp: 97.6 F (36.4 C)   TempSrc: Temporal   SpO2: 98%   Weight: (!) 356 lb 6.4 oz (161.7 kg)    Body mass index is 57.52 kg/m.   ECOG PERFORMANCE STATUS: 1 - Symptomatic but completely ambulatory   Physical Exam Constitutional:      Appearance: Normal appearance.  Cardiovascular:     Rate and Rhythm: Normal rate and regular rhythm.     Pulses: Normal pulses.     Heart sounds: Normal heart sounds.  Pulmonary:     Effort: Pulmonary effort is normal.     Breath sounds: Normal breath sounds.  Musculoskeletal:        General: No swelling. Normal range of motion.     Cervical back: Normal range of motion and neck supple. No rigidity.  Lymphadenopathy:     Cervical: No cervical adenopathy.  Skin:    General: Skin is warm and dry.  Neurological:     General: No focal deficit present.     Mental Status: She is alert.       CBC    Latest Ref Rng & Units 01/25/2024    9:42 AM 01/02/2024    8:46 AM 12/13/2023    8:16 AM  CBC  WBC 4.0 - 10.5 K/uL 5.3  5.6  8.8   Hemoglobin 12.0 - 15.0 g/dL 88.1  86.5  85.8   Hematocrit 36.0 - 46.0 % 35.7  40.6  42.7   Platelets 150 - 400 K/uL 268  268  253       CMP     Latest Ref Rng & Units 01/25/2024    9:42 AM 01/02/2024    8:46 AM 12/13/2023    8:16 AM  CMP  Glucose 70 - 99 mg/dL 894  848  866   BUN 8 - 23 mg/dL 13  14  16    Creatinine 0.44 - 1.00 mg/dL 9.23  9.30  9.32   Sodium 135 - 145 mmol/L 143  141  142   Potassium 3.5 - 5.1 mmol/L 3.7  4.1  3.9   Chloride 98 - 111 mmol/L 104  103  104   CO2 22 - 32 mmol/L 29  30  30    Calcium  8.9 - 10.3 mg/dL 9.5  9.9  9.9   Total Protein 6.5 - 8.1 g/dL 7.3  8.1  8.0   Total Bilirubin 0.0 - 1.2 mg/dL 0.4  0.5  0.4   Alkaline Phos 38 - 126 U/L 117  146  130   AST 15 - 41 U/L 25  21  19    ALT 0 - 44 U/L 31  21  20        ASSESSMENT & PLAN:  Assessment & Plan Estrogen receptor negative malignant neoplasm of lower-outer quadrant of left breast Undergoing neoadjuvant chemotherapy with clinical response; left breast mass no longer palpable. Tolerating 80% of planned chemotherapy dose without significant toxicity. Halfway through six cycles; surgery anticipated post-completion. - Continue chemotherapy regimen at 80% dose for six cycles. - Monitor for neuropathy; instruct to report any paresthesia. - Plan surgery one month post-chemotherapy, likely April.  Adverse effects of chemotherapy Experiencing dysgeusia, decreased appetite, and fatigue post-treatment. Weight gain likely from fluid retention. No neuropathy or severe toxicity.  - Encouraged hydration and can consider daily ensure since she hasn't been eating well.  Hypertension Blood pressure control may be suboptimal due to medication timing. On olmesartan , carvedilol , and amlodipine . - Advised to adjust antihypertensive schedule: take carvedilol   morning  and afternoon, amlodipine  in the morning.  History of bronchiolitis with residual symptoms Persistent exertional dyspnea likely from prior bronchiolitis. No supplemental oxygen  needed. - Referred to pulmonology for preoperative evaluation. - Instructed to accept pulmonology appointment when contacted. - Will facilitate repeat contact from pulmonology for scheduling.  Time spent: 40 min  No orders of the defined types were placed in this encounter.   Amber Stalls, MD 01/25/2024   "

## 2024-01-25 NOTE — Progress Notes (Signed)
 SBP 180s-190s in clinic and infusion today - Patient did not take BP meds this AM. Most recent BP 180/92. Order entered for clonidine  0.1 mg PO x1 per Dr. Loretha. Okay to proceed with treatment after dose of clonidine  given per MD.  Harlene Nasuti, PharmD Oncology Infusion Pharmacist 01/25/2024 11:21 AM

## 2024-01-26 ENCOUNTER — Other Ambulatory Visit (HOSPITAL_COMMUNITY): Payer: Self-pay

## 2024-01-26 MED ORDER — TRAZODONE HCL 50 MG PO TABS
50.0000 mg | ORAL_TABLET | Freq: Every day | ORAL | 3 refills | Status: AC
  Filled 2024-01-26: qty 30, 30d supply, fill #0

## 2024-01-27 ENCOUNTER — Inpatient Hospital Stay

## 2024-01-27 VITALS — BP 179/92 | HR 81 | Temp 97.5°F | Resp 18

## 2024-01-27 DIAGNOSIS — Z5111 Encounter for antineoplastic chemotherapy: Secondary | ICD-10-CM | POA: Diagnosis not present

## 2024-01-27 DIAGNOSIS — C50512 Malignant neoplasm of lower-outer quadrant of left female breast: Secondary | ICD-10-CM

## 2024-01-27 MED ORDER — PEGFILGRASTIM-FPGK 6 MG/0.6ML ~~LOC~~ SOSY
6.0000 mg | PREFILLED_SYRINGE | Freq: Once | SUBCUTANEOUS | Status: AC
  Administered 2024-01-27: 6 mg via SUBCUTANEOUS
  Filled 2024-01-27: qty 0.6

## 2024-01-29 ENCOUNTER — Encounter: Payer: Self-pay | Admitting: *Deleted

## 2024-02-01 ENCOUNTER — Other Ambulatory Visit (HOSPITAL_COMMUNITY): Payer: Self-pay

## 2024-02-01 ENCOUNTER — Other Ambulatory Visit: Payer: Self-pay

## 2024-02-02 ENCOUNTER — Ambulatory Visit

## 2024-02-02 VITALS — BP 178/82 | HR 84 | Ht 66.0 in | Wt 341.2 lb

## 2024-02-02 DIAGNOSIS — R0602 Shortness of breath: Secondary | ICD-10-CM

## 2024-02-02 DIAGNOSIS — R0609 Other forms of dyspnea: Secondary | ICD-10-CM

## 2024-02-02 DIAGNOSIS — E8770 Fluid overload, unspecified: Secondary | ICD-10-CM

## 2024-02-02 NOTE — Patient Instructions (Addendum)
" °  VISIT SUMMARY: During your visit, we discussed your ongoing issues with shortness of breath, particularly during physical activities. We reviewed your history of respiratory infections, current treatments for breast cancer, and concerns about weight gain and fluid retention. We also addressed your reluctance to use oxygen  therapy and the potential for obstructive sleep apnea.  YOUR PLAN: -DYSPNEA ON EXERTION AND SUSPECTED ASTHMA: Your shortness of breath during physical activities is likely worsened by asthma and your weight. We have ordered pulmonary function tests to better understand your lung function and performed a walking test to see if you need oxygen  therapy. Continue using your current inhalers, Breztri  and albuterol , as they are helping to manage your symptoms.  -MORBID OBESITY WITH VOLUME OVERLOAD: Your weight and fluid retention are contributing to your shortness of breath. To help manage this, limit your total fluid intake to 55 ounces per day and reduce your salt intake, especially from soy sauce. We have referred you to a dietitian for further nutritional guidance.  -SUSPECTED OBSTRUCTIVE SLEEP APNEA: We suspect that you may have obstructive sleep apnea, which can contribute to your breathing difficulties. We have ordered a home sleep study to evaluate this. If sleep apnea is confirmed, we will discuss the potential benefits of using a CPAP machine to help manage it.  INSTRUCTIONS: Please follow up with the pulmonary function tests and the home sleep study as ordered. Continue using your inhalers as prescribed and follow the dietary recommendations to manage your fluid intake and weight. We will review the results of your tests and discuss further steps at your next appointment.  A low-salt diet for heart failure typically restricts sodium to 1,500-2,000 mg daily to reduce fluid retention and reduce heart workload.   Key instructions include avoiding processed, canned, and fast  foods; choosing fresh fruits, vegetables, and meats; reading nutrition labels for \(<350\) mg of sodium per serving; and using herbs/spices instead of salt.   Core Dietary Guidelines for Heart Failure Target Sodium Intake: Aim for less than 1,500 to 2,000 mg of sodium per day, or as advised by a healthcare provider.Read Labels Diligently:   Check the nutrition facts label for sodium content. Choose products with low-sodium, sodium-free, or no salt added labels. A good guideline is to select products with less than 140 mg of sodium per serving, or no more than 350 mg per serving at maximum.Prioritize Fresh Foods: Focus on fresh fruits, vegetables, unprocessed meats, poultry, fish, and dairy (milk, yogurt), which are naturally low in salt.Avoid High-Sodium Foods: Russell clear of processed, smoked, or cured meats (bacon, sausage, ham), canned soups and vegetables, frozen dinners, soy sauce, and fast food.Season Without Salt: Replace salt with herbs, spices, lemon juice, garlic, vinegar, or no-salt blends.Smart Cooking Techniques: Grill, steam, or roast foods to bring out flavor without needing added salt. Actionable Tips to Reduce Salt Rinse Canned Foods: Rinse canned beans and vegetables to remove up to 40% of the sodium.Control Portion Sizes: Even healthy foods can add up in sodium if consumed in large quantities.Limit Condiments: Avoid high-salt condiments like olives, pickles, soy sauce, and steak sauces.Salt Substitutes: Use with caution, as many contain potassium, which may interfere with some heart medications.    Contains text generated by [employer].   "

## 2024-02-02 NOTE — Progress Notes (Signed)
 "   Subjective:   PATIENT ID: Ashley Warren GENDER: female DOB: [birth year], MRN: [id number]   HPI Discussed the use of AI scribe software for clinical note transcription with the patient, who gave verbal consent to proceed.  History of Present Illness Ashley Warren is a 69 year old female with breast cancer who presents with dyspnea on exertion and shortness of breath. She was referred to the pulmonary clinic for dyspnea on exertion and shortness of breath.  She experiences shortness of breath, particularly during physical activities such as moving around or climbing stairs. Despite normal oxygen  levels at rest, exertion leads to significant dyspnea. She has a history of COVID-19 infections, with the last occurrence in 2024, and a recent RSV infection in February 2025, which she believes has exacerbated her respiratory symptoms. No history of smoking or childhood asthma, but she frequently experiences wheezing.  Currently undergoing chemotherapy and Herceptin  treatment for breast cancer, she has gained weight, which she attributes to fluid retention. She takes Lasix  40 mg for fluid management. She uses Breztri  and albuterol  inhalers to manage respiratory symptoms, which provide relief. She had a CT scan in November and underwent pulmonary function tests at [facility], but has not yet received the results.  She has arthritis and uses breath control to manage pain. She has not worked since December due to health issues and is frustrated by her inability to exercise due to fatigue. Despite having a treadmill at home, she feels too tired to use it.  She has not received flu or COVID-19 vaccinations due to allergies and personal choice. Concerned about weight gain and its impact on her health, she notes that her insurance does not cover weight loss medication. She is a CMT supervisor at [employer] but has been unable to work due to her health.  She cares for her mother at home, who has a  history of using CPAP for sleep apnea, which she found problematic. She is hesitant about using oxygen  therapy.     Past Medical History:  Diagnosis Date   Arthritis    Breast cancer (HCC)    CHF (congestive heart failure) (HCC)    Hypertension      Family History  Problem Relation Age of Onset   Alcohol  abuse Maternal Grandmother    Autism spectrum disorder Sister    Breast cancer Mother    Depression Sister    Hypertension Mother    Hypertension Sister    Osteoarthritis Maternal Grandmother      Social History   Socioeconomic History   Marital status: Divorced    Spouse name: Not on file   Number of children: Not on file   Years of education: Not on file   Highest education level: Not on file  Occupational History   Not on file  Tobacco Use   Smoking status: Never   Smokeless tobacco: Never  Vaping Use   Vaping status: Never Used  Substance and Sexual Activity   Alcohol  use: Yes    Comment: ocially   Drug use: No   Sexual activity: Not on file  Other Topics Concern   Not on file  Social History Narrative   Not on file   Social Drivers of Health   Tobacco Use: Low Risk (02/02/2024)   Patient History    Smoking Tobacco Use: Never    Smokeless Tobacco Use: Never    Passive Exposure: Not on file  Financial Resource Strain: Not on file  Food Insecurity: No  Food Insecurity (11/23/2023)   Epic    Worried About Programme Researcher, Broadcasting/film/video in the Last Year: Never true    Ran Out of Food in the Last Year: Never true  Transportation Needs: No Transportation Needs (11/23/2023)   Epic    Lack of Transportation (Medical): No    Lack of Transportation (Non-Medical): No  Physical Activity: Inactive (07/27/2023)   Received from [facility]   Exercise Vital Sign    On average, how many days per week do you engage in moderate to strenuous exercise (like a brisk walk)?: 0 days    On average, how many minutes do you engage in exercise at this level?: 0 min   Stress: Not on file  Social Connections: Unknown (06/01/2023)   Social Connection and Isolation Panel    Frequency of Communication with Friends and Family: More than three times a week    Frequency of Social Gatherings with Friends and Family: Not on file    Attends Religious Services: Not on file    Active Member of Clubs or Organizations: Not on file    Attends Banker Meetings: Not on file    Marital Status: Not on file  Intimate Partner Violence: Not At Risk (06/01/2023)   Humiliation, Afraid, Rape, and Kick questionnaire    Fear of Current or Ex-Partner: No    Emotionally Abused: No    Physically Abused: No    Sexually Abused: No  Depression (PHQ2-9): Low Risk (01/25/2024)   Depression (PHQ2-9)    PHQ-2 Score: 0  Alcohol  Screen: Not on file  Housing: Low Risk (11/23/2023)   Epic    Unable to Pay for Housing in the Last Year: No    Number of Times Moved in the Last Year: 0    Homeless in the Last Year: No  Utilities: Not At Risk (11/23/2023)   Epic    Threatened with loss of utilities: No  Health Literacy: Not on file     Allergies[1]   Outpatient Medications Prior to Visit  Medication Sig Dispense Refill   nystatin  (MYCOSTATIN ) 100000 UNIT/ML suspension Take by mouth.     albuterol  (PROVENTIL ) (2.5 MG/3ML) 0.083% nebulizer solution Inhale 3 mL (2.5 mg total) into the lungs via nebulization every 8 (eight) hours as needed for wheezing or shortness of breath. 180 mL 0   albuterol  (VENTOLIN  HFA) 108 (90 Base) MCG/ACT inhaler Inhale 2 puffs into the lungs every 6 (six) hours as needed. 6.7 g 1   albuterol  (VENTOLIN  HFA) 108 (90 Base) MCG/ACT inhaler Inhale 2 puffs into the lungs every 6 (six) hours as needed for wheezing (cough). 6.7 g 1   albuterol  (VENTOLIN  HFA) 108 (90 Base) MCG/ACT inhaler Inhale 2 puffs every 6 (six) hours as needed for wheezing (cough). 6.7 g 1   amLODipine  (NORVASC ) 10 MG tablet Take 1 tablet (10 mg total) by mouth daily. 90 tablet 1    Ascorbic Acid  (VITAMIN C CR) 500 MG CPCR Take 500 mg by mouth daily.     BACILLUS COAGULANS-INULIN PO Take 1 tablet by mouth daily.     budesonide -formoterol  (SYMBICORT ) 160-4.5 MCG/ACT inhaler Inhale 2 puffs into the lungs in the morning and at bedtime. 40.8 g 1   budesonide -glycopyrrolate -formoterol  (BREZTRI  AEROSPHERE) 160-9-4.8 MCG/ACT AERO inhaler Inhale 2 puffs into the lungs 2 (two) times daily. 10.7 g 2   carvedilol  (COREG ) 12.5 MG tablet Take 1 tablet (12.5 mg total) by mouth 2 (two) times daily in the morning and evening  with a meal. 180 tablet 1   dexamethasone  (DECADRON ) 4 MG tablet Take 2 tabs by mouth 2 times daily starting day before chemo. Then take 2 tabs daily for 2 days starting day after chemo. Take with food. 30 tablet 1   diclofenac  Sodium (VOLTAREN ) 1 % GEL Apply 1 g topically 3 (three) times a day. 500 g 2   ergocalciferol (VITAMIN D2) 1.25 MG (50000 UT) capsule Take 50,000 Units by mouth once a week.     fluticasone  (FLONASE ) 50 MCG/ACT nasal spray Administer 2 sprays in each nostril daily. 48 g 0   furosemide  (LASIX ) 40 MG tablet Take 1 tablet (40 mg total) by mouth daily. 90 tablet 1   lidocaine -prilocaine  (EMLA ) cream Apply to affected area once as directed. 30 g 3   magic mouthwash (nystatin , lidocaine , diphenhydrAMINE , alum & mag hydroxide) suspension Take 5 mLs by mouth 4 (four) times daily as needed for mouth pain. 140 mL 1   olmesartan  (BENICAR ) 40 MG tablet Take 1 tablet (40 mg total) by mouth daily. 90 tablet 1   ondansetron  (ZOFRAN ) 8 MG tablet Take 1 tablet (8 mg total) by mouth every 8 (eight) hours as needed for nausea or vomiting. Start on the third day after chemotherapy. 30 tablet 1   OVER THE COUNTER MEDICATION Take 2 capsules by mouth daily. FISH OIL OMEGA 3 VIT C VIT E 2000-650-12MG      pantoprazole  (PROTONIX ) 40 MG tablet Take 1 tablet (40 mg total) by mouth daily. 90 tablet 0   potassium chloride  SA (KLOR-CON  M) 20 MEQ tablet Take 2 tablets (40 mEq  total) by mouth daily. 180 tablet 1   pravastatin  (PRAVACHOL ) 20 MG tablet Take 1 tablet (20 mg total) by mouth nightly. 90 tablet 0   prednisoLONE  acetate (PRED FORTE ) 1 % ophthalmic suspension Place 1 drop into the right eye 4 (four) times daily. 10 mL 0   prochlorperazine  (COMPAZINE ) 10 MG tablet Take 1 tablet (10 mg total) by mouth every 6 (six) hours as needed for nausea or vomiting. 30 tablet 1   Spacer/Aero-Holding Chambers DEVI Use as directed 1 each 1   traMADol  (ULTRAM ) 50 MG tablet Take 1 tablet (50 mg total) by mouth every 6 (six) hours as needed for moderate pain (4-6) 120 tablet 0   traZODone  (DESYREL ) 50 MG tablet Take 1 tablet (50 mg total) by mouth at bedtime. *stop zolpidem * 30 tablet 3   VEVYE 0.1 % SOLN Place 1 drop into both eyes 2 (two) times daily.     zolpidem  (AMBIEN  CR) 6.25 MG CR tablet Take 1 tablet (6.25 mg total) by mouth at bedtime as needed for sleep 30 tablet 0   No facility-administered medications prior to visit.    ROS Reviewed all systems and reported negative except as above     Objective:   Vitals:   02/02/24 0955  BP: (!) 180/111  Pulse: 84  SpO2: 96%  Weight: (!) 341 lb 3.2 oz (154.8 kg)  Height: 5' 6 (1.676 m)    Physical Exam Physical Exam GENERAL: Appropriate to age, no acute distress. HEAD EYES EARS NOSE THROAT: Moist mucous membranes, atraumatic, normocephalic. CHEST: Clear to auscultation bilaterally, no wheezing, no crackles, no rales. CARDIAC: Regular rate and rhythm, normal S1, normal S2, no murmurs, no rubs, no gallops. ABDOMEN: Soft, nontender. NEUROLOGICAL: Motor and sensation grossly intact, alert and oriented times X 3. EXTREMITIES: Warm, well perfused, no edema.     CBC    Component Value Date/Time  WBC 5.3 01/25/2024 0942   WBC 7.4 06/02/2023 0245   RBC 4.53 01/25/2024 0942   HGB 11.8 (L) 01/25/2024 0942   HCT 35.7 (L) 01/25/2024 0942   PLT 268 01/25/2024 0942   MCV 78.8 (L) 01/25/2024 0942   MCH 26.0  01/25/2024 0942   MCHC 33.1 01/25/2024 0942   RDW 21.8 (H) 01/25/2024 0942   LYMPHSABS 1.7 01/25/2024 0942   MONOABS 0.8 01/25/2024 0942   EOSABS 0.2 01/25/2024 0942   BASOSABS 0.1 01/25/2024 0942     Results Radiology Chest CT (11/2023): Normal appearing parenchyma without any abnormality to explain shortness of breath        Assessment & Plan:   Assessment and Plan Assessment & Plan Dyspnea on exertion and suspected asthma Dyspnea likely exacerbated by asthma and morbid obesity and volume overload. CT scan normal. On maximum inhaler therapy. No wheezing on exam. - Performed walking test to assess need for oxygen  therapy. - Continue current inhaler therapy with Breztri  and albuterol .  Morbid obesity with volume overload Contributing to dyspnea. Fluid retention noted despite Lasix . Excessive water intake may contribute to volume overload.  Difficulty with weight loss due to chemotherapy side effects. - Limit total fluid intake to 55 ounces per day. - Reduce salt intake, particularly from soy sauce. - Referred to dietitian for nutritional guidance.  Suspected obstructive sleep apnea Contributing to dyspnea. No prior sleep study. Hesitant to use CPAP. Discussed potential benefits of CPAP if confirmed. - Ordered home sleep study to evaluate for obstructive sleep apnea. - Discussed potential benefits of CPAP if sleep apnea is confirmed.        Zola Herter, MD [facility] Office: [phone]       [1]  Allergies Allergen Reactions   Penicillins Itching   "

## 2024-02-06 ENCOUNTER — Other Ambulatory Visit: Payer: Self-pay | Admitting: *Deleted

## 2024-02-06 ENCOUNTER — Telehealth: Payer: Self-pay | Admitting: *Deleted

## 2024-02-06 DIAGNOSIS — R0600 Dyspnea, unspecified: Secondary | ICD-10-CM

## 2024-02-06 DIAGNOSIS — C50512 Malignant neoplasm of lower-outer quadrant of left female breast: Secondary | ICD-10-CM

## 2024-02-06 NOTE — Telephone Encounter (Signed)
 Ashley Warren left VM: Losing voice for past 3 days. Coughing off and on, thick mucus. No fever. Asking advice on what she should do.  Contacted patient. She said she has not home tested for flu/covid/rsv. She said she has not contacted her PCP as she is getting treatment and thought she should contact CC first. She did see a pulmonologist on Friday, but would rather be seen here.  Dr. Loretha informed of patient symptoms and that she is receiving Kanjinti /Taxotere /Paraplatin , last infusion 1/17.  Per Dr. Loretha, if Ashley Kendall, NP has opening on Friday, schedule patient.  Per L. Causey, NP, schedule for Friday 1/30 @ 1230. Labs @ 1145. CXR Thursday.  Contacted patient with plan to see NP and appt times. Ashley Warren verbalized understanding.

## 2024-02-08 ENCOUNTER — Ambulatory Visit (HOSPITAL_COMMUNITY)
Admission: RE | Admit: 2024-02-08 | Discharge: 2024-02-08 | Disposition: A | Discharge status: Home / Self Care | Source: Ambulatory Visit | Attending: Adult Health | Admitting: Adult Health

## 2024-02-08 ENCOUNTER — Other Ambulatory Visit: Payer: Self-pay

## 2024-02-08 DIAGNOSIS — Z171 Estrogen receptor negative status [ER-]: Secondary | ICD-10-CM

## 2024-02-08 DIAGNOSIS — R0600 Dyspnea, unspecified: Secondary | ICD-10-CM | POA: Diagnosis present

## 2024-02-09 ENCOUNTER — Other Ambulatory Visit (HOSPITAL_COMMUNITY): Payer: Self-pay

## 2024-02-09 ENCOUNTER — Other Ambulatory Visit (HOSPITAL_BASED_OUTPATIENT_CLINIC_OR_DEPARTMENT_OTHER): Payer: Self-pay

## 2024-02-09 ENCOUNTER — Inpatient Hospital Stay: Admitting: Adult Health

## 2024-02-09 ENCOUNTER — Encounter: Payer: Self-pay | Admitting: Adult Health

## 2024-02-09 ENCOUNTER — Encounter: Payer: Self-pay | Admitting: Pharmacist

## 2024-02-09 ENCOUNTER — Other Ambulatory Visit: Payer: Self-pay

## 2024-02-09 ENCOUNTER — Inpatient Hospital Stay

## 2024-02-09 ENCOUNTER — Encounter: Payer: Self-pay | Admitting: Hematology and Oncology

## 2024-02-09 ENCOUNTER — Other Ambulatory Visit: Payer: Self-pay | Admitting: *Deleted

## 2024-02-09 VITALS — BP 217/99 | HR 84 | Temp 98.7°F | Resp 20 | Ht 66.0 in | Wt 341.6 lb

## 2024-02-09 DIAGNOSIS — Z171 Estrogen receptor negative status [ER-]: Secondary | ICD-10-CM | POA: Diagnosis not present

## 2024-02-09 DIAGNOSIS — R062 Wheezing: Secondary | ICD-10-CM | POA: Diagnosis not present

## 2024-02-09 DIAGNOSIS — C50512 Malignant neoplasm of lower-outer quadrant of left female breast: Secondary | ICD-10-CM

## 2024-02-09 DIAGNOSIS — Z5111 Encounter for antineoplastic chemotherapy: Secondary | ICD-10-CM | POA: Diagnosis not present

## 2024-02-09 LAB — CBC WITH DIFFERENTIAL (CANCER CENTER ONLY)
Abs Immature Granulocytes: 0.11 10*3/uL — ABNORMAL HIGH (ref 0.00–0.07)
Basophils Absolute: 0.1 10*3/uL (ref 0.0–0.1)
Basophils Relative: 1 %
Eosinophils Absolute: 0 10*3/uL (ref 0.0–0.5)
Eosinophils Relative: 0 %
HCT: 37.2 % (ref 36.0–46.0)
Hemoglobin: 12.3 g/dL (ref 12.0–15.0)
Immature Granulocytes: 1 %
Lymphocytes Relative: 19 %
Lymphs Abs: 1.7 10*3/uL (ref 0.7–4.0)
MCH: 26.2 pg (ref 26.0–34.0)
MCHC: 33.1 g/dL (ref 30.0–36.0)
MCV: 79.1 fL — ABNORMAL LOW (ref 80.0–100.0)
Monocytes Absolute: 1 10*3/uL (ref 0.1–1.0)
Monocytes Relative: 12 %
Neutro Abs: 5.8 10*3/uL (ref 1.7–7.7)
Neutrophils Relative %: 67 %
Platelet Count: 150 10*3/uL (ref 150–400)
RBC: 4.7 MIL/uL (ref 3.87–5.11)
RDW: 22.4 % — ABNORMAL HIGH (ref 11.5–15.5)
WBC Count: 8.7 10*3/uL (ref 4.0–10.5)
nRBC: 0.7 % — ABNORMAL HIGH (ref 0.0–0.2)

## 2024-02-09 LAB — CMP (CANCER CENTER ONLY)
ALT: 23 U/L (ref 0–44)
AST: 22 U/L (ref 15–41)
Albumin: 4.2 g/dL (ref 3.5–5.0)
Alkaline Phosphatase: 138 U/L — ABNORMAL HIGH (ref 38–126)
Anion gap: 11 (ref 5–15)
BUN: 10 mg/dL (ref 8–23)
CO2: 30 mmol/L (ref 22–32)
Calcium: 9.8 mg/dL (ref 8.9–10.3)
Chloride: 101 mmol/L (ref 98–111)
Creatinine: 0.72 mg/dL (ref 0.44–1.00)
GFR, Estimated: >60 mL/min
Glucose, Bld: 112 mg/dL — ABNORMAL HIGH (ref 70–99)
Potassium: 3.5 mmol/L (ref 3.5–5.1)
Sodium: 142 mmol/L (ref 135–145)
Total Bilirubin: 0.4 mg/dL (ref 0.0–1.2)
Total Protein: 7.5 g/dL (ref 6.5–8.1)

## 2024-02-09 LAB — PRO BRAIN NATRIURETIC PEPTIDE: Pro Brain Natriuretic Peptide: <50 pg/mL

## 2024-02-09 MED ORDER — BENZONATATE 200 MG PO CAPS
200.0000 mg | ORAL_CAPSULE | Freq: Three times a day (TID) | ORAL | 0 refills | Status: AC | PRN
  Filled 2024-02-09 (×3): qty 20, 7d supply, fill #0

## 2024-02-09 MED ORDER — FLUTICASONE PROPIONATE 50 MCG/ACT NA SUSP
2.0000 | Freq: Every day | NASAL | 0 refills | Status: AC
  Filled 2024-02-09: qty 48, 90d supply, fill #0

## 2024-02-09 MED ORDER — METHYLPREDNISOLONE 4 MG PO TABS
ORAL_TABLET | ORAL | 0 refills | Status: AC
  Filled 2024-02-09 (×3): qty 11, 5d supply, fill #0

## 2024-02-09 NOTE — Progress Notes (Unsigned)
 [facility] Cancer Follow up:    Joshua Santana CROME, NP [address] [place] [address]   DIAGNOSIS: Cancer Staging  Malignant neoplasm of lower-outer quadrant of left breast of female, estrogen receptor negative (HCC) Staging form: Breast, AJCC 8th Edition - Clinical: Stage IIB (cT2, cN1, cM0, G3, ER-, PR-, HER2+) - Signed by Loretha Ash, MD on 11/22/2023 Histologic grading system: 3 grade system    SUMMARY OF ONCOLOGIC HISTORY: Oncology History  Malignant neoplasm of lower-outer quadrant of left breast of female, estrogen receptor negative (HCC)  11/20/2023 Initial Diagnosis   Malignant neoplasm of lower-outer quadrant of left breast of female, estrogen receptor negative (HCC)   11/22/2023 Cancer Staging   Staging form: Breast, AJCC 8th Edition - Clinical: Stage IIB (cT2, cN1, cM0, G3, ER-, PR-, HER2+) - Signed by Loretha Ash, MD on 11/22/2023 Histologic grading system: 3 grade system   12/13/2023 -  Chemotherapy   Patient is on Treatment Plan : BREAST  Docetaxel  + Carboplatin  + Trastuzumab  + Pertuzumab  (TCHP) q21d        CURRENT THERAPY:  INTERVAL HISTORY:  Discussed the use of AI scribe software for clinical note transcription with the patient, who gave verbal consent to proceed.  History of Present Illness      Patient Active Problem List   Diagnosis Date Noted   Port-A-Cath in place 01/25/2024   Malignant neoplasm of lower-outer quadrant of left breast of female, estrogen receptor negative (HCC) 11/20/2023   Acute diastolic heart failure (HCC) 05/31/2023   HTN (hypertension) 05/21/2020   Low back pain 04/29/2015   Lumbar facet arthropathy 04/29/2015   Trochanteric bursitis of both hips 04/29/2015   Morbid obesity (HCC) 04/29/2015    is allergic to penicillins.  MEDICAL HISTORY: Past Medical History:  Diagnosis Date   Arthritis    Breast cancer (HCC)    CHF (congestive heart failure) (HCC)    Hypertension      SURGICAL HISTORY: Past Surgical History:  Procedure Laterality Date   ABDOMINAL HYSTERECTOMY     APPENDECTOMY     COSMETIC SURGERY     mass removed from shoulder/scapula area   IR IMAGING GUIDED PORT INSERTION  12/06/2023    SOCIAL HISTORY: Social History   Socioeconomic History   Marital status: Divorced    Spouse name: Not on file   Number of children: Not on file   Years of education: Not on file   Highest education level: Not on file  Occupational History   Not on file  Tobacco Use   Smoking status: Never   Smokeless tobacco: Never  Vaping Use   Vaping status: Never Used  Substance and Sexual Activity   Alcohol  use: Yes    Comment: ocially   Drug use: No   Sexual activity: Not on file  Other Topics Concern   Not on file  Social History Narrative   Not on file   Social Drivers of Health   Tobacco Use: Low Risk (02/09/2024)   Patient History    Smoking Tobacco Use: Never    Smokeless Tobacco Use: Never    Passive Exposure: Not on file  Financial Resource Strain: Not on file  Food Insecurity: No Food Insecurity (11/23/2023)   Epic    Worried About Programme Researcher, Broadcasting/film/video in the Last Year: Never true    Ran Out of Food in the Last Year: Never true  Transportation Needs: No Transportation Needs (11/23/2023)   Epic  Lack of Transportation (Medical): No    Lack of Transportation (Non-Medical): No  Physical Activity: Inactive (07/27/2023)   Received from [facility]   Exercise Vital Sign    On average, how many days per week do you engage in moderate to strenuous exercise (like a brisk walk)?: 0 days    On average, how many minutes do you engage in exercise at this level?: 0 min  Stress: Not on file  Social Connections: Unknown (06/01/2023)   Social Connection and Isolation Panel    Frequency of Communication with Friends and Family: More than three times a week    Frequency of Social Gatherings with Friends and Family: Not on file    Attends  Religious Services: Not on file    Active Member of Clubs or Organizations: Not on file    Attends Club or Organization Meetings: Not on file    Marital Status: Not on file  Intimate Partner Violence: Not At Risk (06/01/2023)   Humiliation, Afraid, Rape, and Kick questionnaire    Fear of Current or Ex-Partner: No    Emotionally Abused: No    Physically Abused: No    Sexually Abused: No  Depression (PHQ2-9): Low Risk (01/25/2024)   Depression (PHQ2-9)    PHQ-2 Score: 0  Alcohol  Screen: Not on file  Housing: Low Risk (11/23/2023)   Epic    Unable to Pay for Housing in the Last Year: No    Number of Times Moved in the Last Year: 0    Homeless in the Last Year: No  Utilities: Not At Risk (11/23/2023)   Epic    Threatened with loss of utilities: No  Health Literacy: Not on file    FAMILY HISTORY: Family History  Problem Relation Age of Onset   Alcohol  abuse Maternal Grandmother    Autism spectrum disorder Sister    Breast cancer Mother    Depression Sister    Hypertension Mother    Hypertension Sister    Osteoarthritis Maternal Grandmother     Review of Systems - Oncology    PHYSICAL EXAMINATION    Vitals:   02/09/24 1246  BP: (!) 217/99  Pulse: 84  Resp: 20  Temp: 98.7 F (37.1 C)  SpO2: 95%    Physical Exam  LABORATORY DATA:  CBC    Component Value Date/Time   WBC 8.7 02/09/2024 1152   WBC 7.4 06/02/2023 0245   RBC 4.70 02/09/2024 1152   HGB 12.3 02/09/2024 1152   HCT 37.2 02/09/2024 1152   PLT 150 02/09/2024 1152   MCV 79.1 (L) 02/09/2024 1152   MCH 26.2 02/09/2024 1152   MCHC 33.1 02/09/2024 1152   RDW 22.4 (H) 02/09/2024 1152   LYMPHSABS 1.7 02/09/2024 1152   MONOABS 1.0 02/09/2024 1152   EOSABS 0.0 02/09/2024 1152   BASOSABS 0.1 02/09/2024 1152    CMP     Component Value Date/Time   NA 142 02/09/2024 1152   K 3.5 02/09/2024 1152   CL 101 02/09/2024 1152   CO2 30 02/09/2024 1152   GLUCOSE 112 (H) 02/09/2024 1152   BUN 10 02/09/2024  1152   CREATININE 0.72 02/09/2024 1152   CALCIUM  9.8 02/09/2024 1152   PROT 7.5 02/09/2024 1152   ALBUMIN 4.2 02/09/2024 1152   AST 22 02/09/2024 1152   ALT 23 02/09/2024 1152   ALKPHOS 138 (H) 02/09/2024 1152   BILITOT 0.4 02/09/2024 1152   GFRNONAA >60 02/09/2024 1152     ASSESSMENT and THERAPY PLAN:  No problem-specific Assessment & Plan notes found for this encounter.   Assessment and Plan Assessment & Plan       All questions were answered. The patient knows to call the clinic with any problems, questions or concerns. We can certainly see the patient much sooner if necessary.  Total encounter time:*** minutes*in face-to-face visit time, chart review, lab review, care coordination, order entry, and documentation of the encounter time.    Morna Kendall, NP 02/09/24 12:55 PM Medical Oncology and Hematology [facility] [address] [place], [place] [address] Tel. [phone]    Fax. [phone]  *Total Encounter Time as defined by the Centers for Medicare and Medicaid Services includes, in addition to the face-to-face time of a patient visit (documented in the note above) non-face-to-face time: obtaining and reviewing outside history, ordering and reviewing medications, tests or procedures, care coordination (communications with other health care professionals or caregivers) and documentation in the medical record.

## 2024-02-12 ENCOUNTER — Encounter: Payer: Self-pay | Admitting: Hematology and Oncology

## 2024-02-15 ENCOUNTER — Inpatient Hospital Stay: Attending: Licensed Clinical Social Worker

## 2024-02-15 ENCOUNTER — Inpatient Hospital Stay: Admitting: Hematology and Oncology

## 2024-02-15 ENCOUNTER — Other Ambulatory Visit: Payer: Self-pay

## 2024-02-15 ENCOUNTER — Inpatient Hospital Stay

## 2024-02-15 VITALS — BP 132/89 | HR 74 | Temp 97.6°F | Resp 17 | Wt 341.4 lb

## 2024-02-15 VITALS — BP 152/79 | HR 65 | Resp 19

## 2024-02-15 DIAGNOSIS — Z171 Estrogen receptor negative status [ER-]: Secondary | ICD-10-CM

## 2024-02-15 DIAGNOSIS — R0600 Dyspnea, unspecified: Secondary | ICD-10-CM

## 2024-02-15 DIAGNOSIS — Z5181 Encounter for therapeutic drug level monitoring: Secondary | ICD-10-CM

## 2024-02-15 DIAGNOSIS — C50512 Malignant neoplasm of lower-outer quadrant of left female breast: Secondary | ICD-10-CM

## 2024-02-15 LAB — CMP (CANCER CENTER ONLY)
ALT: 20 U/L (ref 0–44)
AST: 18 U/L (ref 15–41)
Albumin: 4.3 g/dL (ref 3.5–5.0)
Alkaline Phosphatase: 102 U/L (ref 38–126)
Anion gap: 9 (ref 5–15)
BUN: 12 mg/dL (ref 8–23)
CO2: 31 mmol/L (ref 22–32)
Calcium: 9.7 mg/dL (ref 8.9–10.3)
Chloride: 101 mmol/L (ref 98–111)
Creatinine: 0.65 mg/dL (ref 0.44–1.00)
GFR, Estimated: >60 mL/min
Glucose, Bld: 104 mg/dL — ABNORMAL HIGH (ref 70–99)
Potassium: 3.5 mmol/L (ref 3.5–5.1)
Sodium: 140 mmol/L (ref 135–145)
Total Bilirubin: 0.5 mg/dL (ref 0.0–1.2)
Total Protein: 7.6 g/dL (ref 6.5–8.1)

## 2024-02-15 LAB — CBC WITH DIFFERENTIAL (CANCER CENTER ONLY)
Abs Immature Granulocytes: 0.01 10*3/uL (ref 0.00–0.07)
Basophils Absolute: 0.1 10*3/uL (ref 0.0–0.1)
Basophils Relative: 1 %
Eosinophils Absolute: 0 10*3/uL (ref 0.0–0.5)
Eosinophils Relative: 1 %
HCT: 37.7 % (ref 36.0–46.0)
Hemoglobin: 12.5 g/dL (ref 12.0–15.0)
Immature Granulocytes: 0 %
Lymphocytes Relative: 29 %
Lymphs Abs: 1.8 10*3/uL (ref 0.7–4.0)
MCH: 26.5 pg (ref 26.0–34.0)
MCHC: 33.2 g/dL (ref 30.0–36.0)
MCV: 79.9 fL — ABNORMAL LOW (ref 80.0–100.0)
Monocytes Absolute: 1 10*3/uL (ref 0.1–1.0)
Monocytes Relative: 15 %
Neutro Abs: 3.4 10*3/uL (ref 1.7–7.7)
Neutrophils Relative %: 54 %
Platelet Count: 196 10*3/uL (ref 150–400)
RBC: 4.72 MIL/uL (ref 3.87–5.11)
RDW: 22 % — ABNORMAL HIGH (ref 11.5–15.5)
WBC Count: 6.2 10*3/uL (ref 4.0–10.5)
nRBC: 0 % (ref 0.0–0.2)

## 2024-02-15 MED ORDER — DEXAMETHASONE SOD PHOSPHATE PF 10 MG/ML IJ SOLN
10.0000 mg | Freq: Once | INTRAMUSCULAR | Status: AC
  Administered 2024-02-15: 10 mg via INTRAVENOUS
  Filled 2024-02-15: qty 1

## 2024-02-15 MED ORDER — SODIUM CHLORIDE 0.9 % IV SOLN: INTRAVENOUS | Status: DC

## 2024-02-15 MED ORDER — TRASTUZUMAB-ANNS CHEMO 150 MG IV SOLR
950.0000 mg | Freq: Once | INTRAVENOUS | Status: AC
  Administered 2024-02-15: 950 mg via INTRAVENOUS
  Filled 2024-02-15: qty 45.24

## 2024-02-15 MED ORDER — ACETAMINOPHEN 325 MG PO TABS
650.0000 mg | ORAL_TABLET | Freq: Once | ORAL | Status: AC
  Administered 2024-02-15: 650 mg via ORAL
  Filled 2024-02-15: qty 2

## 2024-02-15 MED ORDER — SODIUM CHLORIDE 0.9 % IV SOLN
50.0000 mg/m2 | Freq: Once | INTRAVENOUS | Status: AC
  Administered 2024-02-15: 132 mg via INTRAVENOUS
  Filled 2024-02-15: qty 13.2

## 2024-02-15 MED ORDER — SODIUM CHLORIDE 0.9 % IV SOLN
600.0000 mg | Freq: Once | INTRAVENOUS | Status: AC
  Administered 2024-02-15: 600 mg via INTRAVENOUS
  Filled 2024-02-15: qty 60

## 2024-02-15 MED ORDER — DIPHENHYDRAMINE HCL 25 MG PO CAPS
50.0000 mg | ORAL_CAPSULE | Freq: Once | ORAL | Status: AC
  Administered 2024-02-15: 50 mg via ORAL
  Filled 2024-02-15: qty 2

## 2024-02-15 MED ORDER — PALONOSETRON HCL INJECTION 0.25 MG/5ML
0.2500 mg | Freq: Once | INTRAVENOUS | Status: AC
  Administered 2024-02-15: 0.25 mg via INTRAVENOUS
  Filled 2024-02-15: qty 5

## 2024-02-15 MED ORDER — APREPITANT 130 MG/18ML IV EMUL
130.0000 mg | Freq: Once | INTRAVENOUS | Status: AC
  Administered 2024-02-15: 130 mg via INTRAVENOUS
  Filled 2024-02-15: qty 18

## 2024-02-15 NOTE — Progress Notes (Signed)
 Decrease Kanjinti  to 950 mg today and for future cycles per Dr. Loretha due to weight loss.  Harlene Nasuti, PharmD Oncology Infusion Pharmacist 02/15/2024 11:42 AM

## 2024-02-15 NOTE — Progress Notes (Signed)
 "     [facility]   [phone] [phone]    Patient Care Team: Joshua Santana CROME, NP as PCP - General (Nurse Practitioner) Tyree Nanetta SAILOR, RN as Oncology Nurse Navigator Vernetta Berg, MD as Consulting Physician (General Surgery) Loretha Ash, MD as Consulting Physician (Hematology and Oncology) Shannon Agent, MD as Consulting Physician (Radiation Oncology)   CHIEF COMPLAINT: Follow up HER2 + breast cancer   CURRENT THERAPY: Neoadjuvant TCH with 25% dose reduction; starting 12/13/23  INTERVAL HISTORY   Discussed the use of AI scribe software for clinical note transcription with the patient, who gave verbal consent to proceed.  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  She is currently undergoing neoadjuvant TCH chemotherapy for HER2-positive, ER-negative left breast cancer, with cycle 4 of 6 administered today at a 25% dose reduction. She reports overall good tolerance of chemotherapy, with minimal gastrointestinal side effects. She experiences dysgeusia and significant fatigue for approximately one and a half weeks following each cycle, after which her energy returns. During this period, she also notes transient confusion and forgetfulness, which is apparent to her family. She denies persistent nausea, vomiting, or diarrhea. She does not perform self-breast exams and relies on clinical evaluation for monitoring.  She continues to experience chronic cough, dysphonia, and intermittent dyspnea, which she attributes to a prior episode of bronchiolitis following COVID-19 infection. These respiratory symptoms preceded her breast cancer diagnosis and have persisted despite pulmonology evaluation and inhaler therapy. She has not noted recent  weight gain and instead reports a decrease in weight from 350 to 341 pounds since her last visit. She has not previously tried allergy medications and has not used mouth sprays for her dry throat, preferring salt water or baking soda rinses as recommended by her family.  Jan 25, 2024: Oncology follow-up during neoadjuvant [facility] chemotherapy (25% dose reduction) for HER2-positive, ER-negative left breast cancer; halfway through six planned cycles. Reported significant fatigue, dysgeusia, and decreased appetite, but maintained hydration and had weight gain likely due to fluid retention. Left breast mass no longer palpable, indicating clinical response; mild anemia noted, no neuropathy or severe toxicity, and surgery planned one month after chemotherapy completion.   Past Medical History:  Diagnosis Date   Arthritis    Breast cancer (HCC)    CHF (congestive heart failure) (HCC)    Hypertension      Past Surgical History:  Procedure Laterality Date   ABDOMINAL HYSTERECTOMY     APPENDECTOMY     COSMETIC SURGERY     mass removed from shoulder/scapula area   IR IMAGING GUIDED PORT INSERTION  12/06/2023     Outpatient Encounter Medications as of 02/15/2024  Medication Sig Note   albuterol  (PROVENTIL ) (2.5 MG/3ML) 0.083% nebulizer solution Inhale 3 mL (2.5 mg total) into the lungs via nebulization every 8 (eight) hours as needed for wheezing or shortness of breath.    albuterol  (VENTOLIN  HFA) 108 (90 Base) MCG/ACT inhaler Inhale 2 puffs into the lungs every 6 (six) hours as needed. 05/31/2023: Unable to remove duplicate entry without DC refills.    albuterol  (VENTOLIN  HFA) 108 (90 Base) MCG/ACT inhaler Inhale 2 puffs into the lungs every 6 (six) hours as needed for wheezing (cough).    albuterol  (VENTOLIN  HFA) 108 (90 Base) MCG/ACT inhaler Inhale 2 puffs every 6 (six) hours as needed  for wheezing (cough).    amLODipine  (NORVASC ) 10 MG tablet Take 1 tablet (10 mg total) by mouth daily.    BACILLUS  COAGULANS-INULIN PO Take 1 tablet by mouth daily. 12/06/2023: Hasn't started   benzonatate  (TESSALON ) 200 MG capsule Take 1 capsule (200 mg total) by mouth 3 (three) times daily as needed for cough.    budesonide -glycopyrrolate -formoterol  (BREZTRI  AEROSPHERE) 160-9-4.8 MCG/ACT AERO inhaler Inhale 2 puffs into the lungs 2 (two) times daily.    carvedilol  (COREG ) 12.5 MG tablet Take 1 tablet (12.5 mg total) by mouth 2 (two) times daily in the morning and evening with a meal.    dexamethasone  (DECADRON ) 4 MG tablet Take 2 tabs by mouth 2 times daily starting day before chemo. Then take 2 tabs daily for 2 days starting day after chemo. Take with food. 12/06/2023: Hasn't started   diclofenac  Sodium (VOLTAREN ) 1 % GEL Apply 1 g topically 3 (three) times a day.    ergocalciferol (VITAMIN D2) 1.25 MG (50000 UT) capsule Take 50,000 Units by mouth once a week.    fluticasone  (FLONASE ) 50 MCG/ACT nasal spray Administer 2 sprays in each nostril daily. 12/06/2023: Hasn't started   fluticasone  (FLONASE ) 50 MCG/ACT nasal spray Administer 2 sprays in each nostril daily.    furosemide  (LASIX ) 40 MG tablet Take 1 tablet (40 mg total) by mouth daily.    lidocaine -prilocaine  (EMLA ) cream Apply to affected area once as directed. 12/06/2023: Hasn't started   magic mouthwash (nystatin , lidocaine , diphenhydrAMINE , alum & mag hydroxide) suspension Take 5 mLs by mouth 4 (four) times daily as needed for mouth pain.    nystatin  (MYCOSTATIN ) 100000 UNIT/ML suspension Take by mouth.    olmesartan  (BENICAR ) 40 MG tablet Take 1 tablet (40 mg total) by mouth daily.    ondansetron  (ZOFRAN ) 8 MG tablet Take 1 tablet (8 mg total) by mouth every 8 (eight) hours as needed for nausea or vomiting. Start on the third day after chemotherapy. 12/06/2023: Hasn't started   OVER THE COUNTER MEDICATION Take 2 capsules by mouth daily. FISH OIL OMEGA 3 VIT C VIT E 2000-650-12MG     pantoprazole  (PROTONIX ) 40 MG tablet Take 1 tablet (40 mg total) by  mouth daily.    potassium chloride  SA (KLOR-CON  M) 20 MEQ tablet Take 2 tablets (40 mEq total) by mouth daily.    pravastatin  (PRAVACHOL ) 20 MG tablet Take 1 tablet (20 mg total) by mouth nightly.    prednisoLONE  acetate (PRED FORTE ) 1 % ophthalmic suspension Place 1 drop into the right eye 4 (four) times daily.    prochlorperazine  (COMPAZINE ) 10 MG tablet Take 1 tablet (10 mg total) by mouth every 6 (six) hours as needed for nausea or vomiting. 12/06/2023: Hasn't started   Spacer/Aero-Holding Raguel DEVI Use as directed    traMADol  (ULTRAM ) 50 MG tablet Take 1 tablet (50 mg total) by mouth every 6 (six) hours as needed for moderate pain (4-6)    traZODone  (DESYREL ) 50 MG tablet Take 1 tablet (50 mg total) by mouth at bedtime. *stop zolpidem *    VEVYE 0.1 % SOLN Place 1 drop into both eyes 2 (two) times daily.    zolpidem  (AMBIEN  CR) 6.25 MG CR tablet Take 1 tablet (6.25 mg total) by mouth at bedtime as needed for sleep    Ascorbic Acid  (VITAMIN C CR) 500 MG CPCR Take 500 mg by mouth daily. (Patient not taking: Reported on 02/15/2024)    budesonide -formoterol  (SYMBICORT ) 160-4.5 MCG/ACT inhaler Inhale 2 puffs into the lungs in the morning  and at bedtime. (Patient not taking: Reported on 02/15/2024) 12/06/2023: Not using causes her to wheeze, her doctor is aware   [DISCONTINUED] lisinopril  (ZESTRIL ) 20 MG tablet Take 1 tablet (20 mg total) by mouth daily.    No facility-administered encounter medications on file as of 02/15/2024.     Today's Vitals   02/15/24 1000  PainSc: 0-No pain   There is no height or weight on file to calculate BMI.   ECOG PERFORMANCE STATUS: 1 - Symptomatic but completely ambulatory   Physical Exam Constitutional:      Appearance: Normal appearance.  Cardiovascular:     Rate and Rhythm: Normal rate and regular rhythm.     Pulses: Normal pulses.     Heart sounds: Normal heart sounds.  Pulmonary:     Effort: Pulmonary effort is normal.     Breath sounds: Normal  breath sounds.  Chest:     Comments: No palpable breast mass. No regional adenopathy Musculoskeletal:        General: No swelling. Normal range of motion.     Cervical back: Normal range of motion and neck supple. No rigidity.  Lymphadenopathy:     Cervical: No cervical adenopathy.  Skin:    General: Skin is warm and dry.  Neurological:     General: No focal deficit present.     Mental Status: She is alert.      CBC    Latest Ref Rng & Units 02/15/2024    9:57 AM 02/09/2024   11:52 AM 01/25/2024    9:42 AM  CBC  WBC 4.0 - 10.5 K/uL 6.2  8.7  5.3   Hemoglobin 12.0 - 15.0 g/dL 87.4  87.6  88.1   Hematocrit 36.0 - 46.0 % 37.7  37.2  35.7   Platelets 150 - 400 K/uL 196  150  268       CMP     Latest Ref Rng & Units 02/09/2024   11:52 AM 01/25/2024    9:42 AM 01/02/2024    8:46 AM  CMP  Glucose 70 - 99 mg/dL 887  894  848   BUN 8 - 23 mg/dL 10  13  14    Creatinine 0.44 - 1.00 mg/dL 9.27  9.23  9.30   Sodium 135 - 145 mmol/L 142  143  141   Potassium 3.5 - 5.1 mmol/L 3.5  3.7  4.1   Chloride 98 - 111 mmol/L 101  104  103   CO2 22 - 32 mmol/L 30  29  30    Calcium  8.9 - 10.3 mg/dL 9.8  9.5  9.9   Total Protein 6.5 - 8.1 g/dL 7.5  7.3  8.1   Total Bilirubin 0.0 - 1.2 mg/dL 0.4  0.4  0.5   Alkaline Phos 38 - 126 U/L 138  117  146   AST 15 - 41 U/L 22  25  21    ALT 0 - 44 U/L 23  31  21        ASSESSMENT & PLAN:  Assessment & Plan Estrogen receptor negative malignant neoplasm of lower-outer quadrant of left breast  Undergoing neoadjuvant chemotherapy for estrogen receptor negative invasive carcinoma. Completed 4 of 6 cycles with favorable response; mass no longer palpable. Tolerating therapy well with mild side effects.  - Schedule next two chemotherapy treatments. - Will repeat breast imaging after completion of 6 cycles. - So far with dose reduction, she has tolerated chemo well and there appears to be excellent response so far.  History of bronchiolitis with residual  symptoms Chronic cough, raspy voice, and intermittent dyspnea persist post-bronchiolitis.  - Recommended trial of levocetirizine daily for two weeks for possible allergic contribution. - Advised salt water or baking soda gargles for throat discomfort. - Discussed option to refer to a different pulmonologist if symptoms persist after allergy trial. - Confirmed continuation of inhaler therapy as previously prescribed.  Time spent: 30 min  No orders of the defined types were placed in this encounter.   Amber Stalls, MD 02/15/2024   "

## 2024-02-15 NOTE — Patient Instructions (Signed)
 [facility] - A DEPT OF [facility]  Discharge Instructions: Thank you for choosing [facility] to provide your oncology and hematology care.   If you have a lab appointment with the Cancer Center, please go directly to the Cancer Center and check in at the registration area.   Wear comfortable clothing and clothing appropriate for easy access to any Portacath or PICC line.   We strive to give you quality time with your provider. You may need to reschedule your appointment if you arrive late (15 or more minutes).  Arriving late affects you and other patients whose appointments are after yours.  Also, if you miss three or more appointments without notifying the office, you may be dismissed from the clinic at the providers discretion.      For prescription refill requests, have your pharmacy contact our office and allow 72 hours for refills to be completed.    Today you received the following chemotherapy and/or immunotherapy agents: Trastuzumab  (Kanjinti ), Docetaxel  (Taxotere ), & Carboplatin  (Paraplatin )      To help prevent nausea and vomiting after your treatment, we encourage you to take your nausea medication as directed.  BELOW ARE SYMPTOMS THAT SHOULD BE REPORTED IMMEDIATELY: *FEVER GREATER THAN 100.4 F (38 C) OR HIGHER *CHILLS OR SWEATING *NAUSEA AND VOMITING THAT IS NOT CONTROLLED WITH YOUR NAUSEA MEDICATION *UNUSUAL SHORTNESS OF BREATH *UNUSUAL BRUISING OR BLEEDING *URINARY PROBLEMS (pain or burning when urinating, or frequent urination) *BOWEL PROBLEMS (unusual diarrhea, constipation, pain near the anus) TENDERNESS IN MOUTH AND THROAT WITH OR WITHOUT PRESENCE OF ULCERS (sore throat, sores in mouth, or a toothache) UNUSUAL RASH, SWELLING OR PAIN  UNUSUAL VAGINAL DISCHARGE OR ITCHING   Items with * indicate a potential emergency and should be followed up as soon as possible or go to the Emergency Department if any problems should  occur.  Please show the CHEMOTHERAPY ALERT CARD or IMMUNOTHERAPY ALERT CARD at check-in to the Emergency Department and triage nurse.  Should you have questions after your visit or need to cancel or reschedule your appointment, please contact [facility] - A DEPT OF [facility]  Dept: [phone]  and follow the prompts.  Office hours are 8:00 a.m. to 4:30 p.m. Monday - Friday. Please note that voicemails left after 4:00 p.m. may not be returned until the following business day.  We are closed weekends and major holidays. You have access to a nurse at all times for urgent questions. Please call the main number to the clinic Dept: [phone] and follow the prompts.   For any non-urgent questions, you may also contact your provider using MyChart. We now offer e-Visits for anyone 43 and older to request care online for non-urgent symptoms. For details visit [email or web address].   Also download the MyChart app! Go to the app store, search MyChart, open the app, select [facility], and log in with your MyChart username and password.

## 2024-02-15 NOTE — Progress Notes (Signed)
 Verbal order placed per MD for echo complete d/t cardiotoxic drug therapy.  Expected date one week.  Must be completed before next tx.

## 2024-02-16 ENCOUNTER — Telehealth: Payer: Self-pay

## 2024-02-16 NOTE — Telephone Encounter (Signed)
 Spoke with pt in regards to her disability forms being completed,faxed, and confirmation received. Pt verbalized understanding, and will pick her up her copy for her records.

## 2024-02-17 ENCOUNTER — Inpatient Hospital Stay

## 2024-02-20 ENCOUNTER — Ambulatory Visit (HOSPITAL_BASED_OUTPATIENT_CLINIC_OR_DEPARTMENT_OTHER)

## 2024-03-08 ENCOUNTER — Inpatient Hospital Stay

## 2024-03-08 ENCOUNTER — Inpatient Hospital Stay: Admitting: Hematology and Oncology

## 2024-03-11 ENCOUNTER — Inpatient Hospital Stay: Attending: Licensed Clinical Social Worker

## 2024-03-27 ENCOUNTER — Inpatient Hospital Stay: Admitting: Adult Health

## 2024-03-27 ENCOUNTER — Inpatient Hospital Stay

## 2024-03-29 ENCOUNTER — Inpatient Hospital Stay
